# Patient Record
Sex: Male | Born: 1940 | Race: Black or African American | Hispanic: No | Marital: Single | Smoking: Former smoker
Health system: Southern US, Community
[De-identification: ages and names within clinical notes are randomized; demographics above are authoritative.]

## PROBLEM LIST (undated history)

## (undated) ENCOUNTER — Ambulatory Visit: Payer: BLUE CROSS/BLUE SHIELD

## (undated) ENCOUNTER — Encounter

## (undated) ENCOUNTER — Ambulatory Visit

## (undated) ENCOUNTER — Ambulatory Visit: Payer: MEDICARE

## (undated) ENCOUNTER — Telehealth

## (undated) ENCOUNTER — Encounter
Attending: Student in an Organized Health Care Education/Training Program | Primary: Student in an Organized Health Care Education/Training Program

## (undated) ENCOUNTER — Ambulatory Visit: Payer: Medicare (Managed Care)

## (undated) ENCOUNTER — Ambulatory Visit: Payer: 59

## (undated) ENCOUNTER — Emergency Department: Admission: EM | Payer: Medicare HMO | Source: Home / Self Care

## (undated) DIAGNOSIS — I8500 Esophageal varices without bleeding: Secondary | ICD-10-CM

## (undated) DIAGNOSIS — I10 Essential (primary) hypertension: Secondary | ICD-10-CM

## (undated) DIAGNOSIS — K219 Gastro-esophageal reflux disease without esophagitis: Secondary | ICD-10-CM

## (undated) DIAGNOSIS — K226 Gastro-esophageal laceration-hemorrhage syndrome: Secondary | ICD-10-CM

## (undated) DIAGNOSIS — D126 Benign neoplasm of colon, unspecified: Secondary | ICD-10-CM

## (undated) DIAGNOSIS — I509 Heart failure, unspecified: Secondary | ICD-10-CM

## (undated) DIAGNOSIS — I5020 Unspecified systolic (congestive) heart failure: Secondary | ICD-10-CM

## (undated) DIAGNOSIS — L309 Dermatitis, unspecified: Secondary | ICD-10-CM

## (undated) DIAGNOSIS — I428 Other cardiomyopathies: Secondary | ICD-10-CM

## (undated) DIAGNOSIS — E785 Hyperlipidemia, unspecified: Secondary | ICD-10-CM

## (undated) DIAGNOSIS — D649 Anemia, unspecified: Secondary | ICD-10-CM

## (undated) DIAGNOSIS — I2510 Atherosclerotic heart disease of native coronary artery without angina pectoris: Secondary | ICD-10-CM

## (undated) HISTORY — DX: Unspecified systolic (congestive) heart failure: I50.20

## (undated) HISTORY — DX: Other cardiomyopathies: I42.8

## (undated) HISTORY — PX: HERNIA REPAIR: SHX51

## (undated) HISTORY — PX: CARDIAC CATHETERIZATION: SHX172

## (undated) HISTORY — PX: REPLACEMENT TOTAL KNEE BILATERAL: SUR1225

## (undated) HISTORY — DX: Essential (primary) hypertension: I10

## (undated) HISTORY — PX: PROSTATE SURGERY: SHX751

## (undated) HISTORY — PX: JOINT REPLACEMENT: SHX530

## (undated) HISTORY — PX: ESOPHAGOGASTRODUODENOSCOPY: SHX1529

## (undated) HISTORY — PX: COLONOSCOPY: SHX174

## (undated) HISTORY — DX: Atherosclerotic heart disease of native coronary artery without angina pectoris: I25.10

---
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
## 2008-05-09 ENCOUNTER — Ambulatory Visit: Payer: Self-pay | Admitting: General Surgery

## 2009-08-25 ENCOUNTER — Ambulatory Visit: Payer: Self-pay | Admitting: Gastroenterology

## 2010-03-19 ENCOUNTER — Ambulatory Visit: Payer: Self-pay | Admitting: General Practice

## 2010-03-26 ENCOUNTER — Ambulatory Visit: Payer: Self-pay | Admitting: General Practice

## 2010-04-02 ENCOUNTER — Inpatient Hospital Stay: Payer: Self-pay | Admitting: General Practice

## 2010-04-04 ENCOUNTER — Encounter: Payer: Self-pay | Admitting: Internal Medicine

## 2010-09-13 ENCOUNTER — Ambulatory Visit: Payer: Self-pay | Admitting: General Practice

## 2010-09-26 ENCOUNTER — Inpatient Hospital Stay: Payer: Self-pay | Admitting: General Practice

## 2010-09-30 ENCOUNTER — Encounter: Payer: Self-pay | Admitting: Internal Medicine

## 2014-01-23 ENCOUNTER — Inpatient Hospital Stay: Payer: Self-pay | Admitting: Internal Medicine

## 2014-01-23 LAB — COMPREHENSIVE METABOLIC PANEL
Albumin: 3.5 g/dL (ref 3.4–5.0)
Alkaline Phosphatase: 79 U/L
Anion Gap: 11 (ref 7–16)
BUN: 25 mg/dL — ABNORMAL HIGH (ref 7–18)
Bilirubin,Total: 0.8 mg/dL (ref 0.2–1.0)
Calcium, Total: 8.8 mg/dL (ref 8.5–10.1)
Chloride: 104 mmol/L (ref 98–107)
Co2: 25 mmol/L (ref 21–32)
Creatinine: 1.24 mg/dL (ref 0.60–1.30)
EGFR (African American): >60
EGFR (Non-African Amer.): 59 — ABNORMAL LOW
Glucose: 146 mg/dL — ABNORMAL HIGH (ref 65–99)
Osmolality: 297 (ref 275–301)
Potassium: 3.4 mmol/L — ABNORMAL LOW (ref 3.5–5.1)
SGOT(AST): 28 U/L (ref 15–37)
SGPT (ALT): 35 U/L (ref 12–78)
Sodium: 140 mmol/L (ref 136–145)
Total Protein: 7.1 g/dL (ref 6.4–8.2)

## 2014-01-23 LAB — CBC
HCT: 42.7 % (ref 40.0–52.0)
HGB: 14.4 g/dL (ref 13.0–18.0)
MCH: 31 pg (ref 26.0–34.0)
MCHC: 33.8 g/dL (ref 32.0–36.0)
MCV: 92 fL (ref 80–100)
Platelet: 235 10*3/uL (ref 150–440)
RBC: 4.66 10*6/uL (ref 4.40–5.90)
RDW: 13.3 % (ref 11.5–14.5)
WBC: 13.6 10*3/uL — ABNORMAL HIGH (ref 3.8–10.6)

## 2014-01-23 LAB — PROTIME-INR
INR: 1
Prothrombin Time: 13.2 s

## 2014-01-23 LAB — ETHANOL
Ethanol %: <0.003 %
Ethanol: <3 mg/dL

## 2014-01-23 LAB — LIPASE, BLOOD: Lipase: 105 U/L

## 2014-01-23 LAB — HEMATOCRIT
HCT: 38.5 % — ABNORMAL LOW (ref 40.0–52.0)
HCT: 38.1 % — ABNORMAL LOW (ref 40.0–52.0)
HCT: 36.9 % — ABNORMAL LOW (ref 40.0–52.0)

## 2014-01-23 LAB — CK TOTAL AND CKMB (NOT AT ARMC)
CK, Total: 232 U/L (ref 35–232)
CK-MB: 2.6 ng/mL (ref 0.5–3.6)

## 2014-01-23 LAB — HEMOGLOBIN
HGB: 13.2 g/dL
HGB: 13.1 g/dL
HGB: 12.5 g/dL — ABNORMAL LOW

## 2014-01-23 LAB — APTT: Activated PTT: 33.7 secs (ref 23.6–35.9)

## 2014-01-23 LAB — TROPONIN I: Troponin-I: <0.02 ng/mL

## 2014-01-24 LAB — COMPREHENSIVE METABOLIC PANEL
Albumin: 2.8 g/dL — ABNORMAL LOW (ref 3.4–5.0)
Alkaline Phosphatase: 60 U/L
Anion Gap: 1 — ABNORMAL LOW (ref 7–16)
BUN: 21 mg/dL — ABNORMAL HIGH (ref 7–18)
Bilirubin,Total: 0.6 mg/dL (ref 0.2–1.0)
Calcium, Total: 8.2 mg/dL — ABNORMAL LOW (ref 8.5–10.1)
Chloride: 106 mmol/L (ref 98–107)
Co2: 30 mmol/L (ref 21–32)
Creatinine: 1.13 mg/dL (ref 0.60–1.30)
EGFR (African American): >60
EGFR (Non-African Amer.): >60
Glucose: 115 mg/dL — ABNORMAL HIGH (ref 65–99)
Osmolality: 278 (ref 275–301)
Potassium: 3.8 mmol/L (ref 3.5–5.1)
SGOT(AST): 28 U/L (ref 15–37)
SGPT (ALT): 23 U/L (ref 12–78)
Sodium: 137 mmol/L (ref 136–145)
Total Protein: 5.7 g/dL — ABNORMAL LOW (ref 6.4–8.2)

## 2014-01-24 LAB — CBC WITH DIFFERENTIAL/PLATELET
Basophil #: 0 10*3/uL (ref 0.0–0.1)
Basophil %: 0.7 %
Eosinophil #: 0.1 10*3/uL (ref 0.0–0.7)
Eosinophil %: 1.6 %
HCT: 32.4 % — ABNORMAL LOW (ref 40.0–52.0)
HGB: 11.2 g/dL — ABNORMAL LOW (ref 13.0–18.0)
Lymphocyte #: 1.2 10*3/uL (ref 1.0–3.6)
Lymphocyte %: 19.1 %
MCH: 31.8 pg (ref 26.0–34.0)
MCHC: 34.5 g/dL (ref 32.0–36.0)
MCV: 92 fL (ref 80–100)
Monocyte #: 0.6 x10 3/mm (ref 0.2–1.0)
Monocyte %: 9.6 %
Neutrophil #: 4.2 10*3/uL (ref 1.4–6.5)
Neutrophil %: 69 %
Platelet: 147 10*3/uL — ABNORMAL LOW (ref 150–440)
RBC: 3.51 10*6/uL — ABNORMAL LOW (ref 4.40–5.90)
RDW: 13.1 % (ref 11.5–14.5)
WBC: 6.1 10*3/uL (ref 3.8–10.6)

## 2014-01-24 LAB — PROTIME-INR
INR: 1.1
Prothrombin Time: 13.6 secs (ref 11.5–14.7)

## 2014-01-25 LAB — CBC WITH DIFFERENTIAL/PLATELET
Basophil #: 0 10*3/uL (ref 0.0–0.1)
Basophil %: 0.4 %
Eosinophil #: 0.1 10*3/uL (ref 0.0–0.7)
Eosinophil %: 1.6 %
HCT: 31.4 % — ABNORMAL LOW (ref 40.0–52.0)
HGB: 10.7 g/dL — ABNORMAL LOW (ref 13.0–18.0)
Lymphocyte #: 1.1 10*3/uL (ref 1.0–3.6)
Lymphocyte %: 18 %
MCH: 31.3 pg (ref 26.0–34.0)
MCHC: 34.2 g/dL (ref 32.0–36.0)
MCV: 92 fL (ref 80–100)
Monocyte #: 0.6 x10 3/mm (ref 0.2–1.0)
Monocyte %: 10.3 %
Neutrophil #: 4.2 10*3/uL (ref 1.4–6.5)
Neutrophil %: 69.7 %
Platelet: 145 10*3/uL — ABNORMAL LOW (ref 150–440)
RBC: 3.43 10*6/uL — ABNORMAL LOW (ref 4.40–5.90)
RDW: 13 % (ref 11.5–14.5)
WBC: 6.1 10*3/uL (ref 3.8–10.6)

## 2014-09-19 ENCOUNTER — Ambulatory Visit: Payer: Self-pay | Admitting: Gastroenterology

## 2015-04-15 NOTE — Consult Note (Signed)
Chief Complaint:  Subjective/Chief Complaint Pt denies any melena or emesis.  Denies nausea or abdominal pain.   VITAL SIGNS/ANCILLARY NOTES: **Vital Signs.:   02-Feb-15 08:00  Vital Signs Type Routine  Temperature Temperature (F) 97.7  Celsius 36.5  Temperature Source oral  Pulse Pulse 68  Respirations Respirations 18  Pulse Ox % Pulse Ox % 99  Pulse Ox Activity Level  At rest  Oxygen Delivery Room Air/ 21 %   Brief Assessment:  GEN well developed, well nourished, no acute distress, A/Ox3.   Cardiac Regular   Respiratory normal resp effort   Gastrointestinal details normal Soft  Nontender  Nondistended  Bowel sounds normal  No rebound tenderness  No gaurding   EXTR negative cyanosis/clubbing, negative edema   Additional Physical Exam Skin: warm, dry, no rash or jaundice   Lab Results:  Hepatic:  02-Feb-15 03:47   Bilirubin, Total 0.6  Alkaline Phosphatase 60 (45-117 NOTE: New Reference Range 11/12/13)  SGPT (ALT) 23  SGOT (AST) 28  Total Protein, Serum  5.7  Albumin, Serum  2.8  Routine Chem:  02-Feb-15 03:47   Glucose, Serum  115  BUN  21  Creatinine (comp) 1.13  Sodium, Serum 137  Potassium, Serum 3.8  Chloride, Serum 106  CO2, Serum 30  Calcium (Total), Serum  8.2  Osmolality (calc) 278  eGFR (African American) >60  eGFR (Non-African American) >60 (eGFR values <59m/min/1.73 m2 may be an indication of chronic kidney disease (CKD). Calculated eGFR is useful in patients with stable renal function. The eGFR calculation will not be reliable in acutely ill patients when serum creatinine is changing rapidly. It is not useful in  patients on dialysis. The eGFR calculation may not be applicable to patients at the low and high extremes of body sizes, pregnant women, and vegetarians.)  Anion Gap  1  Routine Coag:  02-Feb-15 03:47   Prothrombin 13.6  INR 1.1 (INR reference interval applies to patients on anticoagulant therapy. A single INR therapeutic range  for coumarins is not optimal for all indications; however, the suggested range for most indications is 2.0 - 3.0. Exceptions to the INR Reference Range may include: Prosthetic heart valves, acute myocardial infarction, prevention of myocardial infarction, and combinations of aspirin and anticoagulant. The need for a higher or lower target INR must be assessed individually. Reference: The Pharmacology and Management of the Vitamin K  antagonists: the seventh ACCP Conference on Antithrombotic and Thrombolytic Therapy. CFTDDU.2025Sept:126 (3suppl): [id number] A HCT value >55% may artifactually increase the PT.  In one study,  the increase was an average of 25%. Reference:  "Effect on Routine and Special Coagulation Testing Values of Citrate Anticoagulant Adjustment in Patients with High HCT Values." American Journal of Clinical Pathology 2006;126:400-405.)  Routine Hem:  02-Feb-15 03:47   WBC (CBC) 6.1  RBC (CBC)  3.51  Hemoglobin (CBC)  11.2  Hematocrit (CBC)  32.4  Platelet Count (CBC)  147  MCV 92  MCH 31.8  MCHC 34.5  RDW 13.1  Neutrophil % 69.0  Lymphocyte % 19.1  Monocyte % 9.6  Eosinophil % 1.6  Basophil % 0.7  Neutrophil # 4.2  Lymphocyte # 1.2  Monocyte # 0.6  Eosinophil # 0.1  Basophil # 0.0 (Result(s) reported on 24 Jan 2014 at 05:04AM.)   Assessment/Plan:  Assessment/Plan:  Assessment Mallory Weiss tear:  Hematemesis resolved Erosive esophagitis: On PPI Anemia: Hgb dropped 3g secondary to #1.  No futher bleeding, so should equilibrate   Plan 1) Protonix 48mBID  2) Advance diet as tolerated 3) Wean off octreotide, decreased to 12.76mg then DC Please call if you have any questions or concerns   Electronic Signatures: JAndria Meuse(NP)  (Signed 02-Feb-15 10:10)  Authored: Chief Complaint, VITAL SIGNS/ANCILLARY NOTES, Brief Assessment, Lab Results, Assessment/Plan   Last Updated: 02-Feb-15 10:10 by JAndria Meuse(NP)

## 2015-04-15 NOTE — Discharge Summary (Signed)
PATIENT NAME:  Earl Hays, Earl Hays MR#:  [id number] DATE OF BIRTH:  June 16, 1941  DATE OF ADMISSION:  01/23/2014 DATE OF DISCHARGE:  01/25/2014   DISCHARGE DIAGNOSES:  1. Mallory-Weiss tear and active esophagitis with upper gastrointestinal bleed.  2. Hypertension.  3. Hyperlipidemia.  4. Psoriasis.   DISCHARGE MEDICATIONS:  1. Protonix 40 mg b.i.d.  2. Carafate 1 gram q.a.c., t.i.d.   REASON FOR ADMISSION: A 74 year old male presents with upper GI bleed. Please see H and P for HPI, past medical history and physical exam.   HOSPITAL COURSE: The patient was admitted. Hemoglobin ultimately dropped down to 10.7. Upper endoscopy showed a Mallory-Weiss tear with active esophagitis. He was put on a Protonix IV drip with octreotide. His hemoglobin drifted down slowly over time. Overall stable. No evidence for recurrent bleeding future. In the future, he will use Tussionex p.r.n. coughing as he had had a respiratory illness with severe coughing prior to the incidence of upper GI bleed. He is stable for home. Follow up with Dr. Sabra Heck in 1 week. Overall prognosis is good.   ____________________________ Rusty Aus, MD mfm:lb D: 01/25/2014 07:26:42 ET T: 01/25/2014 09:38:11 ET JOB#: [id number]  cc: Rusty Aus, MD, <Dictator> Rusty Aus MD ELECTRONICALLY SIGNED 01/25/2014 11:02

## 2015-04-15 NOTE — Consult Note (Signed)
PATIENT NAME:  Earl Hays, LABRADOR MR#:  [id number] DATE OF BIRTH:  01-09-1941  DATE OF CONSULTATION:  01/23/2014  CONSULTING PHYSICIAN:  Lucilla Lame, MD CONSULTING SERVICE: Gastroenterology.   REASON FOR CONSULTATION: Hematemesis.   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 The patient reports that he had some upset stomach and took some Pepto-Bismol and started to vomit. He saw some pink material and thought it was the Pepto-Bismol and thought nothing of until later in the day when he had abdominal pain while he was at a baseball game and decided to go the bathroom where he vomited a large amount of blood. The patient was then brought to the hospital and had the ED report that he had a total of 800 mL of hematemesis. The patient's blood count on admission was 14.4, with this morning being 13.1. The patient has had no further vomiting or nausea since being admitted. There was a report from the Emergency Department that the patient may have had an extensive alcohol history. The patient reports that he drinks 2 to 3 beers on the weekend, but was a heavy drinker approximately 15 years ago. He has never had any signs or symptoms of upper gastrointestinal bleeding in the past. He also states that he has not been on any anti-inflammatory medication.   PAST MEDICAL HISTORY: Hypertension, psoriasis, hyperlipidemia, colon polyps.   HOME MEDICATIONS: Aspirin and Protonix.   ALLERGIES: TERBINAFINE.  SOCIAL HISTORY: Single, occasional drinking. He does report drinking beer extensively for approximately 10 years, but quit 15 years ago.   FAMILY HISTORY: Noncontributory.   REVIEW OF SYSTEMS: A 12-point review of systems negative except what was stated above.   PHYSICAL EXAMINATION: GENERAL: The patient is lying in bed in no apparent distress. Denies any further nausea. Has been feeling well  since admission, answering questions appropriately, in full sentences.   VITAL SIGNS: Temperature 98.4, pulse 96, respirations 13, blood pressure 134/92, pulse oximetry 96% on 2 liters.  HEENT: Normocephalic, atraumatic. Extraocular motor intact. Pupils equally round and reactive to light and accommodation without JVD, without lymphadenopathy.  LUNGS: Clear to auscultation bilaterally.  HEART: Regular rate and rhythm without murmurs, rubs or gallops.  ABDOMEN: Soft, nontender, nondistended, without hepatosplenomegaly.  EXTREMITIES: Without cyanosis, clubbing or edema.   SKIN: Without any rashes or lesions.  PSYCHIATRIC: Alert and orientated.  NEUROLOGICAL: Cranial nerves II through XII intact.   ANCILLARY SERVICES: As stated above.   IMPRESSION: This patient came in with hematemesis and what appears to be an upper gastrointestinal bleed. The patient has been set up for an upper endoscopy which showed a Mallory-Weiss tear without any signs of varices or portal hypertension on the upper endoscopy. The patient should have his octreotide weaned off. He should also try to avoid any further bleeding. The patient will be followed during his hospital stay here.   Thank you very much for involving me in the care of this patient. If you have any questions, please do not hesitate to call.   ____________________________ Lucilla Lame, MD dw:sg D: 01/23/2014 11:59:03 ET T: 01/23/2014 14:28:00 ET JOB#: [id number]  cc: Lucilla Lame, MD, <Dictator> Lucilla Lame MD ELECTRONICALLY SIGNED 01/27/2014 6:21

## 2015-04-15 NOTE — H&P (Signed)
PATIENT NAME:  Earl Hays, Earl Hays MR#:  [id number] DATE OF BIRTH:  [birth year]  DATE OF ADMISSION:  01/23/2014  PRIMARY CARE PHYSICIAN:  Dr. Emily Filbert.   REQUESTING PHYSICIAN:  Dr. Beather Arbour.   CHIEF COMPLAINT:  Gastrointestinal bleed/active hematemesis.   HISTORY OF PRESENT ILLNESS:  The patient is a 74 year old male with a known history of hypertension, is being admitted for active hematemesis.  The patient started having vomiting of bright red blood yesterday evening around 6:00 p.m.  He was having some funny feeling in the stomach, took Pepto-Bismol, within 15 to 20 minutes he started having bright red blood/pinkish vomiting.  He ignored it and went to his basketball game, but started feeling dizzy and had some hot flashes, was also feeling nauseous, so cut short and came back home, went straight to his bathroom as he was feeling like he was going to throw up, which he did, had two vomiting with blood and he decided to come to the Emergency Department.  While in the ED, he had another episode of dark red blood as per nursing about a total of 800 mL of blood was noted and he is being admitted for further evaluation and management.   PAST MEDICAL HISTORY: 1.  Hypertension.  2.  Psoriasis. 3.  Hyperlipidemia.  4.  Colon adenomas.   PAST SURGICAL HISTORY:  1.  Right inguinal hernia.  2.  Left inguinal hernia.  3.  Right and left total knee replacement.   MEDICATIONS AT HOME: 1.  Aspirin 81 mg by mouth daily.  2.  Protonix 40 mg by mouth daily.   ALLERGIES:  Terbinafine HCL.   SOCIAL HISTORY:  He is single, divorced x 2.  He used to work at [employer], now is retired.  He also used to play baseball.  He does not drink much now, only occasional drink, but he used to drink a 6 pack of beer every night for the last 30 to 40 years which he has been cutting off for the last few years.  No smoking.  No IV drugs of abuse.  FAMILY HISTORY:  Mother died of brain tumor.   REVIEW OF SYSTEMS:   CONSTITUTIONAL:  No fever.  Positive fatigue and weakness.  EYES:  No blurred or double vision.  EARS, NOSE, THROAT:  No tinnitus or ear pain.  RESPIRATORY:  No cough or hemoptysis.  CARDIOVASCULAR:  No chest pain, orthopnea, edema.  GASTROINTESTINAL:  Positive for nausea and vomiting bright red blood/hematemesis.  No abdominal pain.  No diarrhea.   GENITOURINARY:  No dysuria or hematuria.  ENDOCRINE:  No polyuria, nocturia.  HEMATOLOGY:  No anemia or easy bruising.  SKIN:  No rash or lesion.  MUSCULOSKELETAL:  No arthritis or muscle cramp.  NEUROLOGIC:  No tingling or numbness.  Positive for dizziness.  PSYCHIATRIC:  No history of anxiety or depression.   PHYSICAL EXAMINATION: VITAL SIGNS:  Temperature 98.1, heart rate 115 per minute, respirations 20 per minute, blood pressure 186/108 mmHg.  He was saturating 100% on room air.  GENERAL:  The patient is a 74 year old young male lying in the bed comfortably without any acute distress.  EYES:  Pupils equal, round, reactive to light and accommodation.  NO scleral icterus.  Extraocular muscles intact.  HENT:  Head atraumatic, normocephalic.  Oropharynx and nasopharynx clear.  NECK:  Supple.  No jugular venous distention.  No thyroid enlargement or tenderness.  LUNGS:  Clear to auscultation bilaterally.  No wheezing, rales, rhonchi or crepitation.  CARDIOVASCULAR:  S1, S2 normal, tachycardic.  No murmur, rubs or gallop.  ABDOMEN:  Soft, nontender, nondistended.  Bowel sounds present.  No organomegaly or mass.  EXTREMITIES:  No pedal edema, cyanosis or clubbing.  NEUROLOGIC:  Nonfocal examination.  Cranial nerves II through XII intact.  Muscle strength 5 out of 5 in all extremities.  Sensation intact.  PSYCHIATRIC:  The patient is alert and oriented x 3.  SKIN:  No obvious rash, lesion, ulcer.   LABORATORY DATA:  Normal BMP.  Normal liver function tests.  Normal first set of cardiac enzymes.  Normal CBC except white count of 13.6.  Normal  coagulation panel.   Chest x-ray in the ED showed no acute cardiopulmonary disease.   IMPRESSION AND PLAN: 1.  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  We will consult gastroenterology as an urgent consult, discussed with Dr. Lucilla Lame.  We will keep him nothing by mouth.  Start him on IV Protonix drip and octreotide drip.  Hold his aspirin.  Monitor his hemoglobin and hematocrit every six hours.  He will be monitored in the Critical Care Unit stepdown unit.  2.  Hypertension.  We will monitor his blood pressure closely and start him on some medication as needed.  For now we will start him on metoprolol as he is tachycardic and also hypertensive.  3.  CODE STATUS:  FULL CODE.   Total time taking care of this patient (critical care) 55 minutes.   He remains at very high risk for rebleeding and hemodynamic decompensation.    ____________________________ Lucina Mellow. Manuella Ghazi, MD vss:ea D: 01/23/2014 02:19:37 ET T: 01/23/2014 04:05:04 ET JOB#: [id number]  cc: Trenita Hulme S. Manuella Ghazi, MD, <Dictator> Rusty Aus, MD Lucina Mellow [facility] MD ELECTRONICALLY SIGNED 01/26/2014 16:39

## 2015-04-15 NOTE — Consult Note (Signed)
Brief Consult Note: Diagnosis: This patient came in with vomiting blood since yesterday.The patient has a hsitory of etoh abuse about 15 years ago but now only has about 3 beers a weekend.   Patient was seen by consultant.   Consult note dictated.   Comments: Upper GI bleed. The patient will be set up for a EGD today. He is already on a PPI and somatostatin.  Electronic Signatures: Lucilla Lame (MD)  (Signed 01-Feb-15 07:41)  Authored: Brief Consult Note   Last Updated: 01-Feb-15 07:41 by Lucilla Lame (MD)

## 2016-06-10 DIAGNOSIS — E782 Mixed hyperlipidemia: Secondary | ICD-10-CM | POA: Diagnosis not present

## 2016-06-10 DIAGNOSIS — D510 Vitamin B12 deficiency anemia due to intrinsic factor deficiency: Secondary | ICD-10-CM | POA: Diagnosis not present

## 2016-06-10 DIAGNOSIS — Z125 Encounter for screening for malignant neoplasm of prostate: Secondary | ICD-10-CM | POA: Diagnosis not present

## 2016-06-10 DIAGNOSIS — Z0000 Encounter for general adult medical examination without abnormal findings: Secondary | ICD-10-CM | POA: Diagnosis not present

## 2016-06-17 DIAGNOSIS — Z0000 Encounter for general adult medical examination without abnormal findings: Secondary | ICD-10-CM | POA: Diagnosis not present

## 2016-06-17 DIAGNOSIS — D510 Vitamin B12 deficiency anemia due to intrinsic factor deficiency: Secondary | ICD-10-CM | POA: Diagnosis not present

## 2017-01-15 ENCOUNTER — Encounter: Payer: Self-pay | Admitting: *Deleted

## 2017-01-21 ENCOUNTER — Ambulatory Visit
Admission: RE | Admit: 2017-01-21 | Discharge: 2017-01-21 | Disposition: A | Discharge status: Home / Self Care | Payer: Medicare PPO | Source: Ambulatory Visit | Attending: Ophthalmology | Admitting: Ophthalmology

## 2017-01-21 ENCOUNTER — Ambulatory Visit: Payer: Medicare PPO | Admitting: Anesthesiology

## 2017-01-21 ENCOUNTER — Encounter: Payer: Self-pay | Admitting: *Deleted

## 2017-01-21 ENCOUNTER — Encounter
Admission: RE | Disposition: A | Discharge status: Home / Self Care | Payer: Self-pay | Source: Ambulatory Visit | Attending: Ophthalmology

## 2017-01-21 DIAGNOSIS — Z87891 Personal history of nicotine dependence: Secondary | ICD-10-CM | POA: Diagnosis not present

## 2017-01-21 DIAGNOSIS — K219 Gastro-esophageal reflux disease without esophagitis: Secondary | ICD-10-CM | POA: Diagnosis not present

## 2017-01-21 DIAGNOSIS — I8500 Esophageal varices without bleeding: Secondary | ICD-10-CM | POA: Insufficient documentation

## 2017-01-21 DIAGNOSIS — H2512 Age-related nuclear cataract, left eye: Secondary | ICD-10-CM | POA: Diagnosis not present

## 2017-01-21 HISTORY — DX: Gastro-esophageal reflux disease without esophagitis: K21.9

## 2017-01-21 HISTORY — PX: CATARACT EXTRACTION W/PHACO: SHX586

## 2017-01-21 HISTORY — DX: Esophageal varices without bleeding: I85.00

## 2017-01-21 SURGERY — PHACOEMULSIFICATION, CATARACT, WITH IOL INSERTION
Anesthesia: Monitor Anesthesia Care | Site: Eye | Laterality: Left | Wound class: Clean

## 2017-01-21 MED ORDER — FENTANYL CITRATE (PF) 100 MCG/2ML IJ SOLN
INTRAMUSCULAR | Status: AC
  Filled 2017-01-21: qty 2

## 2017-01-21 MED ORDER — ARMC OPHTHALMIC DILATING DROPS
1.0000 "application " | OPHTHALMIC | Status: AC
  Administered 2017-01-21 (×2): 1 via OPHTHALMIC

## 2017-01-21 MED ORDER — LIDOCAINE HCL (PF) 4 % IJ SOLN
INTRAOCULAR | Status: DC | PRN
  Administered 2017-01-21: 2.25 mL via OPHTHALMIC

## 2017-01-21 MED ORDER — NA CHONDROIT SULF-NA HYALURON 40-17 MG/ML IO SOLN
INTRAOCULAR | Status: DC | PRN
  Administered 2017-01-21: 1 mL via INTRAOCULAR

## 2017-01-21 MED ORDER — MOXIFLOXACIN HCL 0.5 % OP SOLN: 1.0000 [drp] | OPHTHALMIC | Status: DC | PRN

## 2017-01-21 MED ORDER — MIDAZOLAM HCL 2 MG/2ML IJ SOLN
INTRAMUSCULAR | Status: AC
  Filled 2017-01-21: qty 2

## 2017-01-21 MED ORDER — CARBACHOL 0.01 % IO SOLN
INTRAOCULAR | Status: DC | PRN
  Administered 2017-01-21: .5 mL via INTRAOCULAR

## 2017-01-21 MED ORDER — FENTANYL CITRATE (PF) 100 MCG/2ML IJ SOLN
INTRAMUSCULAR | Status: DC | PRN
  Administered 2017-01-21 (×2): 25 ug via INTRAVENOUS

## 2017-01-21 MED ORDER — SODIUM CHLORIDE 0.9 % IV SOLN
INTRAVENOUS | Status: DC
  Administered 2017-01-21: 10:00:00 via INTRAVENOUS

## 2017-01-21 MED ORDER — EPINEPHRINE PF 1 MG/ML IJ SOLN
INTRAOCULAR | Status: DC | PRN
  Administered 2017-01-21: 1 mL via OPHTHALMIC

## 2017-01-21 MED ORDER — EPINEPHRINE PF 1 MG/ML IJ SOLN
INTRAMUSCULAR | Status: AC
  Filled 2017-01-21: qty 1

## 2017-01-21 MED ORDER — LIDOCAINE HCL (PF) 4 % IJ SOLN
INTRAMUSCULAR | Status: AC
  Filled 2017-01-21: qty 5

## 2017-01-21 MED ORDER — POVIDONE-IODINE 5 % OP SOLN
OPHTHALMIC | Status: AC
  Filled 2017-01-21: qty 30

## 2017-01-21 MED ORDER — MOXIFLOXACIN HCL 0.5 % OP SOLN
OPHTHALMIC | Status: AC
  Filled 2017-01-21: qty 3

## 2017-01-21 MED ORDER — MOXIFLOXACIN HCL 0.5 % OP SOLN
OPHTHALMIC | Status: DC | PRN
  Administered 2017-01-21: .2 mL via OPHTHALMIC

## 2017-01-21 MED ORDER — NA CHONDROIT SULF-NA HYALURON 40-17 MG/ML IO SOLN
INTRAOCULAR | Status: AC
Start: 2017-01-21 — End: 2017-01-21
  Filled 2017-01-21: qty 1

## 2017-01-21 MED ORDER — MIDAZOLAM HCL 2 MG/2ML IJ SOLN
INTRAMUSCULAR | Status: DC | PRN
  Administered 2017-01-21: 1 mg via INTRAVENOUS

## 2017-01-21 MED ORDER — ARMC OPHTHALMIC DILATING DROPS
OPHTHALMIC | Status: AC
  Administered 2017-01-21: 1
  Filled 2017-01-21: qty 0.4

## 2017-01-21 SURGICAL SUPPLY — 21 items
CANNULA ANT/CHMB 27GA (MISCELLANEOUS) ×3 IMPLANT
CUP MEDICINE 2OZ PLAST GRAD ST (MISCELLANEOUS) ×3 IMPLANT
GLOVE BIO SURGEON STRL SZ8 (GLOVE) ×3 IMPLANT
GLOVE BIOGEL M 6.5 STRL (GLOVE) ×3 IMPLANT
GLOVE SURG LX 8.0 MICRO (GLOVE) ×2
GLOVE SURG LX STRL 8.0 MICRO (GLOVE) ×1 IMPLANT
GOWN STRL REUS W/ TWL LRG LVL3 (GOWN DISPOSABLE) ×2 IMPLANT
GOWN STRL REUS W/TWL LRG LVL3 (GOWN DISPOSABLE) ×6
LENS IOL TECNIS ITEC 20.0 (Intraocular Lens) ×3 IMPLANT
PACK CATARACT (MISCELLANEOUS) ×3 IMPLANT
PACK CATARACT BRASINGTON LX (MISCELLANEOUS) ×3 IMPLANT
PACK EYE AFTER SURG (MISCELLANEOUS) ×3 IMPLANT
SOL BSS BAG (MISCELLANEOUS) ×3
SOL PREP PVP 2OZ (MISCELLANEOUS) ×3
SOLUTION BSS BAG (MISCELLANEOUS) ×1 IMPLANT
SOLUTION PREP PVP 2OZ (MISCELLANEOUS) ×1 IMPLANT
SYR 3ML LL SCALE MARK (SYRINGE) ×3 IMPLANT
SYR 5ML LL (SYRINGE) ×3 IMPLANT
SYR TB 1ML 27GX1/2 LL (SYRINGE) ×3 IMPLANT
WATER STERILE IRR 250ML POUR (IV SOLUTION) ×3 IMPLANT
WIPE NON LINTING 3.25X3.25 (MISCELLANEOUS) ×3 IMPLANT

## 2017-01-21 NOTE — H&P (Signed)
All labs reviewed. Abnormal studies sent to patients PCP when indicated.  Previous H&P reviewed, patient examined, there are NO CHANGES.  Kahner Yanik LOUIS1/30/201810:29 AM

## 2017-01-21 NOTE — Op Note (Signed)
PREOPERATIVE DIAGNOSIS:  Nuclear sclerotic cataract of the left eye.   POSTOPERATIVE DIAGNOSIS:  Nuclear sclerotic cataract of the left eye.   OPERATIVE PROCEDURE: Procedure(s): CATARACT EXTRACTION PHACO AND INTRAOCULAR LENS PLACEMENT (IOC)   SURGEON:  Birder Robson, MD.   ANESTHESIA:  Anesthesiologist: Andria Frames, MD CRNA: Hedda Slade, CRNA  1.      Managed anesthesia care. 2.     0.93ml of Shugarcaine was instilled following the paracentesis   COMPLICATIONS:  None.   TECHNIQUE:   Stop and chop   DESCRIPTION OF PROCEDURE:  The patient was examined and consented in the preoperative holding area where the aforementioned topical anesthesia was applied to the left eye and then brought back to the Operating Room where the left eye was prepped and draped in the usual sterile ophthalmic fashion and a lid speculum was placed. A paracentesis was created with the side port blade and the anterior chamber was filled with viscoelastic. A near clear corneal incision was performed with the steel keratome. A continuous curvilinear capsulorrhexis was performed with a cystotome followed by the capsulorrhexis forceps. Hydrodissection and hydrodelineation were carried out with BSS on a blunt cannula. The lens was removed in a stop and chop  technique and the remaining cortical material was removed with the irrigation-aspiration handpiece. The capsular bag was inflated with viscoelastic and the Technis [id number] lens was placed in the capsular bag without complication. The remaining viscoelastic was removed from the eye with the irrigation-aspiration handpiece. The wounds were hydrated. The anterior chamber was flushed with Miostat and the eye was inflated to physiologic pressure. 0.75ml Vigamox was placed in the anterior chamber. The wounds were found to be water tight. The eye was dressed with Vigamox. The patient was given protective glasses to wear throughout the day and a shield with which to sleep  tonight. The patient was also given drops with which to begin a drop regimen today and will follow-up with me in one day.  Implant Name Type Inv. Item Serial No. Manufacturer Lot No. LRB No. Used  LENS IOL DIOP 20.0 - [id number] Intraocular Lens LENS IOL DIOP 20.0 [id number]   Left 1    Procedure(s) with comments: CATARACT EXTRACTION PHACO AND INTRAOCULAR LENS PLACEMENT (IOC) (Left) - [place] 00:48 AP% 21.6 CDE 10.39 Fluid pack lot # [id number]  Electronically signed: Bell 01/21/2017 11:02 AM

## 2017-01-21 NOTE — Anesthesia Post-op Follow-up Note (Cosign Needed)
Anesthesia QCDR form completed.        

## 2017-01-21 NOTE — Transfer of Care (Signed)
Immediate Anesthesia Transfer of Care Note  Patient: Earl Hays  Procedure(s) Performed: Procedure(s) with comments: CATARACT EXTRACTION PHACO AND INTRAOCULAR LENS PLACEMENT (IOC) (Left) - [place] 00:48 AP% 21.6 CDE 10.39 Fluid pack lot # [id number]  Patient Location: PACU  Anesthesia Type:MAC  Level of Consciousness: awake, alert  and oriented  Airway & Oxygen Therapy: Patient Spontanous Breathing  Post-op Assessment: Report given to RN and Post -op Vital signs reviewed and stable  Post vital signs: Reviewed and stable  Last Vitals:  Vitals:   01/21/17 0927 01/21/17 1103  BP: (!) 186/85 (!) 165/96  Pulse: 71 81  Resp: 18   Temp: 36.6 C 36.3 C    Last Pain:  Vitals:   01/21/17 1103  TempSrc: Temporal         Complications: No apparent anesthesia complications

## 2017-01-21 NOTE — Anesthesia Procedure Notes (Signed)
Procedure Name: MAC Date/Time: 01/21/2017 10:37 AM Performed by: Hedda Slade Pre-anesthesia Checklist: Patient identified, Emergency Drugs available, Suction available and Patient being monitored Oxygen Delivery Method: Nasal cannula

## 2017-01-21 NOTE — Anesthesia Preprocedure Evaluation (Signed)
Anesthesia Evaluation  Patient identified by MRN, date of birth, ID band Patient awake    Reviewed: Allergy & Precautions, H&P , NPO status , Patient's Chart, lab work & pertinent test results  Airway Mallampati: III  TM Distance: >3 FB Neck ROM: limited    Dental  (+) Poor Dentition, Missing, Upper Dentures, Lower Dentures   Pulmonary neg shortness of breath, former smoker,    Pulmonary exam normal breath sounds clear to auscultation       Cardiovascular Exercise Tolerance: Good (-) angina(-) Past MI and (-) DOE negative cardio ROS Normal cardiovascular exam Rhythm:regular Rate:Normal     Neuro/Psych negative neurological ROS  negative psych ROS   GI/Hepatic Neg liver ROS, GERD  Controlled,  Endo/Other  negative endocrine ROS  Renal/GU      Musculoskeletal   Abdominal   Peds  Hematology negative hematology ROS (+)   Anesthesia Other Findings Signs and symptoms suggestive of sleep apnea   Past Medical History: No date: GERD (gastroesophageal reflux disease) No date: Varices, esophageal (HCC)  Past Surgical History: No date: COLONOSCOPY No date: JOINT REPLACEMENT     Comment: TKR No date: REPLACEMENT TOTAL KNEE BILATERAL Bilateral  BMI    Body Mass Index:  25.54 kg/m      Reproductive/Obstetrics negative OB ROS                             Anesthesia Physical Anesthesia Plan  ASA: III  Anesthesia Plan: MAC   Post-op Pain Management:    Induction:   Airway Management Planned:   Additional Equipment:   Intra-op Plan:   Post-operative Plan:   Informed Consent: I have reviewed the patients History and Physical, chart, labs and discussed the procedure including the risks, benefits and alternatives for the proposed anesthesia with the patient or authorized representative who has indicated his/her understanding and acceptance.     Plan Discussed with: Anesthesiologist,  CRNA and Surgeon  Anesthesia Plan Comments:         Anesthesia Quick Evaluation

## 2017-01-21 NOTE — Discharge Instructions (Signed)
Eye Surgery Discharge Instructions  Expect mild scratchy sensation or mild soreness. DO NOT RUB YOUR EYE!  The day of surgery:  Minimal physical activity, but bed rest is not required  No reading, computer work, or close hand work  No bending, lifting, or straining.  May watch TV  For 24 hours:  No driving, legal decisions, or alcoholic beverages  Safety precautions  Eat anything you prefer: It is better to start with liquids, then soup then solid foods.  _____ Eye patch should be worn until postoperative exam tomorrow.  ____ Solar shield eyeglasses should be worn for comfort in the sunlight/patch while sleeping  Resume all regular medications including aspirin or Coumadin if these were discontinued prior to surgery. You may shower, bathe, shave, or wash your hair. Tylenol may be taken for mild discomfort.  Call your doctor if you experience significant pain, nausea, or vomiting, fever > 101 or other signs of infection. [phone] or [phone] Specific instructions:  Follow-up Information    PORFILIO,WILLIAM LOUIS, MD Follow up.   Specialty:  Ophthalmology Why:  01-22-17 at 9:10 Contact information: [address] [place] [address] [phone]          Eye Surgery Discharge Instructions  Expect mild scratchy sensation or mild soreness. DO NOT RUB YOUR EYE!  The day of surgery:  Minimal physical activity, but bed rest is not required  No reading, computer work, or close hand work  No bending, lifting, or straining.  May watch TV  For 24 hours:  No driving, legal decisions, or alcoholic beverages  Safety precautions  Eat anything you prefer: It is better to start with liquids, then soup then solid foods.  _____ Eye patch should be worn until postoperative exam tomorrow.  ____ Solar shield eyeglasses should be worn for comfort in the sunlight/patch while sleeping  Resume all regular medications including aspirin or Coumadin if these  were discontinued prior to surgery. You may shower, bathe, shave, or wash your hair. Tylenol may be taken for mild discomfort.  Call your doctor if you experience significant pain, nausea, or vomiting, fever > 101 or other signs of infection. [phone] or [phone] Specific instructions:  Follow-up Information    PORFILIO,WILLIAM LOUIS, MD Follow up.   Specialty:  Ophthalmology Why:  01-22-17 at 9:10 Contact information: [address] [place] [address] [phone]

## 2017-01-21 NOTE — Anesthesia Postprocedure Evaluation (Signed)
Anesthesia Post Note  Patient: Earl Hays  Procedure(s) Performed: Procedure(s) (LRB): CATARACT EXTRACTION PHACO AND INTRAOCULAR LENS PLACEMENT (IOC) (Left)  Patient location during evaluation: PACU Anesthesia Type: MAC Level of consciousness: awake, awake and alert and oriented Pain management: pain level controlled Vital Signs Assessment: post-procedure vital signs reviewed and stable Respiratory status: spontaneous breathing Cardiovascular status: blood pressure returned to baseline Postop Assessment: no signs of nausea or vomiting Anesthetic complications: no     Last Vitals:  Vitals:   01/21/17 0927 01/21/17 1103  BP: (!) 186/85 (!) 165/96  Pulse: 71 81  Resp: 18   Temp: 36.6 C 36.3 C    Last Pain:  Vitals:   01/21/17 1103  TempSrc: Temporal                 Maeven Mcdougall Lorenza Chick

## 2017-02-03 ENCOUNTER — Encounter: Payer: Self-pay | Admitting: *Deleted

## 2017-02-11 ENCOUNTER — Ambulatory Visit
Admission: RE | Admit: 2017-02-11 | Discharge: 2017-02-11 | Disposition: A | Discharge status: Home / Self Care | Payer: Medicare PPO | Source: Ambulatory Visit | Attending: Ophthalmology | Admitting: Ophthalmology

## 2017-02-11 ENCOUNTER — Ambulatory Visit: Payer: Medicare PPO | Admitting: Anesthesiology

## 2017-02-11 ENCOUNTER — Encounter
Admission: RE | Disposition: A | Discharge status: Home / Self Care | Payer: Self-pay | Source: Ambulatory Visit | Attending: Ophthalmology

## 2017-02-11 ENCOUNTER — Encounter: Payer: Self-pay | Admitting: *Deleted

## 2017-02-11 DIAGNOSIS — I8500 Esophageal varices without bleeding: Secondary | ICD-10-CM | POA: Diagnosis not present

## 2017-02-11 DIAGNOSIS — Z87891 Personal history of nicotine dependence: Secondary | ICD-10-CM | POA: Insufficient documentation

## 2017-02-11 DIAGNOSIS — H2511 Age-related nuclear cataract, right eye: Secondary | ICD-10-CM | POA: Insufficient documentation

## 2017-02-11 DIAGNOSIS — K219 Gastro-esophageal reflux disease without esophagitis: Secondary | ICD-10-CM | POA: Insufficient documentation

## 2017-02-11 HISTORY — PX: CATARACT EXTRACTION W/PHACO: SHX586

## 2017-02-11 SURGERY — PHACOEMULSIFICATION, CATARACT, WITH IOL INSERTION
Anesthesia: Monitor Anesthesia Care | Site: Eye | Laterality: Right | Wound class: Clean

## 2017-02-11 MED ORDER — NA CHONDROIT SULF-NA HYALURON 40-17 MG/ML IO SOLN
INTRAOCULAR | Status: DC | PRN
  Administered 2017-02-11: 1 mL via INTRAOCULAR

## 2017-02-11 MED ORDER — POVIDONE-IODINE 5 % OP SOLN
OPHTHALMIC | Status: AC
  Filled 2017-02-11: qty 30

## 2017-02-11 MED ORDER — SODIUM CHLORIDE 0.9 % IV SOLN
INTRAVENOUS | Status: DC
  Administered 2017-02-11: 09:00:00 via INTRAVENOUS

## 2017-02-11 MED ORDER — MIDAZOLAM HCL 2 MG/2ML IJ SOLN
INTRAMUSCULAR | Status: DC | PRN
  Administered 2017-02-11: 1 mg via INTRAVENOUS

## 2017-02-11 MED ORDER — NA CHONDROIT SULF-NA HYALURON 40-17 MG/ML IO SOLN
INTRAOCULAR | Status: AC
  Filled 2017-02-11: qty 1

## 2017-02-11 MED ORDER — MOXIFLOXACIN HCL 0.5 % OP SOLN
OPHTHALMIC | Status: AC
  Filled 2017-02-11: qty 3

## 2017-02-11 MED ORDER — ARMC OPHTHALMIC DILATING DROPS
OPHTHALMIC | Status: AC
  Filled 2017-02-11: qty 0.4

## 2017-02-11 MED ORDER — EPINEPHRINE PF 1 MG/ML IJ SOLN
INTRAOCULAR | Status: DC | PRN
  Administered 2017-02-11: 1 mL via OPHTHALMIC

## 2017-02-11 MED ORDER — CARBACHOL 0.01 % IO SOLN
INTRAOCULAR | Status: DC | PRN
  Administered 2017-02-11: .5 mL via INTRAOCULAR

## 2017-02-11 MED ORDER — MIDAZOLAM HCL 2 MG/2ML IJ SOLN
INTRAMUSCULAR | Status: AC
  Filled 2017-02-11: qty 2

## 2017-02-11 MED ORDER — EPINEPHRINE PF 1 MG/ML IJ SOLN
INTRAMUSCULAR | Status: AC
  Filled 2017-02-11: qty 2

## 2017-02-11 MED ORDER — MOXIFLOXACIN HCL 0.5 % OP SOLN: 1.0000 [drp] | OPHTHALMIC | Status: DC | PRN

## 2017-02-11 MED ORDER — FENTANYL CITRATE (PF) 100 MCG/2ML IJ SOLN
INTRAMUSCULAR | Status: DC | PRN
  Administered 2017-02-11 (×2): 25 ug via INTRAVENOUS

## 2017-02-11 MED ORDER — FENTANYL CITRATE (PF) 100 MCG/2ML IJ SOLN
INTRAMUSCULAR | Status: AC
  Filled 2017-02-11: qty 2

## 2017-02-11 MED ORDER — LIDOCAINE HCL (PF) 4 % IJ SOLN
INTRAOCULAR | Status: DC | PRN
  Administered 2017-02-11: 2.25 mL via OPHTHALMIC

## 2017-02-11 MED ORDER — MOXIFLOXACIN HCL 0.5 % OP SOLN
OPHTHALMIC | Status: DC | PRN
  Administered 2017-02-11: .2 mL via OPHTHALMIC

## 2017-02-11 MED ORDER — ARMC OPHTHALMIC DILATING DROPS
1.0000 "application " | OPHTHALMIC | Status: AC
  Administered 2017-02-11 (×3): 1 via OPHTHALMIC

## 2017-02-11 SURGICAL SUPPLY — 21 items
CANNULA ANT/CHMB 27GA (MISCELLANEOUS) ×3 IMPLANT
CUP MEDICINE 2OZ PLAST GRAD ST (MISCELLANEOUS) ×3 IMPLANT
GLOVE BIO SURGEON STRL SZ8 (GLOVE) ×3 IMPLANT
GLOVE BIOGEL M 6.5 STRL (GLOVE) ×3 IMPLANT
GLOVE SURG LX 8.0 MICRO (GLOVE) ×2
GLOVE SURG LX STRL 8.0 MICRO (GLOVE) ×1 IMPLANT
GOWN STRL REUS W/ TWL LRG LVL3 (GOWN DISPOSABLE) ×2 IMPLANT
GOWN STRL REUS W/TWL LRG LVL3 (GOWN DISPOSABLE) ×6
LENS IOL TECNIS ITEC 20.0 (Intraocular Lens) ×3 IMPLANT
PACK CATARACT (MISCELLANEOUS) ×3 IMPLANT
PACK CATARACT BRASINGTON LX (MISCELLANEOUS) ×3 IMPLANT
PACK EYE AFTER SURG (MISCELLANEOUS) ×3 IMPLANT
SOL BSS BAG (MISCELLANEOUS) ×3
SOL PREP PVP 2OZ (MISCELLANEOUS) ×3
SOLUTION BSS BAG (MISCELLANEOUS) ×1 IMPLANT
SOLUTION PREP PVP 2OZ (MISCELLANEOUS) ×1 IMPLANT
SYR 3ML LL SCALE MARK (SYRINGE) ×3 IMPLANT
SYR 5ML LL (SYRINGE) ×3 IMPLANT
SYR TB 1ML 27GX1/2 LL (SYRINGE) ×3 IMPLANT
WATER STERILE IRR 250ML POUR (IV SOLUTION) ×3 IMPLANT
WIPE NON LINTING 3.25X3.25 (MISCELLANEOUS) ×3 IMPLANT

## 2017-02-11 NOTE — Anesthesia Preprocedure Evaluation (Signed)
Anesthesia Evaluation  Patient identified by MRN, date of birth, ID band Patient awake    Reviewed: Allergy & Precautions, H&P , NPO status , Patient's Chart, lab work & pertinent test results  Airway Mallampati: III  TM Distance: >3 FB Neck ROM: limited    Dental  (+) Poor Dentition, Missing, Upper Dentures, Lower Dentures   Pulmonary neg shortness of breath, former smoker,    Pulmonary exam normal breath sounds clear to auscultation       Cardiovascular Exercise Tolerance: Good (-) angina(-) Past MI and (-) DOE negative cardio ROS Normal cardiovascular exam Rhythm:regular Rate:Normal     Neuro/Psych negative neurological ROS  negative psych ROS   GI/Hepatic Neg liver ROS, GERD  Controlled,  Endo/Other  negative endocrine ROS  Renal/GU      Musculoskeletal   Abdominal   Peds  Hematology negative hematology ROS (+)   Anesthesia Other Findings Signs and symptoms suggestive of sleep apnea   Past Medical History: No date: GERD (gastroesophageal reflux disease) No date: Varices, esophageal (HCC)  Past Surgical History: No date: COLONOSCOPY No date: JOINT REPLACEMENT     Comment: TKR No date: REPLACEMENT TOTAL KNEE BILATERAL Bilateral  BMI    Body Mass Index:  25.54 kg/m      Reproductive/Obstetrics negative OB ROS                             Anesthesia Physical Anesthesia Plan  ASA: III  Anesthesia Plan: MAC   Post-op Pain Management:    Induction:   Airway Management Planned:   Additional Equipment:   Intra-op Plan:   Post-operative Plan:   Informed Consent: I have reviewed the patients History and Physical, chart, labs and discussed the procedure including the risks, benefits and alternatives for the proposed anesthesia with the patient or authorized representative who has indicated his/her understanding and acceptance.     Plan Discussed with: Anesthesiologist,  CRNA and Surgeon  Anesthesia Plan Comments:         Anesthesia Quick Evaluation  

## 2017-02-11 NOTE — Op Note (Signed)
PREOPERATIVE DIAGNOSIS:  Nuclear sclerotic cataract of the right eye.   POSTOPERATIVE DIAGNOSIS:  nuclear sclerotic cataract right eye   OPERATIVE PROCEDURE: Procedure(s): CATARACT EXTRACTION PHACO AND INTRAOCULAR LENS PLACEMENT (IOC)   SURGEON:  Birder Robson, MD.   ANESTHESIA:  Anesthesiologist: Andria Frames, MD CRNA: Darlyne Russian, CRNA  1.      Managed anesthesia care. 2.      0.60ml of Shugarcaine was instilled in the eye following the paracentesis.   COMPLICATIONS:  None.   TECHNIQUE:   Stop and chop   DESCRIPTION OF PROCEDURE:  The patient was examined and consented in the preoperative holding area where the aforementioned topical anesthesia was applied to the right eye and then brought back to the Operating Room where the right eye was prepped and draped in the usual sterile ophthalmic fashion and a lid speculum was placed. A paracentesis was created with the side port blade and the anterior chamber was filled with viscoelastic. A near clear corneal incision was performed with the steel keratome. A continuous curvilinear capsulorrhexis was performed with a cystotome followed by the capsulorrhexis forceps. Hydrodissection and hydrodelineation were carried out with BSS on a blunt cannula. The lens was removed in a stop and chop  technique and the remaining cortical material was removed with the irrigation-aspiration handpiece. The capsular bag was inflated with viscoelastic and the Technis [id number]  lens was placed in the capsular bag without complication. The remaining viscoelastic was removed from the eye with the irrigation-aspiration handpiece. The wounds were hydrated. The anterior chamber was flushed with Miostat and the eye was inflated to physiologic pressure. 0.10ml of Vigamox was placed in the anterior chamber. The wounds were found to be water tight. The eye was dressed with Vigamox. The patient was given protective glasses to wear throughout the day and a shield with which  to sleep tonight. The patient was also given drops with which to begin a drop regimen today and will follow-up with me in one day.  Implant Name Type Inv. Item Serial No. Manufacturer Lot No. LRB No. Used  LENS IOL DIOP 20.0 - [id number] Intraocular Lens LENS IOL DIOP 20.0 [id number] AMO   Right 1   Procedure(s) with comments: CATARACT EXTRACTION PHACO AND INTRAOCULAR LENS PLACEMENT (IOC) (Right) - [place] 00:42 AP% 22.4 CDE 9.47 Fluid pack lot # [id number]  Electronically signed: Carey 02/11/2017 10:08 AM

## 2017-02-11 NOTE — Anesthesia Post-op Follow-up Note (Cosign Needed)
Anesthesia QCDR form completed.        

## 2017-02-11 NOTE — Anesthesia Postprocedure Evaluation (Signed)
Anesthesia Post Note  Patient: Earl Hays  Procedure(s) Performed: Procedure(s) (LRB): CATARACT EXTRACTION PHACO AND INTRAOCULAR LENS PLACEMENT (IOC) (Right)  Patient location during evaluation: PACU Anesthesia Type: MAC Level of consciousness: awake and alert Pain management: pain level controlled Vital Signs Assessment: post-procedure vital signs reviewed and stable Respiratory status: spontaneous breathing and nonlabored ventilation Cardiovascular status: blood pressure returned to baseline and stable Postop Assessment: no headache, no backache and no signs of nausea or vomiting Anesthetic complications: no     Last Vitals:  Vitals:   02/11/17 0848 02/11/17 1012  BP: (!) 171/93 (!) 160/98  Pulse: (!) 119 76  Resp: 18 16  Temp: 36.6 C     Last Pain:  Vitals:   02/11/17 1012  TempSrc: Oral                 Darlyne Russian

## 2017-02-11 NOTE — H&P (Signed)
All labs reviewed. Abnormal studies sent to patients PCP when indicated.  Previous H&P reviewed, patient examined, there are NO CHANGES.  Davari Lopes LOUIS2/20/20189:43 AM

## 2017-02-11 NOTE — Discharge Instructions (Signed)
FOLLOW DR. PORFILIO'S POSTOP DISCHARGE INSTRUCTION SHEET AS REVIEWED.  Eye Surgery Discharge Instructions  Expect mild scratchy sensation or mild soreness. DO NOT RUB YOUR EYE!  The day of surgery:  Minimal physical activity, but bed rest is not required  No reading, computer work, or close hand work  No bending, lifting, or straining.  May watch TV  For 24 hours:  No driving, legal decisions, or alcoholic beverages  Safety precautions  Eat anything you prefer: It is better to start with liquids, then soup then solid foods.  _____ Eye patch should be worn until postoperative exam tomorrow.  ____ Solar shield eyeglasses should be worn for comfort in the sunlight/patch while sleeping  Resume all regular medications including aspirin or Coumadin if these were discontinued prior to surgery. You may shower, bathe, shave, or wash your hair. Tylenol may be taken for mild discomfort.  Call your doctor if you experience significant pain, nausea, or vomiting, fever > 101 or other signs of infection. [phone] or [phone] Specific instructions:  Follow-up Information    PORFILIO,WILLIAM LOUIS, MD Follow up.   Specialty:  Ophthalmology Why:  wed 02/12/17 @ 10:40 am Contact information: [address] [place] [address] [phone]

## 2017-02-11 NOTE — Transfer of Care (Signed)
Immediate Anesthesia Transfer of Care Note  Patient: Earl Hays  Procedure(s) Performed: Procedure(s) with comments: CATARACT EXTRACTION PHACO AND INTRAOCULAR LENS PLACEMENT (IOC) (Right) - [place] 00:42 AP% 22.4 CDE 9.47 Fluid pack lot # [id number]  Patient Location: PACU  Anesthesia Type:MAC  Level of Consciousness: awake, alert  and oriented  Airway & Oxygen Therapy: Patient Spontanous Breathing  Post-op Assessment: Report given to RN and Post -op Vital signs reviewed and stable  Post vital signs: Reviewed and stable  Last Vitals:  Vitals:   02/11/17 0848 02/11/17 1012  BP: (!) 171/93 (!) 160/98  Pulse: (!) 119 76  Resp: 18 16  Temp: 36.6 C     Last Pain:  Vitals:   02/11/17 1012  TempSrc: Oral         Complications: No apparent anesthesia complications

## 2017-09-23 ENCOUNTER — Ambulatory Visit: Admission: RE | Admit: 2017-09-23 | Discharge: 2017-09-23 | Payer: MEDICARE

## 2017-09-23 DIAGNOSIS — R21 Rash and other nonspecific skin eruption: Principal | ICD-10-CM

## 2017-09-23 DIAGNOSIS — L309 Dermatitis, unspecified: Secondary | ICD-10-CM

## 2017-09-23 MED ORDER — BETAMETHASONE DIPROPIONATE 0.05 % TOPICAL OINTMENT
6 refills | 0 days | Status: CP
Start: 2017-09-23 — End: 2018-04-15

## 2017-09-23 MED ORDER — PREDNISONE 10 MG TABLET
ORAL_TABLET | 0 refills | 0 days | Status: CP
Start: 2017-09-23 — End: 2017-12-10

## 2017-09-24 MED ORDER — TRIAMCINOLONE ACETONIDE 0.1 % TOPICAL OINTMENT
6 refills | 0 days | Status: CP
Start: 2017-09-24 — End: 2017-10-29

## 2017-09-29 ENCOUNTER — Ambulatory Visit
Admission: RE | Admit: 2017-09-29 | Discharge: 2017-09-29 | Payer: MEDICARE | Attending: Dermatology | Admitting: Dermatology

## 2017-10-01 ENCOUNTER — Ambulatory Visit: Admission: RE | Admit: 2017-10-01 | Discharge: 2017-10-01 | Payer: MEDICARE

## 2017-10-01 DIAGNOSIS — L309 Dermatitis, unspecified: Principal | ICD-10-CM

## 2017-10-06 ENCOUNTER — Ambulatory Visit: Admission: RE | Admit: 2017-10-06 | Discharge: 2017-10-06 | Payer: MEDICARE | Attending: Dermatology

## 2017-10-06 DIAGNOSIS — L309 Dermatitis, unspecified: Principal | ICD-10-CM

## 2017-10-08 ENCOUNTER — Ambulatory Visit: Admission: RE | Admit: 2017-10-08 | Discharge: 2017-10-08 | Payer: MEDICARE

## 2017-10-08 DIAGNOSIS — L309 Dermatitis, unspecified: Principal | ICD-10-CM

## 2017-10-13 ENCOUNTER — Ambulatory Visit
Admission: RE | Admit: 2017-10-13 | Discharge: 2017-10-13 | Payer: MEDICARE | Attending: Dermatology | Admitting: Dermatology

## 2017-10-13 DIAGNOSIS — L309 Dermatitis, unspecified: Principal | ICD-10-CM

## 2017-10-15 ENCOUNTER — Ambulatory Visit: Admission: RE | Admit: 2017-10-15 | Discharge: 2017-10-15 | Payer: MEDICARE

## 2017-10-15 DIAGNOSIS — L309 Dermatitis, unspecified: Principal | ICD-10-CM

## 2017-10-20 ENCOUNTER — Ambulatory Visit
Admission: RE | Admit: 2017-10-20 | Discharge: 2017-10-20 | Payer: MEDICARE | Attending: Dermatology | Admitting: Dermatology

## 2017-10-20 DIAGNOSIS — L309 Dermatitis, unspecified: Principal | ICD-10-CM

## 2017-10-22 ENCOUNTER — Ambulatory Visit: Admission: RE | Admit: 2017-10-22 | Discharge: 2017-10-22 | Payer: MEDICARE

## 2017-10-22 DIAGNOSIS — L309 Dermatitis, unspecified: Principal | ICD-10-CM

## 2017-10-27 ENCOUNTER — Ambulatory Visit: Admission: RE | Admit: 2017-10-27 | Discharge: 2017-10-27 | Payer: MEDICARE | Attending: Dermatology

## 2017-10-27 DIAGNOSIS — L309 Dermatitis, unspecified: Principal | ICD-10-CM

## 2017-10-29 ENCOUNTER — Ambulatory Visit: Admission: RE | Admit: 2017-10-29 | Discharge: 2017-10-29 | Payer: MEDICARE

## 2017-10-29 DIAGNOSIS — L2089 Other atopic dermatitis: Secondary | ICD-10-CM

## 2017-10-29 DIAGNOSIS — L309 Dermatitis, unspecified: Principal | ICD-10-CM

## 2017-10-29 DIAGNOSIS — Z79899 Other long term (current) drug therapy: Secondary | ICD-10-CM

## 2017-10-29 MED ORDER — FOLIC ACID 1 MG TABLET
ORAL_TABLET | 3 refills | 0 days | Status: CP
Start: 2017-10-29 — End: 2017-12-10

## 2017-10-29 MED ORDER — TRIAMCINOLONE ACETONIDE 0.1 % TOPICAL OINTMENT
6 refills | 0 days | Status: CP
Start: 2017-10-29 — End: 2017-12-10

## 2017-10-29 MED ORDER — METHOTREXATE SODIUM 2.5 MG TABLET
ORAL_TABLET | ORAL | 0 refills | 0 days | Status: CP
Start: 2017-10-29 — End: 2017-11-28

## 2017-11-03 ENCOUNTER — Ambulatory Visit
Admission: RE | Admit: 2017-11-03 | Discharge: 2017-11-03 | Payer: MEDICARE | Attending: Dermatology | Admitting: Dermatology

## 2017-11-03 DIAGNOSIS — L309 Dermatitis, unspecified: Principal | ICD-10-CM

## 2017-11-05 ENCOUNTER — Ambulatory Visit: Admission: RE | Admit: 2017-11-05 | Discharge: 2017-11-05 | Payer: MEDICARE

## 2017-11-05 DIAGNOSIS — L309 Dermatitis, unspecified: Principal | ICD-10-CM

## 2017-11-10 ENCOUNTER — Ambulatory Visit
Admission: RE | Admit: 2017-11-10 | Discharge: 2017-11-10 | Payer: MEDICARE | Attending: Dermatology | Admitting: Dermatology

## 2017-11-10 DIAGNOSIS — L309 Dermatitis, unspecified: Principal | ICD-10-CM

## 2017-11-12 ENCOUNTER — Ambulatory Visit: Admission: RE | Admit: 2017-11-12 | Discharge: 2017-11-12 | Payer: MEDICARE

## 2017-11-12 DIAGNOSIS — L309 Dermatitis, unspecified: Principal | ICD-10-CM

## 2017-11-17 ENCOUNTER — Ambulatory Visit
Admission: RE | Admit: 2017-11-17 | Discharge: 2017-11-17 | Payer: MEDICARE | Attending: Dermatology | Admitting: Dermatology

## 2017-11-17 DIAGNOSIS — L309 Dermatitis, unspecified: Principal | ICD-10-CM

## 2017-11-19 ENCOUNTER — Ambulatory Visit: Admission: RE | Admit: 2017-11-19 | Discharge: 2017-11-19 | Payer: MEDICARE

## 2017-11-19 DIAGNOSIS — L309 Dermatitis, unspecified: Principal | ICD-10-CM

## 2017-11-24 ENCOUNTER — Ambulatory Visit: Admission: RE | Admit: 2017-11-24 | Discharge: 2017-11-24 | Payer: MEDICARE | Admitting: Dermatology

## 2017-11-24 DIAGNOSIS — L309 Dermatitis, unspecified: Principal | ICD-10-CM

## 2017-11-26 ENCOUNTER — Ambulatory Visit: Admission: RE | Admit: 2017-11-26 | Discharge: 2017-11-26 | Payer: MEDICARE

## 2017-11-26 DIAGNOSIS — L309 Dermatitis, unspecified: Principal | ICD-10-CM

## 2017-12-03 ENCOUNTER — Ambulatory Visit: Admission: RE | Admit: 2017-12-03 | Discharge: 2017-12-03 | Payer: MEDICARE

## 2017-12-03 DIAGNOSIS — L309 Dermatitis, unspecified: Principal | ICD-10-CM

## 2017-12-08 ENCOUNTER — Ambulatory Visit
Admission: RE | Admit: 2017-12-08 | Discharge: 2017-12-08 | Payer: MEDICARE | Attending: Dermatology | Admitting: Dermatology

## 2017-12-08 DIAGNOSIS — L309 Dermatitis, unspecified: Principal | ICD-10-CM

## 2017-12-10 ENCOUNTER — Ambulatory Visit: Admission: RE | Admit: 2017-12-10 | Discharge: 2017-12-10 | Payer: MEDICARE

## 2017-12-10 DIAGNOSIS — Z79899 Other long term (current) drug therapy: Secondary | ICD-10-CM

## 2017-12-10 DIAGNOSIS — L309 Dermatitis, unspecified: Principal | ICD-10-CM

## 2017-12-10 MED ORDER — METHOTREXATE SODIUM 2.5 MG TABLET: 15 mg | tablet | 1 refills | 0 days | Status: AC

## 2017-12-10 MED ORDER — FOLIC ACID 1 MG TABLET
ORAL_TABLET | 3 refills | 0 days | Status: CP
Start: 2017-12-10 — End: 2018-05-05

## 2017-12-10 MED ORDER — TRIAMCINOLONE ACETONIDE 0.1 % TOPICAL OINTMENT
6 refills | 0 days | Status: CP
Start: 2017-12-10 — End: 2018-02-10

## 2017-12-10 MED ORDER — METHOTREXATE SODIUM 2.5 MG TABLET
ORAL_TABLET | ORAL | 1 refills | 0.00000 days | Status: CP
Start: 2017-12-10 — End: 2018-01-24

## 2017-12-23 DIAGNOSIS — I2510 Atherosclerotic heart disease of native coronary artery without angina pectoris: Secondary | ICD-10-CM

## 2017-12-23 HISTORY — DX: Atherosclerotic heart disease of native coronary artery without angina pectoris: I25.10

## 2017-12-24 ENCOUNTER — Encounter: Admit: 2017-12-24 | Discharge: 2017-12-25 | Payer: BLUE CROSS/BLUE SHIELD

## 2017-12-24 DIAGNOSIS — L309 Dermatitis, unspecified: Principal | ICD-10-CM

## 2017-12-29 ENCOUNTER — Encounter: Admit: 2017-12-29 | Discharge: 2017-12-30 | Payer: BLUE CROSS/BLUE SHIELD

## 2017-12-29 ENCOUNTER — Observation Stay
Admission: EM | Admit: 2017-12-29 | Discharge: 2017-12-31 | Disposition: A | Discharge status: Home / Self Care | Payer: Medicare PPO | Attending: Internal Medicine | Admitting: Internal Medicine

## 2017-12-29 ENCOUNTER — Emergency Department: Payer: Medicare PPO

## 2017-12-29 ENCOUNTER — Other Ambulatory Visit: Payer: Self-pay

## 2017-12-29 DIAGNOSIS — L309 Dermatitis, unspecified: Principal | ICD-10-CM

## 2017-12-29 DIAGNOSIS — I2510 Atherosclerotic heart disease of native coronary artery without angina pectoris: Secondary | ICD-10-CM | POA: Insufficient documentation

## 2017-12-29 DIAGNOSIS — E876 Hypokalemia: Secondary | ICD-10-CM | POA: Insufficient documentation

## 2017-12-29 DIAGNOSIS — R899 Unspecified abnormal finding in specimens from other organs, systems and tissues: Secondary | ICD-10-CM | POA: Diagnosis not present

## 2017-12-29 DIAGNOSIS — R0789 Other chest pain: Principal | ICD-10-CM | POA: Insufficient documentation

## 2017-12-29 DIAGNOSIS — Z96653 Presence of artificial knee joint, bilateral: Secondary | ICD-10-CM | POA: Diagnosis not present

## 2017-12-29 DIAGNOSIS — D696 Thrombocytopenia, unspecified: Secondary | ICD-10-CM | POA: Insufficient documentation

## 2017-12-29 DIAGNOSIS — I517 Cardiomegaly: Secondary | ICD-10-CM | POA: Insufficient documentation

## 2017-12-29 DIAGNOSIS — K219 Gastro-esophageal reflux disease without esophagitis: Secondary | ICD-10-CM | POA: Diagnosis not present

## 2017-12-29 DIAGNOSIS — R05 Cough: Secondary | ICD-10-CM | POA: Diagnosis present

## 2017-12-29 DIAGNOSIS — R079 Chest pain, unspecified: Secondary | ICD-10-CM | POA: Diagnosis present

## 2017-12-29 DIAGNOSIS — R051 Acute cough: Secondary | ICD-10-CM | POA: Diagnosis present

## 2017-12-29 DIAGNOSIS — I428 Other cardiomyopathies: Secondary | ICD-10-CM | POA: Insufficient documentation

## 2017-12-29 DIAGNOSIS — I5041 Acute combined systolic (congestive) and diastolic (congestive) heart failure: Secondary | ICD-10-CM | POA: Insufficient documentation

## 2017-12-29 DIAGNOSIS — Z7982 Long term (current) use of aspirin: Secondary | ICD-10-CM | POA: Diagnosis not present

## 2017-12-29 DIAGNOSIS — D500 Iron deficiency anemia secondary to blood loss (chronic): Secondary | ICD-10-CM | POA: Diagnosis not present

## 2017-12-29 DIAGNOSIS — Z79899 Other long term (current) drug therapy: Secondary | ICD-10-CM | POA: Insufficient documentation

## 2017-12-29 DIAGNOSIS — Z888 Allergy status to other drugs, medicaments and biological substances status: Secondary | ICD-10-CM | POA: Diagnosis not present

## 2017-12-29 DIAGNOSIS — R059 Cough, unspecified: Secondary | ICD-10-CM

## 2017-12-29 DIAGNOSIS — R140 Abdominal distension (gaseous): Secondary | ICD-10-CM | POA: Diagnosis present

## 2017-12-29 DIAGNOSIS — Z87891 Personal history of nicotine dependence: Secondary | ICD-10-CM | POA: Insufficient documentation

## 2017-12-29 DIAGNOSIS — I214 Non-ST elevation (NSTEMI) myocardial infarction: Secondary | ICD-10-CM

## 2017-12-29 DIAGNOSIS — R0602 Shortness of breath: Secondary | ICD-10-CM | POA: Diagnosis present

## 2017-12-29 DIAGNOSIS — R053 Chronic cough: Secondary | ICD-10-CM | POA: Diagnosis present

## 2017-12-29 LAB — BASIC METABOLIC PANEL
Anion gap: 8 (ref 5–15)
BUN: 16 mg/dL (ref 6–20)
CO2: 25 mmol/L (ref 22–32)
Calcium: 8.7 mg/dL — ABNORMAL LOW (ref 8.9–10.3)
Chloride: 104 mmol/L (ref 101–111)
Creatinine, Ser: 1.13 mg/dL (ref 0.61–1.24)
GFR calc Af Amer: >60 mL/min (ref >60)
GFR calc non Af Amer: >60 mL/min (ref >60)
Glucose, Bld: 97 mg/dL (ref 65–99)
Potassium: 3.4 mmol/L — ABNORMAL LOW (ref 3.5–5.1)
Sodium: 137 mmol/L (ref 135–145)

## 2017-12-29 LAB — CBC
HCT: 40 % (ref 40.0–52.0)
Hemoglobin: 13.5 g/dL (ref 13.0–18.0)
MCH: 30.6 pg (ref 26.0–34.0)
MCHC: 33.8 g/dL (ref 32.0–36.0)
MCV: 90.7 fL (ref 80.0–100.0)
Platelets: 148 10*3/uL — ABNORMAL LOW (ref 150–440)
RBC: 4.4 MIL/uL (ref 4.40–5.90)
RDW: 14.2 % (ref 11.5–14.5)
WBC: 5.6 10*3/uL (ref 3.8–10.6)

## 2017-12-29 LAB — TROPONIN I: Troponin I: 0.05 ng/mL (ref <0.03)

## 2017-12-29 MED ORDER — ASPIRIN 81 MG PO CHEW
324.0000 mg | CHEWABLE_TABLET | Freq: Once | ORAL | Status: AC
  Administered 2017-12-29: 324 mg via ORAL
  Filled 2017-12-29: qty 4

## 2017-12-29 MED ORDER — BENZONATATE 100 MG PO CAPS
200.0000 mg | ORAL_CAPSULE | Freq: Once | ORAL | Status: AC
  Administered 2017-12-29: 200 mg via ORAL
  Filled 2017-12-29: qty 2

## 2017-12-29 NOTE — ED Notes (Signed)
Pt back from x-ray.

## 2017-12-29 NOTE — ED Provider Notes (Addendum)
[facility] Emergency Department Provider Note  ____________________________________________  Time seen: Approximately 9:38 PM  I have reviewed the triage vital signs and the nursing notes.   HISTORY  Chief Complaint Cough    HPI Earl Hays is a 77 y.o. male w/ a hx of GERD presenting w/ cough and shortness of breath.  Pt reports he has had 6 days of nonproductive cough without fever, chills, nausea or vomiting, congestion or rhinorrhea, sore throat or ear pain.  He has completed a Z-Pak without any improvement of his symptoms.  Tonight, the patient felt short of breath both at rest and with exertion, and had some mild left-sided chest discomfort, now resolved.  Past Medical History:  Diagnosis Date  . GERD (gastroesophageal reflux disease)   . Varices, esophageal (HCC)     There are no active problems to display for this patient.   Past Surgical History:  Procedure Laterality Date  . CATARACT EXTRACTION W/PHACO Left 01/21/2017   Procedure: CATARACT EXTRACTION PHACO AND INTRAOCULAR LENS PLACEMENT (IOC);  Surgeon: Birder Robson, MD;  Location: [facility] ORS;  Service: Ophthalmology;  Laterality: Left;  [place] 00:48 AP% 21.6 CDE 10.39 Fluid pack lot # [id number]  . CATARACT EXTRACTION W/PHACO Right 02/11/2017   Procedure: CATARACT EXTRACTION PHACO AND INTRAOCULAR LENS PLACEMENT (IOC);  Surgeon: Birder Robson, MD;  Location: [facility] ORS;  Service: Ophthalmology;  Laterality: Right;  [place] 00:42 AP% 22.4 CDE 9.47 Fluid pack lot # [id number] H  . COLONOSCOPY    . JOINT REPLACEMENT     TKR  . REPLACEMENT TOTAL KNEE BILATERAL Bilateral     Current Outpatient Rx  . Order #: [id number] Class: Historical Med  . Order #: [id number] Class: Historical Med  . Order #: [id number] Class: Historical Med    Allergies Lamisil [terbinafine]  Family History  Problem Relation Age of Onset  . Brain cancer Mother     Social History Social History   Tobacco Use  . Smoking  status: Former Research scientist (life sciences)  . Smokeless tobacco: Never Used  Substance Use Topics  . Alcohol use: No    Frequency: Never    Comment: 3-4 weekly  . Drug use: No    Review of Systems Constitutional: No fever/chills. No lightheadedness or syncope. Eyes: No visual changes.  No eye discharge. ENT: No sore throat. No congestion or rhinorrhea.  No ear pain. Cardiovascular: + chest pain. Denies palpitations. Respiratory: + shortness of breath.  + cough. Gastrointestinal: No abdominal pain.  No nausea, no vomiting.  No diarrhea.  No constipation. Genitourinary: Negative for dysuria. Musculoskeletal: Negative for back pain.  No lower extremity swelling or calf pain. Skin: Negative for rash. Neurological: Negative for headaches. No focal numbness, tingling or weakness.     ____________________________________________   PHYSICAL EXAM:  VITAL SIGNS: ED Triage Vitals  Enc Vitals Group     BP 12/29/17 2109 (!) 154/97     Pulse Rate 12/29/17 2109 93     Resp 12/29/17 2109 (!) 22     Temp 12/29/17 2109 98.5 F (36.9 C)     Temp Source 12/29/17 2109 Oral     SpO2 12/29/17 2109 95 %     Weight 12/29/17 2110 180 lb (81.6 kg)     Height 12/29/17 2110 5\' 10"  (1.778 m)     Head Circumference --      Peak Flow --      Pain Score 12/29/17 2104 5     Pain Loc --      Pain Edu? --  Excl. in [place]? --     Constitutional: Alert and oriented. Well appearing and in no acute distress. Answers questions appropriately.  In comfortably in the stretcher and joking around with the staff Eyes: Conjunctivae are normal.  EOMI. No scleral icterus.  No eye discharge. Head: Atraumatic. Nose: No congestion/rhinnorhea. Mouth/Throat: Mucous membranes are moist.  Neck: No stridor.  Supple.  No JVD.  No meningismus. Cardiovascular: Normal rate, regular rhythm. No murmurs, rubs or gallops.  Respiratory: Normal respiratory effort.  No accessory muscle use or retractions. Lungs CTAB.  No wheezes, rales or  ronchi. Gastrointestinal: Soft, nontender and nondistended.  No guarding or rebound.  No peritoneal signs. Musculoskeletal: No LE edema. No ttp in the calves or palpable cords.  Negative Homan's sign. Neurologic:  A&Ox3.  Speech is clear.  Face and smile are symmetric.  EOMI.  Moves all extremities well. Skin:  Skin is warm, dry and intact. No rash noted. Psychiatric: Mood and affect are normal. Speech and behavior are normal.  Normal judgement.  ____________________________________________   LABS (all labs ordered are listed, but only abnormal results are displayed)  Labs Reviewed  CBC - Abnormal; Notable for the following components:      Result Value   Platelets 148 (*)    All other components within normal limits  BASIC METABOLIC PANEL - Abnormal; Notable for the following components:   Potassium 3.4 (*)    Calcium 8.7 (*)    All other components within normal limits  TROPONIN I - Abnormal; Notable for the following components:   Troponin I 0.05 (*)    All other components within normal limits   ____________________________________________  EKG  ED ECG REPORT I, Eula Listen, the attending physician, personally viewed and interpreted this ECG.   Date: 12/29/2017  EKG Time: 2108  Rate: 95  Rhythm: normal sinus rhythm; atrial premature complex  Axis: normal  Intervals:none  ST&T Change: No STEMI  ____________________________________________  RADIOLOGY  Dg Chest 2 View  Result Date: 12/29/2017 CLINICAL DATA:  Cough and chest pain EXAM: CHEST  2 VIEW COMPARISON:  01/23/2014 FINDINGS: Stable cardiomegaly. No aortic aneurysm. Diffuse increase in interstitial lung markings with peribronchial thickening compatible with acute bronchitic change is identified. No pneumonic consolidations. No pneumothorax or significant pleural effusions. Degenerative change along the dorsal spine. IMPRESSION: Cardiomegaly with acute bronchitic change. Electronically Signed   By: Ashley Royalty M.D.   On: 12/29/2017 21:33    ____________________________________________   PROCEDURES  Procedure(s) performed: None  Procedures  Critical Care performed: No ____________________________________________   INITIAL IMPRESSION / ASSESSMENT AND PLAN / ED COURSE  Pertinent labs & imaging results that were available during my care of the patient were reviewed by me and considered in my medical decision making (see chart for details).  77 y.o. male with 1 week of nonproductive cough, tonight with shortness of breath and mild left chest discomfort.  Overall, the patient is mildly hypertensive but has normal oxygenation at rest on room air.  He has no abnormal findings on his cardiopulmonary examination.  His EKG does not show any evidence of arrhythmia.  We will get basic labs, including troponin, but I anticipate that the patient's symptoms are most consistent with an upper respiratory infection, likely viral as he did not improve with a Z-Pak.  He does not have a history of COPD.  ACS or MI is much less likely.  Patient's symptoms are inconsistent with influenza.  ----------------------------------------- 9:43 PM on 12/29/2017 -----------------------------------------  The patient's chest  x-ray shows an enlarged cardiac silhouette, which was seen on prior chest x-ray in 2015.  He has bronchitic changes without any evidence of pneumonia.  I have ordered Tessalon Perles for the patient's symptoms, and will get an ambulatory pulse oximetry reading to check his oxygenation while walking.  ----------------------------------------- 9:44 PM on 12/29/2017 -----------------------------------------  The patient was able to maintain oxygen saturations of greater than 92% with emulation.  ----------------------------------------- 10:42 PM on 12/29/2017 -----------------------------------------  The patient does have an elevated troponin at 0.05, which will need to be trended.  It is  possible that his chest pain was in fact due to an end STEMI, and an aspirin has been ordered.  He is not having any active chest pain at this time, so heparinization is not indicated.  I will plan to admit the patient to the hospitalist at this time.  ----------------------------------------- 10:46 PM on 12/29/2017 -----------------------------------------  I spoken to the patient about his results, and he reiterates that he is not having any pain in the chest at this time.  He does state that his abdomen has become distended since lunch today, and he has had 3 bowel movements of normal stool which is unusual for him.  An abdominal x-ray has been ordered and will be followed up by the hospitalist. ____________________________________________  FINAL CLINICAL IMPRESSION(S) / ED DIAGNOSES  Final diagnoses:  Cough  Shortness of breath  NSTEMI (non-ST elevated myocardial infarction) ([place])         NEW MEDICATIONS STARTED DURING THIS VISIT:  This SmartLink is deprecated. Use AVSMEDLIST instead to display the medication list for a patient.    Eula Listen, MD 12/29/17 [id number]    Eula Listen, MD 12/29/17 [phone]

## 2017-12-29 NOTE — ED Triage Notes (Signed)
Pt to the er for cough x 1 week. Already tx with z pack. Increased pain and cough

## 2017-12-30 ENCOUNTER — Other Ambulatory Visit: Payer: Self-pay

## 2017-12-30 ENCOUNTER — Inpatient Hospital Stay (HOSPITAL_COMMUNITY)
Admit: 2017-12-30 | Discharge: 2017-12-30 | Disposition: A | Discharge status: Home / Self Care | Payer: Medicare PPO | Attending: Physician Assistant | Admitting: Physician Assistant

## 2017-12-30 DIAGNOSIS — R051 Acute cough: Secondary | ICD-10-CM | POA: Diagnosis present

## 2017-12-30 DIAGNOSIS — R079 Chest pain, unspecified: Secondary | ICD-10-CM | POA: Diagnosis present

## 2017-12-30 DIAGNOSIS — I340 Nonrheumatic mitral (valve) insufficiency: Secondary | ICD-10-CM

## 2017-12-30 DIAGNOSIS — R05 Cough: Secondary | ICD-10-CM | POA: Diagnosis not present

## 2017-12-30 DIAGNOSIS — I361 Nonrheumatic tricuspid (valve) insufficiency: Secondary | ICD-10-CM | POA: Diagnosis not present

## 2017-12-30 DIAGNOSIS — I5041 Acute combined systolic (congestive) and diastolic (congestive) heart failure: Secondary | ICD-10-CM | POA: Diagnosis not present

## 2017-12-30 DIAGNOSIS — I429 Cardiomyopathy, unspecified: Secondary | ICD-10-CM | POA: Diagnosis not present

## 2017-12-30 DIAGNOSIS — I248 Other forms of acute ischemic heart disease: Secondary | ICD-10-CM | POA: Diagnosis not present

## 2017-12-30 DIAGNOSIS — R053 Chronic cough: Secondary | ICD-10-CM | POA: Diagnosis present

## 2017-12-30 LAB — CBC
HCT: 38.2 % — ABNORMAL LOW (ref 40.0–52.0)
Hemoglobin: 12.9 g/dL — ABNORMAL LOW (ref 13.0–18.0)
MCH: 31 pg (ref 26.0–34.0)
MCHC: 33.9 g/dL (ref 32.0–36.0)
MCV: 91.4 fL (ref 80.0–100.0)
Platelets: 129 10*3/uL — ABNORMAL LOW (ref 150–440)
RBC: 4.18 MIL/uL — ABNORMAL LOW (ref 4.40–5.90)
RDW: 13.9 % (ref 11.5–14.5)
WBC: 5.4 10*3/uL (ref 3.8–10.6)

## 2017-12-30 LAB — BASIC METABOLIC PANEL
Anion gap: 7 (ref 5–15)
BUN: 15 mg/dL (ref 6–20)
CO2: 28 mmol/L (ref 22–32)
Calcium: 8.6 mg/dL — ABNORMAL LOW (ref 8.9–10.3)
Chloride: 106 mmol/L (ref 101–111)
Creatinine, Ser: 1.21 mg/dL (ref 0.61–1.24)
GFR calc Af Amer: >60 mL/min (ref >60)
GFR calc non Af Amer: 57 mL/min — ABNORMAL LOW (ref >60)
Glucose, Bld: 99 mg/dL (ref 65–99)
Potassium: 3.3 mmol/L — ABNORMAL LOW (ref 3.5–5.1)
Sodium: 141 mmol/L (ref 135–145)

## 2017-12-30 LAB — ECHOCARDIOGRAM COMPLETE
Height: 70 in
Weight: 2947.2 oz

## 2017-12-30 LAB — AMYLASE: Amylase: 102 U/L — ABNORMAL HIGH (ref 28–100)

## 2017-12-30 LAB — LIPASE, BLOOD: Lipase: 67 U/L — ABNORMAL HIGH (ref 11–51)

## 2017-12-30 LAB — GLUCOSE, CAPILLARY: Glucose-Capillary: 84 mg/dL (ref 65–99)

## 2017-12-30 LAB — TROPONIN I: Troponin I: 0.09 ng/mL (ref <0.03)

## 2017-12-30 MED ORDER — IPRATROPIUM-ALBUTEROL 0.5-2.5 (3) MG/3ML IN SOLN
3.0000 mL | Freq: Four times a day (QID) | RESPIRATORY_TRACT | Status: DC
  Administered 2017-12-30 – 2017-12-31 (×5): 3 mL via RESPIRATORY_TRACT
  Filled 2017-12-30 (×5): qty 3

## 2017-12-30 MED ORDER — ONDANSETRON HCL 4 MG/2ML IJ SOLN: 4.0000 mg | Freq: Four times a day (QID) | INTRAMUSCULAR | Status: DC | PRN

## 2017-12-30 MED ORDER — FUROSEMIDE 10 MG/ML IJ SOLN
20.0000 mg | Freq: Every day | INTRAMUSCULAR | Status: DC
  Administered 2017-12-30: 20 mg via INTRAVENOUS
  Filled 2017-12-30: qty 2

## 2017-12-30 MED ORDER — FOLIC ACID 1 MG PO TABS
1.0000 mg | ORAL_TABLET | ORAL | Status: DC
  Administered 2017-12-30: 1 mg via ORAL
  Filled 2017-12-30: qty 1

## 2017-12-30 MED ORDER — METHOTREXATE 2.5 MG PO TABS
15.0000 mg | ORAL_TABLET | ORAL | Status: DC
  Administered 2017-12-31: 15 mg via ORAL
  Filled 2017-12-30: qty 6

## 2017-12-30 MED ORDER — ASPIRIN EC 81 MG PO TBEC
81.0000 mg | DELAYED_RELEASE_TABLET | Freq: Every day | ORAL | Status: DC
  Administered 2017-12-30: 81 mg via ORAL
  Filled 2017-12-30: qty 1

## 2017-12-30 MED ORDER — POTASSIUM CHLORIDE CRYS ER 20 MEQ PO TBCR
40.0000 meq | EXTENDED_RELEASE_TABLET | Freq: Once | ORAL | Status: AC
  Administered 2017-12-30: 40 meq via ORAL
  Filled 2017-12-30: qty 2

## 2017-12-30 MED ORDER — BISACODYL 5 MG PO TBEC: 5.0000 mg | DELAYED_RELEASE_TABLET | Freq: Every day | ORAL | Status: DC | PRN

## 2017-12-30 MED ORDER — ACETAMINOPHEN 650 MG RE SUPP: 650.0000 mg | Freq: Four times a day (QID) | RECTAL | Status: DC | PRN

## 2017-12-30 MED ORDER — CARVEDILOL 3.125 MG PO TABS
3.1250 mg | ORAL_TABLET | Freq: Two times a day (BID) | ORAL | Status: DC
  Administered 2017-12-30: 3.125 mg via ORAL
  Filled 2017-12-30: qty 1

## 2017-12-30 MED ORDER — VITAMIN B-12 1000 MCG PO TABS: 10000.0000 ug | ORAL_TABLET | ORAL | Status: DC

## 2017-12-30 MED ORDER — ONDANSETRON HCL 4 MG PO TABS: 4.0000 mg | ORAL_TABLET | Freq: Four times a day (QID) | ORAL | Status: DC | PRN

## 2017-12-30 MED ORDER — VITAMIN B-12 1000 MCG PO TABS
2500.0000 ug | ORAL_TABLET | ORAL | Status: DC
  Administered 2017-12-30: 2500 ug via ORAL
  Filled 2017-12-30: qty 3

## 2017-12-30 MED ORDER — DOCUSATE SODIUM 100 MG PO CAPS
100.0000 mg | ORAL_CAPSULE | Freq: Two times a day (BID) | ORAL | Status: DC
  Administered 2017-12-30 (×2): 100 mg via ORAL
  Filled 2017-12-30 (×2): qty 1

## 2017-12-30 MED ORDER — PANTOPRAZOLE SODIUM 40 MG PO TBEC
40.0000 mg | DELAYED_RELEASE_TABLET | Freq: Every day | ORAL | Status: DC
  Administered 2017-12-30 – 2017-12-31 (×2): 40 mg via ORAL
  Filled 2017-12-30 (×2): qty 1

## 2017-12-30 MED ORDER — BENZONATATE 100 MG PO CAPS
200.0000 mg | ORAL_CAPSULE | Freq: Three times a day (TID) | ORAL | Status: DC
  Administered 2017-12-30 – 2017-12-31 (×4): 200 mg via ORAL
  Filled 2017-12-30 (×4): qty 2

## 2017-12-30 MED ORDER — HYDROCODONE-ACETAMINOPHEN 5-325 MG PO TABS: 1.0000 | ORAL_TABLET | ORAL | Status: DC | PRN

## 2017-12-30 MED ORDER — ACETAMINOPHEN 325 MG PO TABS: 650.0000 mg | ORAL_TABLET | Freq: Four times a day (QID) | ORAL | Status: DC | PRN

## 2017-12-30 MED ORDER — SODIUM CHLORIDE 0.9 % IV SOLN
INTRAVENOUS | Status: DC
  Administered 2017-12-31 (×2): via INTRAVENOUS

## 2017-12-30 MED ORDER — ASPIRIN 81 MG PO CHEW
81.0000 mg | CHEWABLE_TABLET | ORAL | Status: AC
  Administered 2017-12-31: 81 mg via ORAL
  Filled 2017-12-30: qty 1

## 2017-12-30 MED ORDER — HEPARIN SODIUM (PORCINE) 5000 UNIT/ML IJ SOLN
5000.0000 [IU] | Freq: Three times a day (TID) | INTRAMUSCULAR | Status: DC
  Administered 2017-12-30 (×3): 5000 [IU] via SUBCUTANEOUS
  Filled 2017-12-30 (×4): qty 1

## 2017-12-30 MED ORDER — METHOTREXATE 2.5 MG PO TABS: 2.5000 mg | ORAL_TABLET | Freq: Every day | ORAL | Status: DC

## 2017-12-30 MED ORDER — TRAZODONE HCL 50 MG PO TABS
25.0000 mg | ORAL_TABLET | Freq: Every evening | ORAL | Status: DC | PRN
Start: 2017-12-30 — End: 2017-12-31

## 2017-12-30 MED ORDER — IPRATROPIUM-ALBUTEROL 0.5-2.5 (3) MG/3ML IN SOLN
RESPIRATORY_TRACT | Status: AC
  Filled 2017-12-30: qty 3

## 2017-12-30 MED ORDER — CYANOCOBALAMIN 5000 MCG/ML SL LIQD: 10000.0000 ug | SUBLINGUAL | Status: DC

## 2017-12-30 NOTE — Consult Note (Signed)
Cardiology Consultation:   Patient ID: Earl Hays; [id number]; 05-04-1941   Admit date: 12/29/2017 Date of Consult: 12/30/2017  Primary Care Provider: Rusty Aus, MD Primary Cardiologist: new to [facility] - consult by End   Patient Profile:   Earl Hays is a 77 y.o. male with a hx of prior upper GI bleeding with hematemesis with esophageal varices, reflux, and eczema who is being seen today for the evaluation of atypical chest pain and elevated troponin at the request of Dr. Duane Boston.  History of Present Illness:   Earl Hays has no previously known cardiac history. He was recently started on a Z-pack by his PCP for nonproductive cough that began on 1/2. He noted did not note any cough until after recently starting methotrexate. His cough did not improve with this Z-pack. Some associated SOB. On 1/7, he developed chest discomfort associated with coughing. Pain is sharp and only noted when coughing or taking in a deep breath. Pain is not exertional. No associated diaphoresis, nausea, vomiting, palpitations, dizziness, presyncope, or syncope. Because of his chest discomfort he presented to [facility] where he was noted to have stable vitals. EKG not acute as below. CXR with acute bronchitis. Abdominal plain film with constipation. Troponin was noted to be minimally elevated at 0.05-->0.09. He was given ASA, Tessalon, Duoneb, and Protonix. Continues to note a dry cough. Cardiology was asked to evaluate his atypical chest pain.    Past Medical History:  Diagnosis Date  . GERD (gastroesophageal reflux disease)   . Varices, esophageal ([place])     Past Surgical History:  Procedure Laterality Date  . CATARACT EXTRACTION W/PHACO Left 01/21/2017   Procedure: CATARACT EXTRACTION PHACO AND INTRAOCULAR LENS PLACEMENT (IOC);  Surgeon: Birder Robson, MD;  Location: [facility] ORS;  Service: Ophthalmology;  Laterality: Left;  [place] 00:48 AP% 21.6 CDE 10.39 Fluid pack lot # [id number]  . CATARACT EXTRACTION  W/PHACO Right 02/11/2017   Procedure: CATARACT EXTRACTION PHACO AND INTRAOCULAR LENS PLACEMENT (IOC);  Surgeon: Birder Robson, MD;  Location: [facility] ORS;  Service: Ophthalmology;  Laterality: Right;  [place] 00:42 AP% 22.4 CDE 9.47 Fluid pack lot # [id number]  . COLONOSCOPY    . JOINT REPLACEMENT     TKR  . REPLACEMENT TOTAL KNEE BILATERAL Bilateral      Home Meds: Prior to Admission medications   Medication Sig Start Date End Date Taking? Authorizing Provider  aspirin EC 81 MG tablet Take 81 mg by mouth daily.   Yes [provider]  Cyanocobalamin 5000 MCG/ML LIQD Place 10,000 mcg under the tongue once a week.    Yes [provider]  folic acid (FOLVITE) 1 MG tablet Take 1 mg by mouth daily. Pt takes 1mg  everyday except on wed (methotrexate day)   Yes [provider]  methotrexate (RHEUMATREX) 2.5 MG tablet Take 4 tablets by mouth once a week. Pt takes 4 tabs on wed. Dr increased it to 6 tabs at last vist, but pt has only been taking the 4 12/10/17  Yes [provider]  pantoprazole (PROTONIX) 40 MG tablet Take 40 mg by mouth daily.   Yes [provider]    Inpatient Medications: Scheduled Meds: . aspirin EC  81 mg Oral Daily  . benzonatate  200 mg Oral TID  . docusate sodium  100 mg Oral BID  . folic acid  1 mg Oral Once per day on Sun Mon Tue Thu Fri Sat  . heparin  5,000 Units Subcutaneous Q8H  .  ipratropium-albuterol  3 mL Nebulization Q6H  . methotrexate  10 mg Oral Weekly  . pantoprazole  40 mg Oral Daily  . vitamin B-12  2,500 mcg Oral Weekly   Continuous Infusions:  PRN Meds: acetaminophen **OR** acetaminophen, bisacodyl, HYDROcodone-acetaminophen, ondansetron **OR** ondansetron (ZOFRAN) IV, traZODone  Allergies:   Allergies  Allergen Reactions  . Lamisil [Terbinafine] Itching and Rash    Social History:   Social History   Socioeconomic History  . Marital status: Single    Spouse name: Not on file  . Number of children:  Not on file  . Years of education: Not on file  . Highest education level: Not on file  Social Needs  . Financial resource strain: Not on file  . Food insecurity - worry: Not on file  . Food insecurity - inability: Not on file  . Transportation needs - medical: Not on file  . Transportation needs - non-medical: Not on file  Occupational History  . Not on file  Tobacco Use  . Smoking status: Former Research scientist (life sciences)  . Smokeless tobacco: Never Used  Substance and Sexual Activity  . Alcohol use: No    Frequency: Never    Comment: 3-4 weekly  . Drug use: No  . Sexual activity: No  Other Topics Concern  . Not on file  Social History Narrative  . Not on file     Family History:   Family History  Problem Relation Age of Onset  . Brain cancer Mother     ROS:  Review of Systems  Constitutional: Positive for malaise/fatigue. Negative for chills, diaphoresis, fever and weight loss.  HENT: Negative for congestion.   Eyes: Negative for discharge and redness.  Respiratory: Positive for cough. Negative for hemoptysis, sputum production, shortness of breath and wheezing.   Cardiovascular: Positive for chest pain. Negative for palpitations, orthopnea, claudication, leg swelling and PND.       Chest pain associated with cough  Gastrointestinal: Positive for heartburn. Negative for abdominal pain, blood in stool, melena, nausea and vomiting.  Genitourinary: Negative for hematuria.  Musculoskeletal: Negative for falls and myalgias.  Skin: Positive for itching and rash.  Neurological: Negative for dizziness, tingling, tremors, sensory change, speech change, focal weakness, loss of consciousness and weakness.  Endo/Heme/Allergies: Does not bruise/bleed easily.  Psychiatric/Behavioral: Negative for substance abuse. The patient is not nervous/anxious.   All other systems reviewed and are negative.     Physical Exam/Data:   Vitals:   12/30/17 0230 12/30/17 0312 12/30/17 0414 12/30/17 0743  BP:  123/82 127/78 138/81   Pulse: 76 75 100   Resp: 17 18 18    Temp:   (!) 97.5 F (36.4 C)   TempSrc:   Oral   SpO2: 90% 99% 98% 98%  Weight:   184 lb 3.2 oz (83.6 kg)   Height:        Intake/Output Summary (Last 24 hours) at 12/30/2017 1004 Last data filed at 12/30/2017 0422 Gross per 24 hour  Intake -  Output 150 ml  Net -150 ml   Filed Weights   12/29/17 2110 12/30/17 0414  Weight: 180 lb (81.6 kg) 184 lb 3.2 oz (83.6 kg)   Body mass index is 26.43 kg/m.   Physical Exam: General: Well developed, well nourished, in no acute distress. Head: Normocephalic, atraumatic, sclera non-icteric, no xanthomas, nares without discharge.  Neck: Negative for carotid bruits. JVD not elevated. Lungs: Clear bilaterally to auscultation without wheezes, rales, or rhonchi. Breathing is unlabored. Frequent, dry cough noted.  Heart: RRR with S1 S2. No murmurs, rubs, or gallops appreciated. Abdomen: Soft, non-tender, non-distended with normoactive bowel sounds. No hepatomegaly. No rebound/guarding. No obvious abdominal masses. Msk:  Strength and tone appear normal for age. Extremities: No clubbing or cyanosis. No edema. Distal pedal pulses are 2+ and equal bilaterally. Hyperpigmented rash noted along bilateral LE and back.  Neuro: Alert and oriented X 3. No facial asymmetry. No focal deficit. Moves all extremities spontaneously. Psych:  Responds to questions appropriately with a normal affect.   EKG:  The EKG was personally reviewed and demonstrates: NSR, 95 bpm, rare PACs, no acute st/t changes Telemetry:  Telemetry was personally reviewed and demonstrates: NSR  Weights: [employer]   12/29/17 2110 12/30/17 0414  Weight: 180 lb (81.6 kg) 184 lb 3.2 oz (83.6 kg)    Relevant CV Studies: Echo pending  Laboratory Data:  Chemistry Recent Labs  Lab 12/29/17 2138 12/30/17 0429  NA 137 141  K 3.4* 3.3*  CL 104 106  CO2 25 28  GLUCOSE 97 99  BUN 16 15  CREATININE 1.13 1.21  CALCIUM 8.7*  8.6*  GFRNONAA >60 57*  GFRAA >60 >60  ANIONGAP 8 7    No results for input(s): PROT, ALBUMIN, AST, ALT, ALKPHOS, BILITOT in the last 168 hours. Hematology Recent Labs  Lab 12/29/17 2138 12/30/17 0429  WBC 5.6 5.4  RBC 4.40 4.18*  HGB 13.5 12.9*  HCT 40.0 38.2*  MCV 90.7 91.4  MCH 30.6 31.0  MCHC 33.8 33.9  RDW 14.2 13.9  PLT 148* 129*   Cardiac Enzymes Recent Labs  Lab 12/29/17 2138 12/30/17 0236  TROPONINI 0.05* 0.09*   No results for input(s): TROPIPOC in the last 168 hours.  BNPNo results for input(s): BNP, PROBNP in the last 168 hours.  DDimer No results for input(s): DDIMER in the last 168 hours.  Radiology/Studies:  Dg Chest 2 View  Result Date: 12/29/2017 IMPRESSION: Cardiomegaly with acute bronchitic change. Electronically Signed   By: Ashley Royalty M.D.   On: 12/29/2017 21:33   Dg Abdomen 1 View  Result Date: 12/29/2017 IMPRESSION: Increased colonic stool burden.  No bowel obstruction.  No free air. Electronically Signed   By: Ashley Royalty M.D.   On: 12/29/2017 23:27    Assessment and Plan:   1. Elevated troponin/atypical chest pain: -This is not a NSTEMI -Consistent with supply demand ischemia in the setting of what appears to be an underlying pulmonary process -Continue to cycle troponin until peak -Chest pain is associated with coughing -Check echo, if this demonstrates preserved EF and normal WM, would not pursue further inpatient cardiac evaluation and would recommend outpatient follow up -ASA  2. Atypical chest pain/cough: -Noted after starting methotrexate  -Recommend IM further evaluate for underlying pulmonary process -Consider pulmonary evaluation, this will be deferred to IM  3. Reflux: -May be exacerbating his cough as well -Defer to IM  4. Hypokalemia: -Replete to goal > 4.0  5. Factious anemia/thrombocytopenia: -Likely dilutional  -Per IM   For questions or updates, please contact [facility] Please consult [email or web address] for  contact info under Cardiology/STEMI.   Signed, Christell Faith, PA-C [place] Pager: [phone] 12/30/2017, 10:04 AM

## 2017-12-30 NOTE — Progress Notes (Signed)
*  PRELIMINARY RESULTS* Echocardiogram 2D Echocardiogram has been performed.  Sherrie Sport 12/30/2017, 11:50 AM

## 2017-12-30 NOTE — H&P (Signed)
[facility] [place] NAME: Earl Hays    MR#:  [id number]  DATE OF BIRTH:  1941/06/09  DATE OF ADMISSION:  12/29/2017  PRIMARY CARE PHYSICIAN: Rusty Aus, MD   REQUESTING/REFERRING PHYSICIAN:   CHIEF COMPLAINT:   Chief Complaint  Patient presents with  . Cough    HISTORY OF PRESENT ILLNESS: Earl Hays  is a 77 y.o. male without significant past medical history. He came to emergency room for severe cough and retrosternal chest pain related to the coughing episodes.  Patient has had a dry cough for the past week, without any fever or chills no shortness of breath no nausea vomiting abdominal pain, no diarrhea or bleeding.  In the last 24 hours he also complained of retrosternal chest pain 8 out of 10, without any radiation, described as sharp, associated with the coughing spells.  Z-Pak that he took as outpatient did not help with the symptoms. While in the emergency room the cough and chest pain resolved with medications.  O2 saturation is 98% on room air.  Chest x-ray shows cardiomegaly with acute bronchitic changes. The patient feels better overall. However the troponin level came back slightly elevated at 0.05.  He was started on aspirin and admitted for cardiac monitoring.  PAST MEDICAL HISTORY:   Past Medical History:  Diagnosis Date  . GERD (gastroesophageal reflux disease)   . Varices, esophageal ([place])     PAST SURGICAL HISTORY:  Past Surgical History:  Procedure Laterality Date  . CATARACT EXTRACTION W/PHACO Left 01/21/2017   Procedure: CATARACT EXTRACTION PHACO AND INTRAOCULAR LENS PLACEMENT (IOC);  Surgeon: Birder Robson, MD;  Location: [facility] ORS;  Service: Ophthalmology;  Laterality: Left;  [place] 00:48 AP% 21.6 CDE 10.39 Fluid pack lot # [id number]  . CATARACT EXTRACTION W/PHACO Right 02/11/2017   Procedure: CATARACT EXTRACTION PHACO AND INTRAOCULAR LENS PLACEMENT (IOC);  Surgeon: Birder Robson, MD;  Location: [facility]  ORS;  Service: Ophthalmology;  Laterality: Right;  [place] 00:42 AP% 22.4 CDE 9.47 Fluid pack lot # [id number]  . COLONOSCOPY    . JOINT REPLACEMENT     TKR  . REPLACEMENT TOTAL KNEE BILATERAL Bilateral     SOCIAL HISTORY:  Social History   Tobacco Use  . Smoking status: Former Research scientist (life sciences)  . Smokeless tobacco: Never Used  Substance Use Topics  . Alcohol use: No    Frequency: Never    Comment: 3-4 weekly    FAMILY HISTORY:  Family History  Problem Relation Age of Onset  . Brain cancer Mother     DRUG ALLERGIES:  Allergies  Allergen Reactions  . Lamisil [Terbinafine] Itching and Rash    REVIEW OF SYSTEMS:   CONSTITUTIONAL: No fever, fatigue or weakness.  EYES: No blurred or double vision.  EARS, NOSE, AND THROAT: No tinnitus or ear pain.  RESPIRATORY: She complains of dry cough.  No shortness of breath, wheezing or hemoptysis.  CARDIOVASCULAR: Patient complains of chest pain, associated with coughing spells.  No orthopnea, edema.  GASTROINTESTINAL: No nausea, vomiting, diarrhea or abdominal pain.  GENITOURINARY: No dysuria, hematuria.  ENDOCRINE: No polyuria, nocturia,  HEMATOLOGY: No anemia, easy bruising or bleeding SKIN: No rash or lesion. MUSCULOSKELETAL: No joint pain or arthritis.   NEUROLOGIC: No tingling, numbness, no focal weakness.  PSYCHIATRY: No anxiety or depression.   MEDICATIONS AT HOME:  Prior to Admission medications   Medication Sig Start Date End Date Taking? Authorizing Provider  aspirin EC 81 MG tablet Take 81 mg  by mouth daily.   Yes [provider]  Cyanocobalamin 5000 MCG/ML LIQD Place 10,000 mcg under the tongue once a week.    Yes [provider]  methotrexate (RHEUMATREX) 2.5 MG tablet Take 4 tablets by mouth once a week. 12/10/17  Yes [provider]  methotrexate 2.5 MG tablet Take 2.5 mg by mouth daily.   Yes [provider]  pantoprazole (PROTONIX) 40 MG tablet Take 40 mg by mouth daily.   Yes [provider]      PHYSICAL EXAMINATION:   VITAL SIGNS: Blood pressure 127/78, pulse 75, temperature 98.5 F (36.9 C), temperature source Oral, resp. rate 18, height 5\' 10"  (1.778 m), weight 81.6 kg (180 lb), SpO2 99 %.  GENERAL:  77 y.o.-year-old patient lying in the bed with no acute distress.  EYES: Pupils equal, round, reactive to light and accommodation. No scleral icterus. Extraocular muscles intact.  HEENT: Head atraumatic, normocephalic. Oropharynx and nasopharynx clear.  NECK:  Supple, no jugular venous distention. No thyroid enlargement, no tenderness.  LUNGS: Normal breath sounds bilaterally, no wheezing, rales,rhonchi or crepitation. No use of accessory muscles of respiration.  CARDIOVASCULAR: S1, S2 normal. No murmurs, rubs, or gallops.  ABDOMEN: Soft, nontender, nondistended. Bowel sounds present. No organomegaly or mass.  EXTREMITIES: No pedal edema, cyanosis, or clubbing.  NEUROLOGIC: Cranial nerves II through XII are intact. Muscle strength 5/5 in all extremities. Sensation intact. Gait not checked.  PSYCHIATRIC: The patient is alert and oriented x 3.  SKIN: No obvious rash, lesion, or ulcer.   LABORATORY PANEL:   CBC Recent Labs  Lab 12/29/17 2138  WBC 5.6  HGB 13.5  HCT 40.0  PLT 148*  MCV 90.7  MCH 30.6  MCHC 33.8  RDW 14.2   ------------------------------------------------------------------------------------------------------------------  Chemistries  Recent Labs  Lab 12/29/17 2138  NA 137  K 3.4*  CL 104  CO2 25  GLUCOSE 97  BUN 16  CREATININE 1.13  CALCIUM 8.7*   ------------------------------------------------------------------------------------------------------------------ estimated creatinine clearance is 59.2 mL/min (by C-G formula based on SCr of 1.13 mg/dL). ------------------------------------------------------------------------------------------------------------------ No results for input(s): TSH, T4TOTAL, T3FREE, THYROIDAB in  the last 72 hours.  Invalid input(s): FREET3   Coagulation profile No results for input(s): INR, PROTIME in the last 168 hours. ------------------------------------------------------------------------------------------------------------------- No results for input(s): DDIMER in the last 72 hours. -------------------------------------------------------------------------------------------------------------------  Cardiac Enzymes Recent Labs  Lab 12/29/17 2138 12/30/17 0236  TROPONINI 0.05* 0.09*   ------------------------------------------------------------------------------------------------------------------ Invalid input(s): POCBNP  ---------------------------------------------------------------------------------------------------------------  Urinalysis No results found for: COLORURINE, APPEARANCEUR, LABSPEC, PHURINE, GLUCOSEU, HGBUR, BILIRUBINUR, KETONESUR, PROTEINUR, UROBILINOGEN, NITRITE, LEUKOCYTESUR   RADIOLOGY: Dg Chest 2 View  Result Date: 12/29/2017 CLINICAL DATA:  Cough and chest pain EXAM: CHEST  2 VIEW COMPARISON:  01/23/2014 FINDINGS: Stable cardiomegaly. No aortic aneurysm. Diffuse increase in interstitial lung markings with peribronchial thickening compatible with acute bronchitic change is identified. No pneumonic consolidations. No pneumothorax or significant pleural effusions. Degenerative change along the dorsal spine. IMPRESSION: Cardiomegaly with acute bronchitic change. Electronically Signed   By: Ashley Royalty M.D.   On: 12/29/2017 21:33   Dg Abdomen 1 View  Result Date: 12/29/2017 CLINICAL DATA:  Abdominal distention today. EXAM: ABDOMEN - 1 VIEW COMPARISON:  None. FINDINGS: Moderate fecal residue within the ascending and transverse colon. No bowel obstruction or free air. No organomegaly. Surgical tacks project over the mid pelvis. No radio-opaque calculi. Thoracolumbar spondylosis with lower lumbar facet arthropathy. IMPRESSION: Increased colonic stool burden.   No bowel obstruction.  No free air. Electronically Signed  By: Ashley Royalty M.D.   On: 12/29/2017 23:27    EKG: Orders placed or performed during the hospital encounter of 12/29/17  . ED EKG  . ED EKG    IMPRESSION AND PLAN:  1.  Acute respiratory distress secondary to acute bronchitis.  No improvement status post Z-Pak.  His bronchitis could be viral in nature we will continue to monitor clinically closely.  Will treat with duo nebs and cough medications; oxygen therapy as needed. 2.  Acute bronchitis, management as above. 3.  Non-ST elevation MI.  First troponin level was elevated at 0.05.  We will continue to monitor the troponin level.  Patient is currently free of chest pain.  Will have cardiology evaluate the patient as well.  All the records are reviewed and case discussed with ED provider. Management plans discussed with the patient, family and they are in agreement.  CODE STATUS: Code Status History    This patient does not have a recorded code status. Please follow your organizational policy for patients in this situation.    Advance Directive Documentation     Most Recent Value  Type of Advance Directive  Living will  Pre-existing out of facility DNR order (yellow form or pink MOST form)  No data  "MOST" Form in Place?  No data       TOTAL TIME TAKING CARE OF THIS PATIENT: 30 minutes.    Amelia Jo M.D on 12/30/2017 at 3:22 AM  Between 7am to 6pm - Pager - [phone]  After 6pm go to [email or web address] - password [email or web address] [facility]  Office  [phone]  CC: Primary care physician; Rusty Aus, MD

## 2017-12-30 NOTE — H&P (View-Only) (Signed)
Cardiology Consultation:   Patient ID: Earl Hays; [id number]; Feb 28, 1941   Admit date: 12/29/2017 Date of Consult: 12/30/2017  Primary Care Provider: Rusty Aus, MD Primary Cardiologist: Earl Hays - consult by End   Patient Profile:   Earl Hays is a 77 y.o. male with a hx of prior upper GI bleeding with hematemesis with esophageal varices, reflux, and eczema who is being seen today for the evaluation of atypical chest pain and elevated troponin at the request of Dr. Duane Boston.  History of Present Illness:   Earl Hays has no previously known cardiac history. He was recently started on a Z-pack by his PCP for nonproductive cough that began on 1/2. He noted did not note any cough until after recently starting methotrexate. His cough did not improve with this Z-pack. Some associated SOB. On 1/7, he developed chest discomfort associated with coughing. Pain is sharp and only noted when coughing or taking in a deep breath. Pain is not exertional. No associated diaphoresis, nausea, vomiting, palpitations, dizziness, presyncope, or syncope. Because of his chest discomfort he presented to [facility] where he was noted to have stable vitals. EKG not acute as below. CXR with acute bronchitis. Abdominal plain film with constipation. Troponin was noted to be minimally elevated at 0.05-->0.09. He was given ASA, Tessalon, Duoneb, and Protonix. Continues to note a dry cough. Cardiology was asked to evaluate his atypical chest pain.    Past Medical History:  Diagnosis Date  . GERD (gastroesophageal reflux disease)   . Varices, esophageal ([place])     Past Surgical History:  Procedure Laterality Date  . CATARACT EXTRACTION W/PHACO Left 01/21/2017   Procedure: CATARACT EXTRACTION PHACO AND INTRAOCULAR LENS PLACEMENT (IOC);  Surgeon: Hays Robson, MD;  Location: [facility] ORS;  Service: Ophthalmology;  Laterality: Left;  [place] 00:48 AP% 21.6 CDE 10.39 Fluid pack lot # [id number]  . CATARACT EXTRACTION  W/PHACO Right 02/11/2017   Procedure: CATARACT EXTRACTION PHACO AND INTRAOCULAR LENS PLACEMENT (IOC);  Surgeon: Hays Robson, MD;  Location: [facility] ORS;  Service: Ophthalmology;  Laterality: Right;  [place] 00:42 AP% 22.4 CDE 9.47 Fluid pack lot # [id number]  . COLONOSCOPY    . JOINT REPLACEMENT     TKR  . REPLACEMENT TOTAL KNEE BILATERAL Bilateral      Home Meds: Prior to Admission medications   Medication Sig Start Date End Date Taking? Authorizing Provider  aspirin EC 81 MG tablet Take 81 mg by mouth daily.   Yes [provider]  Cyanocobalamin 5000 MCG/ML LIQD Place 10,000 mcg under the tongue once a week.    Yes [provider]  folic acid (FOLVITE) 1 MG tablet Take 1 mg by mouth daily. Pt takes 1mg  everyday except on wed (methotrexate day)   Yes [provider]  methotrexate (RHEUMATREX) 2.5 MG tablet Take 4 tablets by mouth once a week. Pt takes 4 tabs on wed. Dr increased it to 6 tabs at last vist, but pt has only been taking the 4 12/10/17  Yes [provider]  pantoprazole (PROTONIX) 40 MG tablet Take 40 mg by mouth daily.   Yes [provider]    Inpatient Medications: Scheduled Meds: . aspirin EC  81 mg Oral Daily  . benzonatate  200 mg Oral TID  . docusate sodium  100 mg Oral BID  . folic acid  1 mg Oral Once per day on Sun Mon Tue Thu Fri Sat  . heparin  5,000 Units Subcutaneous Q8H  .  ipratropium-albuterol  3 mL Nebulization Q6H  . methotrexate  10 mg Oral Weekly  . pantoprazole  40 mg Oral Daily  . vitamin B-12  2,500 mcg Oral Weekly   Continuous Infusions:  PRN Meds: acetaminophen **OR** acetaminophen, bisacodyl, HYDROcodone-acetaminophen, ondansetron **OR** ondansetron (ZOFRAN) IV, traZODone  Allergies:   Allergies  Allergen Reactions  . Lamisil [Terbinafine] Itching and Rash    Social History:   Social History   Socioeconomic History  . Marital status: Single    Spouse name: Not on file  . Number of children:  Not on file  . Years of education: Not on file  . Highest education level: Not on file  Social Needs  . Financial resource strain: Not on file  . Food insecurity - worry: Not on file  . Food insecurity - inability: Not on file  . Transportation needs - medical: Not on file  . Transportation needs - non-medical: Not on file  Occupational History  . Not on file  Tobacco Use  . Smoking status: Former Research scientist (life sciences)  . Smokeless tobacco: Never Used  Substance and Sexual Activity  . Alcohol use: No    Frequency: Never    Comment: 3-4 weekly  . Drug use: No  . Sexual activity: No  Other Topics Concern  . Not on file  Social History Narrative  . Not on file     Family History:   Family History  Problem Relation Age of Onset  . Brain cancer Mother     ROS:  Review of Systems  Constitutional: Positive for malaise/fatigue. Negative for chills, diaphoresis, fever and weight loss.  HENT: Negative for congestion.   Eyes: Negative for discharge and redness.  Respiratory: Positive for cough. Negative for hemoptysis, sputum production, shortness of breath and wheezing.   Cardiovascular: Positive for chest pain. Negative for palpitations, orthopnea, claudication, leg swelling and PND.       Chest pain associated with cough  Gastrointestinal: Positive for heartburn. Negative for abdominal pain, blood in stool, melena, nausea and vomiting.  Genitourinary: Negative for hematuria.  Musculoskeletal: Negative for falls and myalgias.  Skin: Positive for itching and rash.  Neurological: Negative for dizziness, tingling, tremors, sensory change, speech change, focal weakness, loss of consciousness and weakness.  Endo/Heme/Allergies: Does not bruise/bleed easily.  Psychiatric/Behavioral: Negative for substance abuse. The patient is not nervous/anxious.   All other systems reviewed and are negative.     Physical Exam/Data:   Vitals:   12/30/17 0230 12/30/17 0312 12/30/17 0414 12/30/17 0743  BP:  123/82 127/78 138/81   Pulse: 76 75 100   Resp: 17 18 18    Temp:   (!) 97.5 F (36.4 C)   TempSrc:   Oral   SpO2: 90% 99% 98% 98%  Weight:   184 lb 3.2 oz (83.6 kg)   Height:        Intake/Output Summary (Last 24 hours) at 12/30/2017 1004 Last data filed at 12/30/2017 0422 Gross per 24 hour  Intake -  Output 150 ml  Net -150 ml   Filed Weights   12/29/17 2110 12/30/17 0414  Weight: 180 lb (81.6 kg) 184 lb 3.2 oz (83.6 kg)   Body mass index is 26.43 kg/m.   Physical Exam: General: Well developed, well nourished, in no acute distress. Head: Normocephalic, atraumatic, sclera non-icteric, no xanthomas, nares without discharge.  Neck: Negative for carotid bruits. JVD not elevated. Lungs: Clear bilaterally to auscultation without wheezes, rales, or rhonchi. Breathing is unlabored. Frequent, dry cough noted.  Heart: RRR with S1 S2. No murmurs, rubs, or gallops appreciated. Abdomen: Soft, non-tender, non-distended with normoactive bowel sounds. No hepatomegaly. No rebound/guarding. No obvious abdominal masses. Msk:  Strength and tone appear normal for age. Extremities: No clubbing or cyanosis. No edema. Distal pedal pulses are 2+ and equal bilaterally. Hyperpigmented rash noted along bilateral LE and back.  Neuro: Alert and oriented X 3. No facial asymmetry. No focal deficit. Moves all extremities spontaneously. Psych:  Responds to questions appropriately with a normal affect.   EKG:  The EKG was personally reviewed and demonstrates: NSR, 95 bpm, rare PACs, no acute st/t changes Telemetry:  Telemetry was personally reviewed and demonstrates: NSR  Weights: [employer]   12/29/17 2110 12/30/17 0414  Weight: 180 lb (81.6 kg) 184 lb 3.2 oz (83.6 kg)    Relevant CV Studies: Echo pending  Laboratory Data:  Chemistry Recent Labs  Lab 12/29/17 2138 12/30/17 0429  NA 137 141  K 3.4* 3.3*  CL 104 106  CO2 25 28  GLUCOSE 97 99  BUN 16 15  CREATININE 1.13 1.21  CALCIUM 8.7*  8.6*  GFRNONAA >60 57*  GFRAA >60 >60  ANIONGAP 8 7    No results for input(s): PROT, ALBUMIN, AST, ALT, ALKPHOS, BILITOT in the last 168 hours. Hematology Recent Labs  Lab 12/29/17 2138 12/30/17 0429  WBC 5.6 5.4  RBC 4.40 4.18*  HGB 13.5 12.9*  HCT 40.0 38.2*  MCV 90.7 91.4  MCH 30.6 31.0  MCHC 33.8 33.9  RDW 14.2 13.9  PLT 148* 129*   Cardiac Enzymes Recent Labs  Lab 12/29/17 2138 12/30/17 0236  TROPONINI 0.05* 0.09*   No results for input(s): TROPIPOC in the last 168 hours.  BNPNo results for input(s): BNP, PROBNP in the last 168 hours.  DDimer No results for input(s): DDIMER in the last 168 hours.  Radiology/Studies:  Dg Chest 2 View  Result Date: 12/29/2017 IMPRESSION: Cardiomegaly with acute bronchitic change. Electronically Signed   By: Ashley Royalty M.D.   On: 12/29/2017 21:33   Dg Abdomen 1 View  Result Date: 12/29/2017 IMPRESSION: Increased colonic stool burden.  No bowel obstruction.  No free air. Electronically Signed   By: Ashley Royalty M.D.   On: 12/29/2017 23:27    Assessment and Plan:   1. Elevated troponin/atypical chest pain: -This is not a NSTEMI -Consistent with supply demand ischemia in the setting of what appears to be an underlying pulmonary process -Continue to cycle troponin until peak -Chest pain is associated with coughing -Check echo, if this demonstrates preserved EF and normal WM, would not pursue further inpatient cardiac evaluation and would recommend outpatient follow up -ASA  2. Atypical chest pain/cough: -Noted after starting methotrexate  -Recommend IM further evaluate for underlying pulmonary process -Consider pulmonary evaluation, this will be deferred to IM  3. Reflux: -May be exacerbating his cough as well -Defer to IM  4. Hypokalemia: -Replete to goal > 4.0  5. Factious anemia/thrombocytopenia: -Likely dilutional  -Per IM   For questions or updates, please contact [facility] Please consult [email or web address] for  contact info under Cardiology/STEMI.   Signed, Christell Faith, PA-C [place] Pager: [phone] 12/30/2017, 10:04 AM

## 2017-12-30 NOTE — Progress Notes (Signed)
Dr. Manuella Ghazi paged to cancel discharge.  Patient planning to possible have cardiac catherization in the AM.  Dr. Saunders Revel to see patient.

## 2017-12-30 NOTE — Progress Notes (Signed)
Same day rounding progress note  1. Elevated troponin/atypical chest pain: -thought to be due to supply demand ischemia - abnormal echo - cardio planning cath in am -continue ASA  2. Atypical chest pain/cough: -likely viral bronchitis  3. Reflux: - continue protonix  4. Hypokalemia: -Replete and recheck  5. Factious anemia/thrombocytopenia: -Likely dilutional  -monitor  6 Elevated amylase/lipase - unsure clinical significance - Pancreatitis? He is tolerating diet ok  Time spent: 15 mins

## 2017-12-30 NOTE — Care Management Obs Status (Signed)
[facility] NOTIFICATION   Patient Details  Name: Earl Hays MRN: [id number] Date of Birth: December 24, 1940   Medicare Observation Status Notification Given:  Yes  Notice signed, one given to patient and the other to HIM for scanning   Katrina Stack, RN 12/30/2017, 3:56 PM

## 2017-12-30 NOTE — Care Management CC44 (Signed)
Condition Code 44 Documentation Completed  Patient Details  Name: Earl Hays MRN: [id number] Date of Birth: [birth year]   Condition Code 44 given:  Yes Patient signature on Condition Code 44 notice:  Yes Documentation of 2 MD's agreement:  Yes Code 44 added to claim:  Yes    Katrina Stack, RN 12/30/2017, 3:56 PM

## 2017-12-31 ENCOUNTER — Encounter
Admission: EM | Disposition: A | Discharge status: Home / Self Care | Payer: Self-pay | Source: Home / Self Care | Attending: Emergency Medicine

## 2017-12-31 ENCOUNTER — Telehealth: Payer: Self-pay | Admitting: Internal Medicine

## 2017-12-31 DIAGNOSIS — R05 Cough: Secondary | ICD-10-CM | POA: Diagnosis not present

## 2017-12-31 DIAGNOSIS — I248 Other forms of acute ischemic heart disease: Secondary | ICD-10-CM

## 2017-12-31 DIAGNOSIS — I5041 Acute combined systolic (congestive) and diastolic (congestive) heart failure: Secondary | ICD-10-CM | POA: Diagnosis not present

## 2017-12-31 HISTORY — PX: RIGHT/LEFT HEART CATH AND CORONARY ANGIOGRAPHY: CATH118266

## 2017-12-31 LAB — BASIC METABOLIC PANEL
Anion gap: 10 (ref 5–15)
BUN: 17 mg/dL (ref 6–20)
CO2: 24 mmol/L (ref 22–32)
Calcium: 9.2 mg/dL (ref 8.9–10.3)
Chloride: 103 mmol/L (ref 101–111)
Creatinine, Ser: 1.12 mg/dL (ref 0.61–1.24)
GFR calc Af Amer: >60 mL/min (ref >60)
GFR calc non Af Amer: >60 mL/min (ref >60)
Glucose, Bld: 69 mg/dL (ref 65–99)
Potassium: 3.6 mmol/L (ref 3.5–5.1)
Sodium: 137 mmol/L (ref 135–145)

## 2017-12-31 LAB — CBC
HCT: 41.3 % (ref 40.0–52.0)
Hemoglobin: 13.9 g/dL (ref 13.0–18.0)
MCH: 31 pg (ref 26.0–34.0)
MCHC: 33.7 g/dL (ref 32.0–36.0)
MCV: 91.8 fL (ref 80.0–100.0)
Platelets: 136 10*3/uL — ABNORMAL LOW (ref 150–440)
RBC: 4.49 MIL/uL (ref 4.40–5.90)
RDW: 14.6 % — ABNORMAL HIGH (ref 11.5–14.5)
WBC: 7.4 10*3/uL (ref 3.8–10.6)

## 2017-12-31 LAB — PROTIME-INR
INR: 0.93
Prothrombin Time: 12.4 seconds (ref 11.4–15.2)

## 2017-12-31 LAB — GLUCOSE, CAPILLARY: Glucose-Capillary: 70 mg/dL (ref 65–99)

## 2017-12-31 SURGERY — RIGHT/LEFT HEART CATH AND CORONARY ANGIOGRAPHY
Anesthesia: Moderate Sedation

## 2017-12-31 MED ORDER — GUAIFENESIN-DM 100-10 MG/5ML PO SYRP
5.0000 mL | ORAL_SOLUTION | ORAL | Status: DC | PRN
  Administered 2017-12-31: 5 mL via ORAL
  Filled 2017-12-31: qty 5

## 2017-12-31 MED ORDER — SODIUM CHLORIDE 0.9 % IV SOLN: 250.0000 mL | INTRAVENOUS | Status: DC | PRN

## 2017-12-31 MED ORDER — SODIUM CHLORIDE 0.9% FLUSH: 3.0000 mL | Freq: Two times a day (BID) | INTRAVENOUS | Status: DC

## 2017-12-31 MED ORDER — FENTANYL CITRATE (PF) 100 MCG/2ML IJ SOLN
INTRAMUSCULAR | Status: AC
Start: 2017-12-31 — End: 2017-12-31
  Filled 2017-12-31: qty 2

## 2017-12-31 MED ORDER — IOPAMIDOL (ISOVUE-300) INJECTION 61%
INTRAVENOUS | Status: DC | PRN
  Administered 2017-12-31: 40 mL via INTRA_ARTERIAL

## 2017-12-31 MED ORDER — VERAPAMIL HCL 2.5 MG/ML IV SOLN
INTRAVENOUS | Status: AC
  Filled 2017-12-31: qty 2

## 2017-12-31 MED ORDER — LOSARTAN POTASSIUM 25 MG PO TABS
12.5000 mg | ORAL_TABLET | Freq: Every day | ORAL | Status: DC
  Administered 2017-12-31: 12.5 mg via ORAL
  Filled 2017-12-31: qty 1

## 2017-12-31 MED ORDER — CARVEDILOL 3.125 MG PO TABS: 3.1250 mg | ORAL_TABLET | Freq: Two times a day (BID) | ORAL | 0 refills | Status: DC

## 2017-12-31 MED ORDER — HEPARIN (PORCINE) IN NACL 2-0.9 UNIT/ML-% IJ SOLN
INTRAMUSCULAR | Status: AC
  Filled 2017-12-31: qty 500

## 2017-12-31 MED ORDER — GUAIFENESIN-DM 100-10 MG/5ML PO SYRP: 5.0000 mL | ORAL_SOLUTION | ORAL | 0 refills | Status: DC | PRN

## 2017-12-31 MED ORDER — HEPARIN SODIUM (PORCINE) 1000 UNIT/ML IJ SOLN
INTRAMUSCULAR | Status: AC
  Filled 2017-12-31: qty 1

## 2017-12-31 MED ORDER — HEPARIN SODIUM (PORCINE) 1000 UNIT/ML IJ SOLN
INTRAMUSCULAR | Status: DC | PRN
  Administered 2017-12-31: 4000 [IU] via INTRAVENOUS

## 2017-12-31 MED ORDER — SODIUM CHLORIDE 0.9% FLUSH: 3.0000 mL | INTRAVENOUS | Status: DC | PRN

## 2017-12-31 MED ORDER — LOSARTAN POTASSIUM 25 MG PO TABS: 12.5000 mg | ORAL_TABLET | Freq: Every day | ORAL | 0 refills | Status: DC

## 2017-12-31 MED ORDER — VERAPAMIL HCL 2.5 MG/ML IV SOLN
INTRAVENOUS | Status: DC | PRN
  Administered 2017-12-31: 2.5 mg via INTRA_ARTERIAL

## 2017-12-31 MED ORDER — FUROSEMIDE 40 MG PO TABS
40.0000 mg | ORAL_TABLET | Freq: Every day | ORAL | Status: DC
  Administered 2017-12-31: 40 mg via ORAL
  Filled 2017-12-31: qty 1

## 2017-12-31 MED ORDER — MIDAZOLAM HCL 2 MG/2ML IJ SOLN
INTRAMUSCULAR | Status: DC | PRN
  Administered 2017-12-31: 1 mg via INTRAVENOUS

## 2017-12-31 MED ORDER — FUROSEMIDE 40 MG PO TABS: 40.0000 mg | ORAL_TABLET | Freq: Every day | ORAL | 0 refills | Status: DC

## 2017-12-31 MED ORDER — SODIUM CHLORIDE 0.9% FLUSH
3.0000 mL | Freq: Two times a day (BID) | INTRAVENOUS | Status: DC
  Administered 2017-12-31: 3 mL via INTRAVENOUS

## 2017-12-31 MED ORDER — POTASSIUM CHLORIDE CRYS ER 20 MEQ PO TBCR
40.0000 meq | EXTENDED_RELEASE_TABLET | Freq: Once | ORAL | Status: AC
  Administered 2017-12-31: 40 meq via ORAL
  Filled 2017-12-31: qty 2

## 2017-12-31 MED ORDER — FENTANYL CITRATE (PF) 100 MCG/2ML IJ SOLN
INTRAMUSCULAR | Status: DC | PRN
  Administered 2017-12-31: 50 ug via INTRAVENOUS

## 2017-12-31 MED ORDER — MIDAZOLAM HCL 2 MG/2ML IJ SOLN
INTRAMUSCULAR | Status: AC
  Filled 2017-12-31: qty 2

## 2017-12-31 SURGICAL SUPPLY — 10 items
CATH 5F 110X4 TIG (CATHETERS) ×3 IMPLANT
CATH BALLN WEDGE 5F 110CM (CATHETERS) ×3 IMPLANT
DEVICE RAD TR BAND REGULAR (VASCULAR PRODUCTS) ×3 IMPLANT
GLIDESHEATH SLEND SS 6F .021 (SHEATH) ×3 IMPLANT
KIT MANI 3VAL PERCEP (MISCELLANEOUS) ×3 IMPLANT
KIT RIGHT HEART (MISCELLANEOUS) ×3 IMPLANT
PACK CARDIAC CATH (CUSTOM PROCEDURE TRAY) ×3 IMPLANT
SHEATH GLIDE SLENDER 4/5FR (SHEATH) ×3 IMPLANT
WIRE HITORQ VERSACORE ST 145CM (WIRE) ×3 IMPLANT
WIRE ROSEN-J .035X260CM (WIRE) ×3 IMPLANT

## 2017-12-31 NOTE — Interval H&P Note (Signed)
History and Physical Interval Note:  12/31/2017 9:36 AM  [name]  has presented today for cardiac catheterization, with the diagnosis of acute systolic and diastolic heart failure. The various methods of treatment have been discussed with the patient and family. After consideration of risks, benefits and other options for treatment, the patient has consented to  Procedure(s): RIGHT/LEFT HEART CATH AND CORONARY ANGIOGRAPHY (N/A) as a surgical intervention .  The patient's history has been reviewed, patient examined, no change in status, stable for surgery.  I have reviewed the patient's chart and labs.  Questions were answered to the patient's satisfaction.    Cath Lab Visit (complete for each Cath Lab visit)  Clinical Evaluation Leading to the Procedure:   ACS: No.  Non-ACS:    Anginal Classification: No Symptoms (NYHA class III heart failure symptoms)  Anti-ischemic medical therapy: Minimal Therapy (1 class of medications)  Non-Invasive Test Results: No non-invasive testing performed  (LVEF 30-35% by echo).  Prior CABG: No previous CABG  Seretha Estabrooks

## 2017-12-31 NOTE — Discharge Instructions (Signed)
Acute Coronary Syndrome Acute coronary syndrome (ACS) is a serious problem in which there is suddenly not enough blood and oxygen reaching the heart. ACS can result in chest pain or a heart attack. What are the causes? This condition may be caused by:  A buildup of fat and cholesterol inside of the arteries (atherosclerosis). This is the most common cause. The buildup (plaque) can cause the blood vessels in your heart (coronary arteries) to become narrow or blocked. Plaque can also break off to form a clot.  A coronary spasm.  A tearing of the coronary artery (spontaneous coronary artery dissection).  Low blood pressure (hypotension).  An abnormal heart beat (arrhythmia).  Using cocaine or methamphetamine.  What increases the risk? The following factors may make you more likely to develop this condition:  Age.  History of chest pain, heart attack, or stroke.  Being male.  Family history of chest pain, heart disease, or stroke.  Smoking.  Inactivity.  Being overweight.  High cholesterol.  High blood pressure (hypertension).  Diabetes.  Excessive alcohol use.  What are the signs or symptoms? Common symptoms of this condition include:  Chest pain. The pain may last long, or may stop and come back (recur). It may feel like: ? Crushing or squeezing. ? Tightness, pressure, fullness, or heaviness.  Arm, neck, jaw, or back pain.  Heartburn or indigestion.  Shortness of breath.  Nausea.  Sudden cold sweats.  Lightheadedness.  Dizziness.  Tiredness (fatigue).  Sometimes there are no symptoms. How is this diagnosed? This condition may be diagnosed through:  An electrocardiogram (ECG). This test records the impulses of the heart.  Blood tests.  A CT scan of the chest.  A coronary angiogram. This procedure checks for a blockage in the coronary arteries.  How is this treated? Treatment for this condition may include:  Oxygen.  Medicines, such  as: ? Antiplatelet medicines and blood-thinning medicines, such as aspirin. These help prevent blood clots. ? Fibrinolytic therapy. This breaks apart a blood clot. ? Blood pressure medicines. ? Nitroglycerin. ? Pain medicine. ? Cholesterol medicine.  A procedure called coronary angioplasty and stenting. This is done to widen a narrowed artery and keep it open.  Coronary artery bypass surgery. This allows blood to pass the blockage to reach your heart.  Cardiac rehabilitation. This is a program that helps improve your health and well-being. It includes exercise training, education, and counseling to help you recover.  Follow these instructions at home: Eating and drinking  Follow a heart-healthy, low-salt (sodium) diet.  Use healthy cooking methods such as roasting, grilling, broiling, baking, poaching, steaming, or stir-frying.  Talk to a dietitian to learn about healthy cooking methods and how to eat less sodium. Medicines  Take over-the-counter and prescription medicines only as told by your health care provider.  Do not take these medicines unless your health care provider approves: ? Nonsteroidal anti-inflammatory drugs (NSAIDs), such as ibuprofen, naproxen, or celecoxib. ? Vitamin supplements that contain vitamin A or vitamin E. ? Hormone replacement therapy that contains estrogen. Activity  Join a cardiac rehabilitation program.  Ask your health care provider: ? What activities and exercises are safe for you. ? If you should follow specific instructions about lifting, driving, or climbing stairs.  If you are taking aspirin and another blood thinning medicine, avoid activities that are likely to result in an injury. The medicines can increase your risk of bleeding. Lifestyle  Do not use any products that contain nicotine or tobacco, such as  cigarettes and e-cigarettes. If you need help quitting, ask your health care provider.  If you drink alcohol and your health care  provider says it is okay to drink, limit your alcohol intake to no more than 1 drink per day. One drink equals 12 oz of beer, 5 oz of wine, or 1 oz of hard liquor.  Maintain a healthy weight. If you need to lose weight, do it in a way that has been approved by your health care provider. General instructions  Tell all your health care providers about your heart condition, including your dentist. Some medicines can increase your risk of arrhythmia.  Manage other health conditions, such as hypertension and diabetes. These conditions affect your heart.  Learn ways to manage stress.  Get screened for depression, and seek treatment if needed.  Monitor your blood pressure if told by your health care provider.  Keep your vaccinations up to date. Get the annual influenza vaccine.  Keep all follow-up visits as told by your health care provider. This is important. Contact a health care provider if:  You feel overwhelmed or sad.  You have trouble with your daily activities. Get help right away if:  You have pain in your chest, neck, arm, jaw, stomach, or back that recurs, and: ? Lasts more than a few minutes. ? Is not relieved by taking the [place] health care provider prescribed.  You have unexplained: ? Heavy sweating. ? Heartburn or indigestion. ? Shortness of breath. ? Difficulty breathing. ? Nausea or vomiting. ? Fatigue. ? Nervousness or anxiety. ? Weakness. ? Diarrhea. ? Dark stools or blood in the stool.  You have sudden lightheadedness or dizziness.  Your blood pressure is higher than 180/120  You faint.  You feel like hurting yourself or think about taking your own life. These symptoms may represent a serious problem that is an emergency. Do not wait to see if the symptoms will go away. Get medical help right away. Call your local emergency services (911 in the [place] Do not drive yourself to the clinic or hospital. Summary  Acute coronary syndrome (ACS) is a  when there is not enough blood and oxygen being supplied to the heart. ACS can result in chest pain or a heart attack.  Acute coronary syndrome is a medical emergency. If you have any symptoms of this condition, get help right away.  Treatment includes oxygen, medicines, and procedures to open the blocked arteries and restore blood flow. This information is not intended to replace advice given to you by your health care provider. Make sure you discuss any questions you have with your health care provider. Document Released: 12/09/2005 Document Revised: 01/10/2017 Document Reviewed: 01/10/2017 Elsevier Interactive Patient Education  2018 [employer].   Viral Respiratory Infection A viral respiratory infection is an illness that affects parts of the body used for breathing, like the lungs, nose, and throat. It is caused by a germ called a virus. Some examples of this kind of infection are:  A cold.  The flu (influenza).  A respiratory syncytial virus (RSV) infection.  How do I know if I have this infection? Most of the time this infection causes:  A stuffy or runny nose.  Yellow or green fluid in the nose.  A cough.  Sneezing.  Tiredness (fatigue).  Achy muscles.  A sore throat.  Sweating or chills.  A fever.  A headache.  How is this infection treated? If the flu is diagnosed early, it may be treated with an  antiviral medicine. This medicine shortens the length of time a person has symptoms. Symptoms may be treated with over-the-counter and prescription medicines, such as:  Expectorants. These make it easier to cough up mucus.  Decongestant nasal sprays.  Doctors do not prescribe antibiotic medicines for viral infections. They do not work with this kind of infection. How do I know if I should stay home? To keep others from getting sick, stay home if you have:  A fever.  A lasting cough.  A sore throat.  A runny nose.  Sneezing.  Muscles  aches.  Headaches.  Tiredness.  Weakness.  Chills.  Sweating.  An upset stomach (nausea).  Follow these instructions at home:  Rest as much as possible.  Take over-the-counter and prescription medicines only as told by your doctor.  Drink enough fluid to keep your pee (urine) clear or pale yellow.  Gargle with salt water. Do this 3-4 times per day or as needed. To make a salt-water mixture, dissolve -1 tsp of salt in 1 cup of warm water. Make sure the salt dissolves all the way.  Use nose drops made from salt water. This helps with stuffiness (congestion). It also helps soften the skin around your nose.  Do not drink alcohol.  Do not use tobacco products, including cigarettes, chewing tobacco, and e-cigarettes. If you need help quitting, ask your doctor. Get help if:  Your symptoms last for 10 days or longer.  Your symptoms get worse over time.  You have a fever.  You have very bad pain in your face or forehead.  Parts of your jaw or neck become very swollen. Get help right away if:  You feel pain or pressure in your chest.  You have shortness of breath.  You faint or feel like you will faint.  You keep throwing up (vomiting).  You feel confused. This information is not intended to replace advice given to you by your health care provider. Make sure you discuss any questions you have with your health care provider. Document Released: 11/21/2008 Document Revised: 05/16/2016 Document Reviewed: 05/17/2015 Elsevier Interactive Patient Education  2018 [employer].

## 2017-12-31 NOTE — Progress Notes (Addendum)
Patient admitted for elevated troponin/atypical chest pain, which was not a NSTEMI, but supply demand ischemia.   PMH GERD, Esophageal Varices, Former Smoker.    ECHO performed 12/30/2017 with the following findings:   ------------------------------------------------------------------- LV EF: 30% -   35%  ------------------------------------------------------------------- Indications:      Dyspnea 786.09.  ------------------------------------------------------------------- History:   PMH:  Esophageal varices, GERD.  ------------------------------------------------------------------- Study Conclusions  - Left ventricle: The cavity size was at the upper limits of   normal. Wall thickness was increased in a pattern of moderate   LVH. Systolic function was moderately to severely reduced. The   estimated ejection fraction was in the range of 30% to 35%.   Diffuse hypokinesis. Features are consistent with a pseudonormal   left ventricular filling pattern, with concomitant abnormal   relaxation and increased filling pressure (grade 2 diastolic   dysfunction). Doppler parameters are consistent with high   ventricular filling pressure. - Aortic valve: Trileaflet; mildly thickened leaflets. - Mitral valve: Mildly thickened leaflets . There was mild to   moderate regurgitation. - Left atrium: The atrium was mildly to moderately dilated. - Right ventricle: The cavity size was normal. Wall thickness was   normal. Systolic function was normal. - Right atrium: The atrium was mildly dilated. - Pulmonary arteries: Systolic pressure was moderately increased,   estimated to be 45 mm Hg. - Pericardium, extracardiac: A small pericardial effusion was   identified.  Procedures   RIGHT/LEFT HEART CATH AND CORONARY ANGIOGRAPHY  Conclusion   Conclusions: 1. Mild to moderate, non-obstructive coronary artery disease. 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 2. Mildly to moderately  elevated left and right heart pressures. 3. Normal Fick cardiac output.  Recommendations: 1. Continue diuresis; will switch furosemide to 40 mg PO daily. 2. Continue current dose of carvedilol; will add losartan 12.5 mg daily. 3. Medical therapy and risk factor modification to prevent progression of coronary artery disease. 4. If patient is hemodynamically stable and able to ambulate without difficulty, discharge this afternoon could be considered.  Nelva Bush, MD [facility] Pager: [phone]   Indications   Acute combined systolic and diastolic heart failure ([facility]) [I50.41 (ICD-10-CM)]   CHF Education:?? Educational session with patient completed.  ? ? Provided patient with "Living Better with Heart Failure" packet. Briefly reviewed definition of heart failure and signs and symptoms of an exacerbation.?Discussed the meaning of EF and his preliminary value as compared to normal value.??Explained to patient that HF is a chronic illness which requires self-assessment / self-management along with help from the cardiologist/PCP/HF Clinic.?? ? *Reviewed importance of and reason behind checking weight daily in the AM, after using the bathroom, but before getting dressed.?Patient has scales.  ? ? Reviewed the following information with patient:  *Discussed when to call the Dr= weight gain of >2-3lb overnight of 5lb in a week,  *Discussed yellow zone= call MD: weight gain of >2-3lb overnight of 5lb in a week, increased swelling, increased SOB when lying down, chest discomfort, dizziness, increased fatigue *Red Zone= call 911: struggle to breath, fainting or near fainting, significant chest pain  ? *Reviewed low sodium diet-provided handout of recommended and not recommended foods. ?Reviewed reading labels with patient. Discussed fluid intake with patient as well. Patient not currently on a fluid restriction, but advised no more than 8-8 ounces glass of fluids per  day.  *Instructed patient to take medications as prescribed for heart failure. Explained briefly why pt is on the medications (either make you feel better, live longer  or keep you out of the hospital) and discussed monitoring and side effects.   *Smoking Cessation?- Patient is a former smoker.  ? ? *Discussed?the benefits of exercise.  Patient encouraged to remain as active as possible.  Explained to patient that based on his EF per Medicare guidelines he is a candidate for Cardiac Rehab. Patient informed this RN that he is very [place] and plays golf.  Patient also stated he used to be in a [employer] where he would exercise up to three times per week.  Informed patient that per Medicare guidelines he must be out of the hospital and his HF well controlled for 6 weeks before starting Cardiac Rehab.    *[facility]- Explained the role of the [facility]. Explained to patient that the HF Clinic does not replace his PCP/cardiologist, but is an additional resource to help him manage his HF and to keep him out of the hospital.?Appointment in the HF Clinic is scheduled for 01/09/2018 at 10:00 a.m.  Patient encouraged to keep this appointment. Brochure and directions provided.    Once again reviewed the 5 Steps to Living Better with Heart Failure.  Patient thanked me for providing the above information. ?  Roanna Epley, RN, BSN, [facility] Cardiovascular and Pulmonary Nurse Navigator

## 2017-12-31 NOTE — Progress Notes (Signed)
Progress Note  Patient Name: Earl Hays Date of Encounter: 12/31/2017  Primary Cardiologist: New to [facility] - consult by End  Subjective   For [facility] this morning. Echo showed EF 30-35% with diffuse HK, Gr2DD, mild to moderate mitral regurgitation, moderately dilated left atrium, normal RV systolic function, PASP 45 mmHg, small pericardial effusion. Labs are pending this morning.   No chest pain. Cough unchanged. Documented UOP of 1.4 L for the admission, weight down 6 pounds for the past 24 hours.   Inpatient Medications    Scheduled Meds: . aspirin EC  81 mg Oral Daily  . benzonatate  200 mg Oral TID  . carvedilol  3.125 mg Oral BID WC  . docusate sodium  100 mg Oral BID  . folic acid  1 mg Oral Once per day on Sun Mon Tue Thu Fri Sat  . furosemide  20 mg Intravenous Daily  . heparin  5,000 Units Subcutaneous Q8H  . ipratropium-albuterol  3 mL Nebulization Q6H  . methotrexate  15 mg Oral Weekly  . pantoprazole  40 mg Oral Daily  . vitamin B-12  2,500 mcg Oral Weekly   Continuous Infusions: . sodium chloride 10 mL/hr at 12/31/17 0618   PRN Meds: acetaminophen **OR** acetaminophen, bisacodyl, guaiFENesin-dextromethorphan, HYDROcodone-acetaminophen, ondansetron **OR** ondansetron (ZOFRAN) IV, traZODone   Vital Signs    Vitals:   12/30/17 1619 12/30/17 1911 12/30/17 1933 12/31/17 0526  BP: (!) 155/89 133/87  (!) 93/56  Pulse: (!) 101 92  86  Resp: 16 19  18   Temp: 97.8 F (36.6 C) (!) 97.5 F (36.4 C)  98 F (36.7 C)  TempSrc:  Oral    SpO2: 98% 98% 99% 97%  Weight:    178 lb 14.4 oz (81.1 kg)  Height:        Intake/Output Summary (Last 24 hours) at 12/31/2017 0720 Last data filed at 12/31/2017 0636 Gross per 24 hour  Intake 723 ml  Output 2030 ml  Net -1307 ml   Filed Weights   12/29/17 2110 12/30/17 0414 12/31/17 0526  Weight: 180 lb (81.6 kg) 184 lb 3.2 oz (83.6 kg) 178 lb 14.4 oz (81.1 kg)    Telemetry    NSR with frequent PVCs, occasionally in a  pattern of ventricular bigeminy - Personally Reviewed  ECG    n/a - Personally Reviewed  Physical Exam   GEN: No acute distress.   Neck: No JVD. Cardiac: RRR, no murmurs, rubs, or gallops.  Respiratory: Clear to auscultation bilaterally.  GI: Soft, nontender, non-distended.   MS: No edema; No deformity. Chronic hyperpigmentation of the upper and lower extremities.  Neuro:  Alert and oriented x 3; Nonfocal.  Psych: Normal affect.  Labs    Chemistry Recent Labs  Lab 12/29/17 2138 12/30/17 0429  NA 137 141  K 3.4* 3.3*  CL 104 106  CO2 25 28  GLUCOSE 97 99  BUN 16 15  CREATININE 1.13 1.21  CALCIUM 8.7* 8.6*  GFRNONAA >60 57*  GFRAA >60 >60  ANIONGAP 8 7     Hematology Recent Labs  Lab 12/29/17 2138 12/30/17 0429  WBC 5.6 5.4  RBC 4.40 4.18*  HGB 13.5 12.9*  HCT 40.0 38.2*  MCV 90.7 91.4  MCH 30.6 31.0  MCHC 33.8 33.9  RDW 14.2 13.9  PLT 148* 129*    Cardiac Enzymes Recent Labs  Lab 12/29/17 2138 12/30/17 0236  TROPONINI 0.05* 0.09*   No results for input(s): TROPIPOC in the last 168 hours.  BNPNo results for input(s): BNP, PROBNP in the last 168 hours.   DDimer No results for input(s): DDIMER in the last 168 hours.   Radiology    Dg Chest 2 View  Result Date: 12/29/2017 IMPRESSION: Cardiomegaly with acute bronchitic change. Electronically Signed   By: Ashley Royalty M.D.   On: 12/29/2017 21:33   Dg Abdomen 1 View  Result Date: 12/29/2017 IMPRESSION: Increased colonic stool burden.  No bowel obstruction.  No free air. Electronically Signed   By: Ashley Royalty M.D.   On: 12/29/2017 23:27    Cardiac Studies   TTE 12/30/2017: Study Conclusions  - Left ventricle: The cavity size was at the upper limits of   normal. Wall thickness was increased in a pattern of moderate   LVH. Systolic function was moderately to severely reduced. The   estimated ejection fraction was in the range of 30% to 35%.   Diffuse hypokinesis. Features are consistent with a  pseudonormal   left ventricular filling pattern, with concomitant abnormal   relaxation and increased filling pressure (grade 2 diastolic   dysfunction). Doppler parameters are consistent with high   ventricular filling pressure. - Aortic valve: Trileaflet; mildly thickened leaflets. - Mitral valve: Mildly thickened leaflets . There was mild to   moderate regurgitation. - Left atrium: The atrium was mildly to moderately dilated. - Right ventricle: The cavity size was normal. Wall thickness was   normal. Systolic function was normal. - Right atrium: The atrium was mildly dilated. - Pulmonary arteries: Systolic pressure was moderately increased,   estimated to be 45 mm Hg. - Pericardium, extracardiac: A small pericardial effusion was   identified.  Patient Profile     77 y.o. male with history of prior upper GI bleeding with hematemesis with esophageal varices, reflux, and eczema who was admitted 1/8 with atypical chest pain and noted to have a mildly elevated troponin with echo demonstrating acute systolic and diastolic CHF.  Assessment & Plan    1. Elevated troponin: -Minimally elevated with a peak of 0.09, continue to cycle until peak -For [facility] this morning as below -ASA  2. Acute systolic and diastolic CHF: -He does not appear grossly volume overloaded  -Coreg 3.125 mg bid as BP tolerates, due to soft BP unable to titrate at this time -IV Lasix with KCl repletion -He is for [facility] this morning -Risks and benefits of cardiac catheterization have been discussed with the patient including risks of bleeding, bruising, infection, kidney damage, stroke, heart attack, and death. The patient understands these risks and is willing to proceed with the procedure. All questions have been answered and concerns listened to  3. Atypical chest pain/cough: -Recently diagnosed with viral bronchitis -Cannot rule out underlying pulmonary process given he is on methotrexate -R/LHC as above -Defer  to IM  4. Reflux: -Per IM  5. Elevated amylase/lipase: -Per IM  6. Hypokalemia: -Pending this morning  7. Anemia/thrombocytopenia: -Labs pending this morning  For questions or updates, please contact Lena Please consult [email or web address] for contact info under Cardiology/STEMI.    Signed, Christell Faith, PA-C [place] Pager: [phone] 12/31/2017, 7:20 AM

## 2017-12-31 NOTE — Telephone Encounter (Signed)
Patient currently admitted at this time. 

## 2017-12-31 NOTE — Care Management (Signed)
No discharge needs identified by members of the care team.  Patient denies issues getting to MD appointments. Verbally confirms he has medication coverage with his Medicare [employer] policy.  Current with his pcp mark Sabra Heck

## 2017-12-31 NOTE — Telephone Encounter (Signed)
TCM....  Patient is being discharged   They saw Dr End in hospital  They are scheduled to see Dr End  on  01/14/18  They were seen for N stemi  They need to be seen within 2 weeks    Please call

## 2018-01-01 NOTE — Telephone Encounter (Signed)
Patient contacted regarding discharge from [facility] on 12/31/17.   Patient understands to follow up with provider ? On 01/14/18 at 3:20 pm at [facility] with Dr End.  Patient understands discharge instructions? Yes   Patient understands medications and regiment? Yes   Patient understands to bring all medications to this visit? Yes

## 2018-01-04 NOTE — Discharge Summary (Signed)
[place] [facility] [place] NAME: Earl Hays    MR#:  [id number]  DATE OF BIRTH:  03/12/1941  DATE OF ADMISSION:  12/29/2017   ADMITTING PHYSICIAN: Amelia Jo, MD  DATE OF DISCHARGE: 12/31/2017  6:08 PM  PRIMARY CARE PHYSICIAN: Earl Aus, MD   ADMISSION DIAGNOSIS:  Shortness of breath [R06.02] Cough [R05] Abdominal distention [R14.0] NSTEMI (non-ST elevated myocardial infarction) ([place]) [I21.4] DISCHARGE DIAGNOSIS:  Active Problems:   Cough   Chest pain   Acute combined systolic and diastolic heart failure ([place])  SECONDARY DIAGNOSIS:   Past Medical History:  Diagnosis Date  . GERD (gastroesophageal reflux disease)   . Varices, esophageal [facility])    HOSPITAL COURSE:  77 y.o. male with history of prior upper GI bleeding with hematemesis with esophageal varices, reflux, and eczema admitted on 1/8 with atypical chest pain and noted to have a mildly elevated troponin with echo demonstrating acute systolic and diastolic CHF.  1. Elevated troponin: -Minimally elevated with a peak of 0.09, due to demand ischemia in the setting of nonischemic cardiomyopathy  2. Acute systolic and diastolic CHF: -He does not appear grossly volume overloaded  -Coreg 3.125 mg bid as BP tolerates, due to soft BP unable to titrate at this time - Nonischemic cardiomyopathy based on the results of his catheterization.   - transition to oral furosemide 40 mg daily at D/C, as he is minimally symptomatic.   - losartan 12.5 mg daily at D/C.    3. Atypical chest pain/cough: -Recently diagnosed with viral bronchitis -symptomatic mgmt -   Hopefully, Earl Hays's cough will improve with diuresis.  However, if it persists, pulmonary evaluation may be necessary.   4. Reflux: -on protonix  5. Elevated amylase/lipase: -lab elevation without any symptoms  6. Hypokalemia: -Repleted  7. Anemia/thrombocytopenia: -stable DISCHARGE CONDITIONS:   stable CONSULTS OBTAINED:  Treatment Team:  Nelva Bush, MD DRUG ALLERGIES:   Allergies  Allergen Reactions  . Lamisil [Terbinafine] Itching and Rash   DISCHARGE MEDICATIONS:   Allergies as of 12/31/2017      Reactions   Lamisil [terbinafine] Itching, Rash      Medication List    TAKE these medications   aspirin EC 81 MG tablet Take 81 mg by mouth daily.   carvedilol 3.125 MG tablet Commonly known as:  COREG Take 1 tablet (3.125 mg total) by mouth 2 (two) times daily with a meal.   Cyanocobalamin 5000 MCG/ML Liqd Place 10,000 mcg under the tongue once a week.   folic acid 1 MG tablet Commonly known as:  FOLVITE Take 1 mg by mouth daily. Pt takes 1mg  everyday except on wed (methotrexate day)   furosemide 40 MG tablet Commonly known as:  LASIX Take 1 tablet (40 mg total) by mouth daily.   guaiFENesin-dextromethorphan 100-10 MG/5ML syrup Commonly known as:  ROBITUSSIN DM Take 5 mLs by mouth every 4 (four) hours as needed for cough.   losartan 25 MG tablet Commonly known as:  COZAAR Take 0.5 tablets (12.5 mg total) by mouth daily.   methotrexate 2.5 MG tablet Commonly known as:  RHEUMATREX Take 4 tablets by mouth once a week. Pt takes 4 tabs on wed. Dr increased it to 6 tabs at last vist, but pt has only been taking the 4   pantoprazole 40 MG tablet Commonly known as:  PROTONIX Take 40 mg by mouth daily.        DISCHARGE INSTRUCTIONS:  Earl Hays will need a  repeat basic metabolic panel in about 1 week.  DIET:  Cardiac diet DISCHARGE CONDITION:  Stable ACTIVITY:  Activity as tolerated OXYGEN:  Home Oxygen: No.  Oxygen Delivery: room air DISCHARGE LOCATION:  home   If you experience worsening of your admission symptoms, develop shortness of breath, life threatening emergency, suicidal or homicidal thoughts you must seek medical attention immediately by calling 911 or calling your MD immediately  if symptoms less severe.  You Must read complete  instructions/literature along with all the possible adverse reactions/side effects for all the Medicines you take and that have been prescribed to you. Take any new Medicines after you have completely understood and accpet all the possible adverse reactions/side effects.   Please note  You were cared for by a hospitalist during your hospital stay. If you have any questions about your discharge medications or the care you received while you were in the hospital after you are discharged, you can call the unit and asked to speak with the hospitalist on call if the hospitalist that took care of you is not available. Once you are discharged, your primary care physician will handle any further medical issues. Please note that NO REFILLS for any discharge medications will be authorized once you are discharged, as it is imperative that you return to your primary care physician (or establish a relationship with a primary care physician if you do not have one) for your aftercare needs so that they can reassess your need for medications and monitor your lab values.    On the day of Discharge:  VITAL SIGNS:  Blood pressure 118/71, pulse 85, temperature 97.9 F (36.6 C), temperature source Oral, resp. rate 20, height 5\' 10"  (1.778 m), weight 81.1 kg (178 lb 14.4 oz), SpO2 99 %. PHYSICAL EXAMINATION:  GENERAL:  77 y.o.-year-old patient lying in the bed with no acute distress.  EYES: Pupils equal, round, reactive to light and accommodation. No scleral icterus. Extraocular muscles intact.  HEENT: Head atraumatic, normocephalic. Oropharynx and nasopharynx clear.  NECK:  Supple, no jugular venous distention. No thyroid enlargement, no tenderness.  LUNGS: Normal breath sounds bilaterally, no wheezing, rales,rhonchi or crepitation. No use of accessory muscles of respiration.  CARDIOVASCULAR: S1, S2 normal. No murmurs, rubs, or gallops.  ABDOMEN: Soft, non-tender, non-distended. Bowel sounds present. No organomegaly  or mass.  EXTREMITIES: No pedal edema, cyanosis, or clubbing.  NEUROLOGIC: Cranial nerves II through XII are intact. Muscle strength 5/5 in all extremities. Sensation intact. Gait not checked.  PSYCHIATRIC: The patient is alert and oriented x 3.  SKIN: No obvious rash, lesion, or ulcer.  DATA REVIEW:   CBC Recent Labs  Lab 12/31/17 0824  WBC 7.4  HGB 13.9  HCT 41.3  PLT 136*    Chemistries  Recent Labs  Lab 12/31/17 0824  NA 137  K 3.6  CL 103  CO2 24  GLUCOSE 69  BUN 17  CREATININE 1.12  CALCIUM 9.2     Follow-up Information    Earl Aus, MD. Go on 01/07/2018.   Specialty:  Internal Medicine Why:  Appointment Time: 8:30am Contact information: [address] [place] [address] [phone]        Nelva Bush, MD. Go on 01/14/2018.   Specialty:  Cardiology Why:  Appointment Time: 3:20pm Contact information: [place] [address] [phone]           Management plans discussed with the patient, family and they are  in agreement.  CODE STATUS: Prior   TOTAL TIME TAKING CARE OF THIS PATIENT: 45 minutes.    Max Sane M.D on 01/04/2018 at 8:26 PM  Between 7am to 6pm - Pager - [phone]  After 6pm go to [email or web address] - Proofreader  [facility]  Office  [phone]  CC: Primary care physician; Earl Aus, MD   Note: This dictation was prepared with Dragon dictation along with smaller phrase technology. Any transcriptional errors that result from this process are unintentional.

## 2018-01-05 ENCOUNTER — Encounter: Admit: 2018-01-05 | Discharge: 2018-01-06 | Payer: BLUE CROSS/BLUE SHIELD

## 2018-01-05 DIAGNOSIS — L309 Dermatitis, unspecified: Principal | ICD-10-CM

## 2018-01-07 ENCOUNTER — Encounter: Admit: 2018-01-07 | Discharge: 2018-01-08 | Payer: BLUE CROSS/BLUE SHIELD

## 2018-01-07 ENCOUNTER — Ambulatory Visit
Admission: RE | Admit: 2018-01-07 | Discharge: 2018-01-07 | Disposition: A | Discharge status: Home / Self Care | Payer: Medicare PPO | Source: Ambulatory Visit | Attending: Internal Medicine | Admitting: Internal Medicine

## 2018-01-07 ENCOUNTER — Other Ambulatory Visit: Payer: Self-pay | Admitting: Internal Medicine

## 2018-01-07 DIAGNOSIS — L309 Dermatitis, unspecified: Principal | ICD-10-CM

## 2018-01-07 DIAGNOSIS — R0600 Dyspnea, unspecified: Secondary | ICD-10-CM

## 2018-01-07 DIAGNOSIS — I2510 Atherosclerotic heart disease of native coronary artery without angina pectoris: Secondary | ICD-10-CM | POA: Diagnosis not present

## 2018-01-07 DIAGNOSIS — I313 Pericardial effusion (noninflammatory): Secondary | ICD-10-CM | POA: Diagnosis not present

## 2018-01-07 HISTORY — DX: Heart failure, unspecified: I50.9

## 2018-01-07 MED ORDER — IOPAMIDOL (ISOVUE-370) INJECTION 76%
75.0000 mL | Freq: Once | INTRAVENOUS | Status: AC | PRN
  Administered 2018-01-07: 75 mL via INTRAVENOUS

## 2018-01-07 NOTE — Progress Notes (Signed)
Patient ID: Earl Hays, male    DOB: 1940-12-24, 77 y.o.   MRN: [id number]  HPI  Earl Hays is a 77 y/o male with a history of GERD, HTN, eczema, previous tobacco use and chronic heart failure.   Echo report done on 12/30/17 reviewed and showed an EF of 30-35% along with mild/mod Earl and moderately increased PA pressure of 45 mm Hg. Cardiac catheterization done 12/31/17 showed mild-moderate non-obstructive CAD along with elevated left and right pressures.  Admitted 12/29/17 due to acute heart failure. Initially needed IV diuretics and then transitioned to oral diuretics. Cardiology consult done. Elevated troponin thought to be due to demand ischemia. Discharged after 2 days.  He presents today for his initial visit with a chief complaint of a dry, hacking cough. He says that this has been present for about 5 weeks now and doesn't seem to be any better. Currently taking prednisone and has also taken antibiotics for this. He has associated abdominal pain (from coughing) and difficulty sleeping from coughing so hard. He denies any fatigue, chest pain, shortness of breath, wheezing, edema, palpitations, dizziness or weight gain.   Past Medical History:  Diagnosis Date  . CHF (congestive heart failure) ([place])   . GERD (gastroesophageal reflux disease)   . Hypertension   . Varices, esophageal ([place])    Past Surgical History:  Procedure Laterality Date  . CATARACT EXTRACTION W/PHACO Left 01/21/2017   Procedure: CATARACT EXTRACTION PHACO AND INTRAOCULAR LENS PLACEMENT (IOC);  Surgeon: Birder Robson, MD;  Location: [facility] ORS;  Service: Ophthalmology;  Laterality: Left;  [place] 00:48 AP% 21.6 CDE 10.39 Fluid pack lot # [id number]  . CATARACT EXTRACTION W/PHACO Right 02/11/2017   Procedure: CATARACT EXTRACTION PHACO AND INTRAOCULAR LENS PLACEMENT (IOC);  Surgeon: Birder Robson, MD;  Location: [facility] ORS;  Service: Ophthalmology;  Laterality: Right;  [place] 00:42 AP% 22.4 CDE 9.47 Fluid pack lot # [id number] H  .  COLONOSCOPY    . JOINT REPLACEMENT     TKR  . REPLACEMENT TOTAL KNEE BILATERAL Bilateral   . RIGHT/LEFT HEART CATH AND CORONARY ANGIOGRAPHY N/A 12/31/2017   Procedure: RIGHT/LEFT HEART CATH AND CORONARY ANGIOGRAPHY;  Surgeon: Nelva Bush, MD;  Location: [place] [facility];  Service: Cardiovascular;  Laterality: N/A;   Family History  Problem Relation Age of Onset  . Brain cancer Mother    Social History   Tobacco Use  . Smoking status: Former Smoker    Types: Cigarettes    Last attempt to quit: 2004    Years since quitting: 15.0  . Smokeless tobacco: Never Used  Substance Use Topics  . Alcohol use: Yes    Alcohol/week: 0.6 oz    Types: 1 Cans of beer per week    Frequency: Never    Comment: 3-4 weekly   Allergies  Allergen Reactions  . Lamisil [Terbinafine] Itching and Rash   Prior to Admission medications   Medication Sig Start Date End Date Taking? Authorizing Provider  aspirin EC 81 MG tablet Take 81 mg by mouth daily.   Yes [provider]  carvedilol (COREG) 3.125 MG tablet Take 1 tablet (3.125 mg total) by mouth 2 (two) times daily with a meal. 12/31/17  Yes Max Sane, MD  folic acid (FOLVITE) 1 MG tablet Take 1 mg by mouth daily. Pt takes 1mg  everyday except on wed (methotrexate day)   Yes [provider]  furosemide (LASIX) 40 MG tablet Take 1 tablet (40 mg total) by mouth daily. 01/01/18  Yes Manuella Ghazi, Vipul,  MD  losartan (COZAAR) 25 MG tablet Take 0.5 tablets (12.5 mg total) by mouth daily. 01/01/18  Yes Max Sane, MD  methotrexate (RHEUMATREX) 2.5 MG tablet Take 6 tablets by mouth once a week.  12/10/17  Yes [provider]  pantoprazole (PROTONIX) 40 MG tablet Take 40 mg by mouth daily as needed.    Yes [provider]    Review of Systems  Constitutional: Negative for appetite change and fatigue.  HENT: Negative for congestion, postnasal drip and sore throat.   Eyes: Negative.   Respiratory: Positive for cough (dry).  Negative for chest tightness and shortness of breath.   Cardiovascular: Negative for chest pain, palpitations and leg swelling.  Gastrointestinal: Positive for abdominal pain (when coughing). Negative for abdominal distention.  Endocrine: Negative.   Genitourinary: Negative.   Musculoskeletal: Negative for back pain and neck pain.  Skin: Negative.   Allergic/Immunologic: Negative.   Neurological: Negative for dizziness and light-headedness.  Hematological: Negative for adenopathy. Does not bruise/bleed easily.  Psychiatric/Behavioral: Positive for sleep disturbance (due to coughing). Negative for dysphoric mood. The patient is not nervous/anxious.     Vitals:   01/09/18 1023  BP: 113/63  Pulse: 80  Resp: (!) 22  Temp: 97.9 F (36.6 C)  TempSrc: Oral  SpO2: 100%  Weight: 180 lb (81.6 kg)  Height: 5\' 10"  (1.778 m)   Wt Readings from Last 3 Encounters:  01/09/18 180 lb (81.6 kg)  12/31/17 178 lb 14.4 oz (81.1 kg)  02/11/17 178 lb (80.7 kg)   Lab Results  Component Value Date   CREATININE 1.12 12/31/2017   CREATININE 1.21 12/30/2017   CREATININE 1.13 12/29/2017    Physical Exam  Constitutional: He is oriented to person, place, and time. He appears well-developed and well-nourished.  HENT:  Head: Normocephalic and atraumatic.  Neck: Normal range of motion. Neck supple. No JVD present.  Cardiovascular: Normal rate and regular rhythm.  Pulmonary/Chest: Effort normal. He has no wheezes. He has no rales.  Abdominal: He exhibits no distension. There is no tenderness.  Musculoskeletal: He exhibits no edema or tenderness.  Neurological: He is alert and oriented to person, place, and time.  Skin: Skin is warm and dry.  Psychiatric: He has a normal mood and affect. His behavior is normal. Thought content normal.  Nursing note and vitals reviewed.   Assessment & Plan:  1: Chronic heart failure with reduced ejection fraction- - NYHA class I - euvolemic today - weighing daily  and says that his weight has been stable. Instructed to call for an overnight weight gain of >2 pounds or a weekly weight gain of >5 pounds - not adding salt to his food. Discussed the importance of reading food labels so that he can follow a 2000mg  sodium diet. Written dietary information was given to him about this - doubt that his BP could tolerate entresto at this time - could consider titrating up his carvedilol in the future - sees cardiology (End) 01/14/18 - Pharm D reconciled medications with the patient  2: HTN- - BP on the low side but patient without dizziness - BMP from 12/31/17 reviewed and showed sodium 137, potassium 3.6 and GFR >60 - saw PCP Sabra Heck) 01/07/17  3: GERD- - patient says that he used to take protonix daily but hasn't been taking it lately - chronic cough for the last 5 weeks - advised patient to resume taking his protonix daily for the next week and see if his cough improves  Patient did not  bring his medications nor a list. Each medication was verbally reviewed with the patient and he was encouraged to bring the bottles to every visit to confirm accuracy of list.  Return in 1 month or sooner for any questions/problems before then.

## 2018-01-09 ENCOUNTER — Encounter: Payer: Self-pay | Admitting: Family

## 2018-01-09 ENCOUNTER — Other Ambulatory Visit: Payer: Self-pay

## 2018-01-09 ENCOUNTER — Ambulatory Visit: Payer: Medicare PPO | Attending: Family | Admitting: Family

## 2018-01-09 DIAGNOSIS — Z87891 Personal history of nicotine dependence: Secondary | ICD-10-CM | POA: Insufficient documentation

## 2018-01-09 DIAGNOSIS — I110 Hypertensive heart disease with heart failure: Secondary | ICD-10-CM | POA: Insufficient documentation

## 2018-01-09 DIAGNOSIS — K219 Gastro-esophageal reflux disease without esophagitis: Secondary | ICD-10-CM | POA: Diagnosis not present

## 2018-01-09 DIAGNOSIS — I5022 Chronic systolic (congestive) heart failure: Secondary | ICD-10-CM | POA: Insufficient documentation

## 2018-01-09 DIAGNOSIS — Z7982 Long term (current) use of aspirin: Secondary | ICD-10-CM | POA: Insufficient documentation

## 2018-01-09 DIAGNOSIS — I10 Essential (primary) hypertension: Secondary | ICD-10-CM | POA: Insufficient documentation

## 2018-01-09 DIAGNOSIS — Z79899 Other long term (current) drug therapy: Secondary | ICD-10-CM | POA: Diagnosis not present

## 2018-01-09 NOTE — Patient Instructions (Addendum)
Continue weighing daily and call for an overnight weight gain of > 2 pounds or a weekly weight gain of >5 pounds.  Take your protonix daily for the next week to see if your cough improves.

## 2018-01-13 NOTE — Progress Notes (Signed)
Follow-up Outpatient Visit Date: 01/14/2018  Primary Care Provider: Rusty Aus, MD [place] [id number]  Chief Complaint: Cough  HPI:  Earl Hays is a 77 y.o. year-old male with history of recently diagnosed systolic and diastolic heart failure (LVEF 30-35%), esophageal varices with upper GI bleed, and eczema on methotrexate, who presents for follow-up of heart failure. He presented to [facility] ED for evaluation of a cough for ~1 week, and associated chest pain. Work-up was notable for mildly elevated troponin and moderate to severe LV dysfunction..Cardiac catheterization revealed non-obstructive coronary artery disease, consistent with non-ischemic cardiomyopathy. Left and right heart filling pressures were mildly to moderately elevated. He was discharged on furosemide, carvedilol, and losartan. He was seen in the heart failure clinic by [facility] last week, at which time he reported continued cough. He was advised to restart pantoprazole in case GERD was contributing to his cough. No other medication changes were made.  Today, Earl Hays reports feeling well with the exception of continued cough.  It has improved somewhat, however.  He was recently evaluated by his PCP as well as by Dr. Raul Del of pulmonology.  He was started on Flonase, prednisone, and as needed albuterol.  He denies chest pain, palpitations, orthopnea, and edema.  He has noted some mild lightheadedness from time to time, which is not positional.  He has lost quite a bit of weight over the last 2 weeks, which he attributes to diuresis.  He is weighing himself every morning.  He is also avoiding salt.  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 LAD with 40% proximal and 20% mid disease. Small RI with 60% proximal stenosis. LCS with 20% mid/distal stenosis. Normal RCA. LVEDP 25 mmHg. RA 10, RV 40/14, PA 40/21 (27), PCWP 22. Ao sat 94%, PA sat 74%, Fick CO/CI 6.5/3.7.  CV Surgery:  None  EP Procedures and Devices:  None  Non-Invasive Evaluation(s):  TTE (12/30/17): Upper normal LV size with moderate LVH. LVEF 30-35% with diffuse hypokinesis. Grade 2 diastolic dysfunction with elevated filling pressure. Aortic sclerosis and degenerative mitral valve disease. Mild to moderate MR. Mild to moderate LAE. Normal RV size and function. Moderate PH. Small pericardial effusion.  Recent CV Pertinent Labs: Lab Results  Component Value Date   INR 0.93 12/31/2017   INR 1.1 01/24/2014   K 3.6 12/31/2017   K 3.8 01/24/2014   BUN 17 12/31/2017   BUN 21 (H) 01/24/2014   CREATININE 1.12 12/31/2017   CREATININE 1.13 01/24/2014    Past medical and surgical history were reviewed and updated in EPIC.  Current Meds  Medication Sig  . aspirin EC 81 MG tablet Take 81 mg by mouth daily.  . carvedilol (COREG) 3.125 MG tablet Take 1 tablet (3.125 mg total) by mouth 2 (two) times daily with a meal.  . folic acid (FOLVITE) 1 MG tablet Take 1 mg by mouth daily. Pt takes 1mg  everyday except on wed (methotrexate day)  . furosemide (LASIX) 40 MG tablet Take 1 tablet (40 mg total) by mouth daily.  Marland Kitchen losartan (COZAAR) 25 MG tablet Take 0.5 tablets (12.5 mg total) by mouth daily.  . methotrexate (RHEUMATREX) 2.5 MG tablet Take 6 tablets by mouth once a week.   Marland Kitchen  pantoprazole (PROTONIX) 40 MG tablet Take 40 mg by mouth daily as needed.     Allergies: Lamisil [terbinafine]  Social History   Socioeconomic History  . Marital status: Single    Spouse name: Not on file  . Number of children: Not on file  . Years of  education: Not on file  . Highest education level: Not on file  Social Needs  . Financial resource strain: Not hard at all  . Food insecurity - worry: Never true  . Food insecurity - inability: Never true  . Transportation needs - medical: No  . Transportation needs - non-medical: No  Occupational History  . Occupation: retired  Tobacco Use  . Smoking status: Former Smoker    Types: Cigarettes    Last attempt to quit: 2004    Years since quitting: 15.0  . Smokeless tobacco: Never Used  . Tobacco comment: Quit in 2004  Substance and Sexual Activity  . Alcohol use: Yes    Alcohol/week: 0.6 oz    Types: 1 Cans of beer per week    Frequency: Never    Comment: Every now and then  . Drug use: No  . Sexual activity: No  Other Topics Concern  . Not on file  Social History Narrative  . Not on file    Family History  Problem Relation Age of Onset  . Brain cancer Mother     Review of Systems: A 12-system review of systems was performed and was negative except as noted in the HPI.  --------------------------------------------------------------------------------------------------  Physical Exam: BP 124/60 (BP Location: Left Arm, Patient Position: Sitting, Cuff Size: Normal)   Pulse 75   Ht 5\' 10"  (1.778 m)   Wt 175 lb 4 oz (79.5 kg)   BMI 25.15 kg/m   General: Well-developed, well-nourished man, seated comfortably in the exam room. HEENT: No conjunctival pallor or scleral icterus. Moist mucous membranes.  OP clear. Neck: Supple without lymphadenopathy, thyromegaly, JVD, or HJR. Lungs: Normal work of breathing.  Coarse breath sounds bilaterally without wheezes or crackles. Heart: Regular rate and rhythm without murmurs, rubs, or gallops. Non-displaced PMI. Abd: Bowel sounds present. Soft, NT/ND without hepatosplenomegaly Ext: No lower extremity edema. Radial, PT, and DP pulses are 2+ bilaterally.  Right radial arteriotomy site is well-healed. Skin: Warm and dry without  rash.  EKG: Normal sinus rhythm with nonspecific T wave changes.  Lab Results  Component Value Date   WBC 7.4 12/31/2017   HGB 13.9 12/31/2017   HCT 41.3 12/31/2017   MCV 91.8 12/31/2017   PLT 136 (L) 12/31/2017    Lab Results  Component Value Date   NA 137 12/31/2017   K 3.6 12/31/2017   CL 103 12/31/2017   CO2 24 12/31/2017   BUN 17 12/31/2017   CREATININE 1.12 12/31/2017   GLUCOSE 69 12/31/2017   ALT 23 01/24/2014    No results found for: CHOL, HDL, LDLCALC, LDLDIRECT, TRIG, CHOLHDL  --------------------------------------------------------------------------------------------------  ASSESSMENT AND PLAN: Chronic systolic heart failure Earl Hays appears euvolemic and well compensated with NYHA class II symptoms  In the setting of recently diagnosed nonischemic cardiomyopathy with LVEF around 30-35%.  He is tolerating low-dose carvedilol and losartan well.  I will check a basic metabolic panel today to ensure stable renal function and potassium.  Assuming these are normal, will increase losartan to 25 mg daily.  We may need to back off on his lisinopril, as Earl Hays appears euvolemic today.  However, we will defer making this change until  after reviewing his basic metabolic panel.  Earl Hays should follow-up as scheduled in the heart failure clinic on 02/03/18.  Nonobstructive coronary artery disease Continue risk factor modification.  I will plan to check a lipid panel when he returns in 2 weeks.  Chronic cough Somewhat improved since leaving the hospital.  Some of this may be due to improvement in fluid retention, though I suspect primary pulmonary pathology was the likely cause.  I will defer ongoing management to Drs. Sabra Heck and [employer].  Follow-up: Return to clinic in 3 months.  Nelva Bush, MD 01/14/2018 3:31 PM

## 2018-01-14 ENCOUNTER — Encounter: Admit: 2018-01-14 | Discharge: 2018-01-15 | Payer: BLUE CROSS/BLUE SHIELD

## 2018-01-14 ENCOUNTER — Other Ambulatory Visit
Admission: RE | Admit: 2018-01-14 | Discharge: 2018-01-14 | Disposition: A | Discharge status: Home / Self Care | Payer: Medicare PPO | Source: Ambulatory Visit | Attending: Internal Medicine | Admitting: Internal Medicine

## 2018-01-14 ENCOUNTER — Ambulatory Visit (INDEPENDENT_AMBULATORY_CARE_PROVIDER_SITE_OTHER): Payer: Medicare PPO | Admitting: Internal Medicine

## 2018-01-14 ENCOUNTER — Encounter: Payer: Self-pay | Admitting: Internal Medicine

## 2018-01-14 VITALS — BP 124/60 | HR 75 | Ht 70.0 in | Wt 175.2 lb

## 2018-01-14 DIAGNOSIS — L309 Dermatitis, unspecified: Principal | ICD-10-CM

## 2018-01-14 DIAGNOSIS — Z79899 Other long term (current) drug therapy: Secondary | ICD-10-CM | POA: Diagnosis not present

## 2018-01-14 DIAGNOSIS — R05 Cough: Secondary | ICD-10-CM | POA: Diagnosis not present

## 2018-01-14 DIAGNOSIS — I5022 Chronic systolic (congestive) heart failure: Secondary | ICD-10-CM

## 2018-01-14 DIAGNOSIS — I2510 Atherosclerotic heart disease of native coronary artery without angina pectoris: Secondary | ICD-10-CM | POA: Diagnosis not present

## 2018-01-14 DIAGNOSIS — R053 Chronic cough: Secondary | ICD-10-CM

## 2018-01-14 LAB — BASIC METABOLIC PANEL
Anion gap: 11 (ref 5–15)
BUN: 23 mg/dL — ABNORMAL HIGH (ref 6–20)
CO2: 28 mmol/L (ref 22–32)
Calcium: 9 mg/dL (ref 8.9–10.3)
Chloride: 98 mmol/L — ABNORMAL LOW (ref 101–111)
Creatinine, Ser: 1.29 mg/dL — ABNORMAL HIGH (ref 0.61–1.24)
GFR calc Af Amer: >60 mL/min (ref >60)
GFR calc non Af Amer: 53 mL/min — ABNORMAL LOW (ref >60)
Glucose, Bld: 123 mg/dL — ABNORMAL HIGH (ref 65–99)
Potassium: 4.8 mmol/L (ref 3.5–5.1)
Sodium: 137 mmol/L (ref 135–145)

## 2018-01-14 NOTE — Addendum Note (Signed)
Addended by: Janan Ridge on: 01/14/2018 03:49 PM   Modules accepted: Orders

## 2018-01-14 NOTE — Patient Instructions (Signed)
Medication Instructions:  Your physician has recommended you make the following change in your medication:  1- INCREASE Losartan to 25 mg by mouth once a day if your lab work comes back ok. We will call you tomorrow to let you know once we have the results.   Labwork: 1- Your physician recommends that you return for lab work in: [place] [facility]).   2- Your physician recommends that you return for lab work in: 2 weeks on February 6th at the [facility].  - Please go to the [facility]. You will check in at the front desk to the right as you walk into the atrium. Valet Parking is offered if needed.    Testing/Procedures: none  Follow-Up: Your physician recommends that you schedule a follow-up appointment in: 3 MONTHS WITH DR END.    If you need a refill on your cardiac medications before your next appointment, please call your pharmacy.

## 2018-01-15 ENCOUNTER — Other Ambulatory Visit: Payer: Self-pay | Admitting: *Deleted

## 2018-01-15 ENCOUNTER — Telehealth: Payer: Self-pay

## 2018-01-15 DIAGNOSIS — Z13220 Encounter for screening for lipoid disorders: Secondary | ICD-10-CM

## 2018-01-15 DIAGNOSIS — Z79899 Other long term (current) drug therapy: Secondary | ICD-10-CM

## 2018-01-15 DIAGNOSIS — I5022 Chronic systolic (congestive) heart failure: Secondary | ICD-10-CM

## 2018-01-15 MED ORDER — LOSARTAN POTASSIUM 25 MG PO TABS: 25.0000 mg | ORAL_TABLET | Freq: Every day | ORAL | 3 refills | Status: DC

## 2018-01-15 NOTE — Telephone Encounter (Signed)
Can you please take a look at this. I seen you sent in a refill but patient states the pharmacy is telling him he can not get it refilled until 2/3

## 2018-01-15 NOTE — Telephone Encounter (Signed)
Pt states the medication that was called in this morning, the pharmacy is telling him he cannot get this refilled until after 2/3. Please call.

## 2018-01-15 NOTE — Telephone Encounter (Signed)
S/w patient. He was under the impression he was to refill his current Rx for Losartan and there were no refills. Let patient knkow I sent a new Rx over and he should be able to get that one filled now. Patient stated he does have about 15 pills left of current Rx which he could take the whole pill to equal the correct dose. He was appreciative.

## 2018-01-16 ENCOUNTER — Ambulatory Visit: Admit: 2018-01-16 | Discharge: 2018-01-17 | Payer: BLUE CROSS/BLUE SHIELD

## 2018-01-16 DIAGNOSIS — L309 Dermatitis, unspecified: Principal | ICD-10-CM

## 2018-01-19 ENCOUNTER — Ambulatory Visit: Admit: 2018-01-19 | Discharge: 2018-01-20 | Payer: BLUE CROSS/BLUE SHIELD

## 2018-01-19 DIAGNOSIS — L309 Dermatitis, unspecified: Principal | ICD-10-CM

## 2018-01-21 ENCOUNTER — Ambulatory Visit: Admit: 2018-01-21 | Discharge: 2018-01-22 | Payer: BLUE CROSS/BLUE SHIELD

## 2018-01-21 DIAGNOSIS — L309 Dermatitis, unspecified: Principal | ICD-10-CM

## 2018-02-02 NOTE — Progress Notes (Signed)
Patient ID: Earl Hays, male    DOB: 07/10/1941, 77 y.o.   MRN: [id number]  HPI  Earl Hays is a 77 y/o male with a history of GERD, HTN, eczema, previous tobacco use and chronic heart failure.   Echo report done on 12/30/17 reviewed and showed an EF of 30-35% along with mild/mod Earl and moderately increased PA pressure of 45 mm Hg. Cardiac catheterization done 12/31/17 showed mild-moderate non-obstructive CAD along with elevated left and right pressures.  Admitted 12/29/17 due to acute heart failure. Initially needed IV diuretics and then transitioned to oral diuretics. Cardiology consult done. Elevated troponin thought to be due to demand ischemia. Discharged after 2 days.  He presents today for a follow-up visit with a chief complaint of a minimal amount of fatigue upon moderate exertion. He says that his energy level is improving. He has chronic difficulty sleeping. He denies any shortness of breath, cough, chest pain, edema, palpitations, abdominal distention, dizziness or weight gain. He has been out of losartan for a few days and hasn't taken any of his medications yet today.   Past Medical History:  Diagnosis Date  . CHF (congestive heart failure) ([place])   . GERD (gastroesophageal reflux disease)   . Hypertension   . Varices, esophageal ([place])    Past Surgical History:  Procedure Laterality Date  . CATARACT EXTRACTION W/PHACO Left 01/21/2017   Procedure: CATARACT EXTRACTION PHACO AND INTRAOCULAR LENS PLACEMENT (IOC);  Surgeon: Birder Robson, MD;  Location: [facility] ORS;  Service: Ophthalmology;  Laterality: Left;  [place] 00:48 AP% 21.6 CDE 10.39 Fluid pack lot # [id number]  . CATARACT EXTRACTION W/PHACO Right 02/11/2017   Procedure: CATARACT EXTRACTION PHACO AND INTRAOCULAR LENS PLACEMENT (IOC);  Surgeon: Birder Robson, MD;  Location: [facility] ORS;  Service: Ophthalmology;  Laterality: Right;  [place] 00:42 AP% 22.4 CDE 9.47 Fluid pack lot # [id number]  . COLONOSCOPY    . JOINT REPLACEMENT     TKR   . REPLACEMENT TOTAL KNEE BILATERAL Bilateral   . RIGHT/LEFT HEART CATH AND CORONARY ANGIOGRAPHY N/A 12/31/2017   Procedure: RIGHT/LEFT HEART CATH AND CORONARY ANGIOGRAPHY;  Surgeon: Nelva Bush, MD;  Location: [place] [facility];  Service: Cardiovascular;  Laterality: N/A;   Family History  Problem Relation Age of Onset  . Brain cancer Mother    Social History   Tobacco Use  . Smoking status: Former Smoker    Types: Cigarettes    Last attempt to quit: 2004    Years since quitting: 15.1  . Smokeless tobacco: Never Used  . Tobacco comment: Quit in 2004  Substance Use Topics  . Alcohol use: Yes    Alcohol/week: 0.6 oz    Types: 1 Cans of beer per week    Frequency: Never    Comment: Every now and then   Allergies  Allergen Reactions  . Lamisil [Terbinafine] Itching and Rash   Prior to Admission medications   Medication Sig Start Date End Date Taking? Authorizing Provider  aspirin EC 81 MG tablet Take 81 mg by mouth daily.   Yes [provider]  carvedilol (COREG) 3.125 MG tablet Take 1 tablet (3.125 mg total) by mouth 2 (two) times daily with a meal. 12/31/17  Yes Max Sane, MD  doxepin (SINEQUAN) 25 MG capsule Take 25 mg by mouth at bedtime.   Yes [provider]  folic acid (FOLVITE) 1 MG tablet Take 1 mg by mouth daily. Pt takes 1mg  everyday except on wed (methotrexate day)   Yes [provider]  furosemide (LASIX) 40 MG tablet Take 1 tablet (40 mg total) by mouth daily. 01/01/18  Yes Max Sane, MD  pantoprazole (PROTONIX) 40 MG tablet Take 40 mg by mouth daily as needed.    Yes [provider]  predniSONE (DELTASONE) 10 MG tablet Take 10 mg by mouth daily with breakfast.   Yes [provider]  traMADol (ULTRAM) 50 MG tablet Take by mouth every 6 (six) hours as needed.   Yes [provider]  losartan (COZAAR) 25 MG tablet Take 1 tablet (25 mg total) by mouth daily. Patient not taking: Reported on 02/03/2018 01/15/18  04/15/18  End, Harrell Gave, MD    Review of Systems  Constitutional: Positive for fatigue (mild). Negative for appetite change.  HENT: Negative for congestion, postnasal drip and sore throat.   Eyes: Negative.   Respiratory: Negative for cough, chest tightness and shortness of breath.   Cardiovascular: Negative for chest pain, palpitations and leg swelling.  Gastrointestinal: Negative for abdominal distention and abdominal pain.  Endocrine: Negative.   Genitourinary: Negative.   Musculoskeletal: Negative for back pain and neck pain.  Skin: Negative.   Allergic/Immunologic: Negative.   Neurological: Negative for dizziness and light-headedness.  Hematological: Negative for adenopathy. Does not bruise/bleed easily.  Psychiatric/Behavioral: Positive for sleep disturbance. Negative for dysphoric mood. The patient is not nervous/anxious.    Vitals:   02/03/18 0900  BP: (!) 154/113  Pulse: 78  Resp: 18  SpO2: 100%  Weight: 172 lb 6 oz (78.2 kg)  Height: 5\' 10"  (1.778 m)   Wt Readings from Last 3 Encounters:  02/03/18 172 lb 6 oz (78.2 kg)  01/14/18 175 lb 4 oz (79.5 kg)  01/09/18 180 lb (81.6 kg)   Lab Results  Component Value Date   CREATININE 1.29 (H) 01/14/2018   CREATININE 1.12 12/31/2017   CREATININE 1.21 12/30/2017    Physical Exam  Constitutional: He is oriented to person, place, and time. He appears well-developed and well-nourished.  HENT:  Head: Normocephalic and atraumatic.  Neck: Normal range of motion. Neck supple. No JVD present.  Cardiovascular: Normal rate and regular rhythm.  Pulmonary/Chest: Effort normal. He has no wheezes. He has no rales.  Abdominal: He exhibits no distension. There is no tenderness.  Musculoskeletal: He exhibits no edema or tenderness.  Neurological: He is alert and oriented to person, place, and time.  Skin: Skin is warm and dry.  Psychiatric: He has a normal mood and affect. His behavior is normal. Thought content normal.  Nursing  note and vitals reviewed.  Assessment & Plan:  1: Chronic heart failure with reduced ejection fraction- - NYHA class II - euvolemic today - weighing daily and says that his weight has been stable. Instructed to call for an overnight weight gain of >2 pounds or a weekly weight gain of >5 pounds - weight down 3 pounds since 01/14/18 - not adding salt to his food. Reminded to keep daily sodium intake to 2000mg  daily - could consider titrating up his carvedilol in the future - saw cardiology (End) 01/14/18 & returns April 2019 - will not resume losartan and will begin entresto 24/26mg  twice daily. 30 day voucher given to patient.  - will also decrease his furosemide to 20mg  daily as previous BP readings have been on the low side - will get a BMP at his next office visit  2: HTN- - BP elevated but he hasn't taken his medications yet today and has also been out of his losartan for  a few days - changing medications per above - BMP from 01/14/18 reviewed and showed sodium 137, potassium 4.8 and GFR >60 - saw PCP Sabra Heck) 01/13/18 & returns on 02/06/18  Reviewed medication bottles.  Return in 1 month or sooner for any questions/problems before then.

## 2018-02-03 ENCOUNTER — Ambulatory Visit: Payer: Medicare PPO | Attending: Family | Admitting: Family

## 2018-02-03 ENCOUNTER — Encounter: Payer: Self-pay | Admitting: Family

## 2018-02-03 VITALS — BP 154/113 | HR 78 | Resp 18 | Ht 70.0 in | Wt 172.4 lb

## 2018-02-03 DIAGNOSIS — Z7982 Long term (current) use of aspirin: Secondary | ICD-10-CM | POA: Insufficient documentation

## 2018-02-03 DIAGNOSIS — Z883 Allergy status to other anti-infective agents status: Secondary | ICD-10-CM | POA: Diagnosis not present

## 2018-02-03 DIAGNOSIS — I2510 Atherosclerotic heart disease of native coronary artery without angina pectoris: Secondary | ICD-10-CM | POA: Insufficient documentation

## 2018-02-03 DIAGNOSIS — K219 Gastro-esophageal reflux disease without esophagitis: Secondary | ICD-10-CM | POA: Insufficient documentation

## 2018-02-03 DIAGNOSIS — Z79899 Other long term (current) drug therapy: Secondary | ICD-10-CM | POA: Diagnosis not present

## 2018-02-03 DIAGNOSIS — I5022 Chronic systolic (congestive) heart failure: Secondary | ICD-10-CM | POA: Diagnosis not present

## 2018-02-03 DIAGNOSIS — I110 Hypertensive heart disease with heart failure: Secondary | ICD-10-CM | POA: Diagnosis not present

## 2018-02-03 DIAGNOSIS — I10 Essential (primary) hypertension: Secondary | ICD-10-CM

## 2018-02-03 DIAGNOSIS — Z87891 Personal history of nicotine dependence: Secondary | ICD-10-CM | POA: Insufficient documentation

## 2018-02-03 MED ORDER — CARVEDILOL 3.125 MG PO TABS: 3.1250 mg | ORAL_TABLET | Freq: Two times a day (BID) | ORAL | 5 refills | Status: DC

## 2018-02-03 MED ORDER — SACUBITRIL-VALSARTAN 24-26 MG PO TABS: 1.0000 | ORAL_TABLET | Freq: Two times a day (BID) | ORAL | 3 refills | Status: DC

## 2018-02-03 MED ORDER — FUROSEMIDE 20 MG PO TABS: 20.0000 mg | ORAL_TABLET | Freq: Every day | ORAL | 5 refills | Status: DC

## 2018-02-03 NOTE — Patient Instructions (Addendum)
Continue weighing daily and call for an overnight weight gain of > 2 pounds or a weekly weight gain of >5 pounds.  Begin entresto 24/26mg  twice daily in place of your losartan.  Furosemide (lasix) is decreased to 20mg  daily. Finish your current bottle by breaking tablets in half and then begin the new prescription which will be a 20mg  tablet so you will take 1 tablet daily at that time.

## 2018-02-04 ENCOUNTER — Encounter: Payer: Self-pay | Admitting: Family

## 2018-02-10 ENCOUNTER — Encounter: Admit: 2018-02-10 | Discharge: 2018-02-11 | Payer: BLUE CROSS/BLUE SHIELD

## 2018-02-10 DIAGNOSIS — L309 Dermatitis, unspecified: Principal | ICD-10-CM

## 2018-02-10 MED ORDER — TRIAMCINOLONE ACETONIDE 0.1 % TOPICAL OINTMENT
6 refills | 0 days | Status: CP
Start: 2018-02-10 — End: 2018-10-14

## 2018-03-02 NOTE — Progress Notes (Signed)
Patient ID: Earl Hays, male    DOB: 1941-10-05, 77 y.o.   MRN: [id number]  HPI  Earl Hays is a 77 y/o male with a history of GERD, HTN, eczema, previous tobacco use and chronic heart failure.   Echo report done on 12/30/17 reviewed and showed an EF of 30-35% along with mild/mod Earl and moderately increased PA pressure of 45 mm Hg. Cardiac catheterization done 12/31/17 showed mild-moderate non-obstructive CAD along with elevated left and right pressures.  Admitted 12/29/17 due to acute heart failure. Initially needed IV diuretics and then transitioned to oral diuretics. Cardiology consult done. Elevated troponin thought to be due to demand ischemia. Discharged after 2 days.  He presents today for a follow-up visit with a chief complaint of minimal fatigue upon moderate exertion. He says this has been present for months but is getting much better every day. He has associated cough with trying to clear mucous out of his throat but he says this is also getting better. He denies any difficulty sleeping, abdominal distention, palpitations, edema, chest pain, dizziness, shortness of breath or weight gain.   Past Medical History:  Diagnosis Date  . CHF (congestive heart failure) ([place])   . GERD (gastroesophageal reflux disease)   . Hypertension   . Varices, esophageal ([place])    Past Surgical History:  Procedure Laterality Date  . CATARACT EXTRACTION W/PHACO Left 01/21/2017   Procedure: CATARACT EXTRACTION PHACO AND INTRAOCULAR LENS PLACEMENT (IOC);  Surgeon: Birder Robson, MD;  Location: [facility] ORS;  Service: Ophthalmology;  Laterality: Left;  [place] 00:48 AP% 21.6 CDE 10.39 Fluid pack lot # [id number]  . CATARACT EXTRACTION W/PHACO Right 02/11/2017   Procedure: CATARACT EXTRACTION PHACO AND INTRAOCULAR LENS PLACEMENT (IOC);  Surgeon: Birder Robson, MD;  Location: [facility] ORS;  Service: Ophthalmology;  Laterality: Right;  [place] 00:42 AP% 22.4 CDE 9.47 Fluid pack lot # [id number]  . COLONOSCOPY    . JOINT  REPLACEMENT     TKR  . REPLACEMENT TOTAL KNEE BILATERAL Bilateral   . RIGHT/LEFT HEART CATH AND CORONARY ANGIOGRAPHY N/A 12/31/2017   Procedure: RIGHT/LEFT HEART CATH AND CORONARY ANGIOGRAPHY;  Surgeon: Nelva Bush, MD;  Location: [place] [facility];  Service: Cardiovascular;  Laterality: N/A;   Family History  Problem Relation Age of Onset  . Brain cancer Mother    Social History   Tobacco Use  . Smoking status: Former Smoker    Types: Cigarettes    Last attempt to quit: 2004    Years since quitting: 15.2  . Smokeless tobacco: Never Used  . Tobacco comment: Quit in 2004  Substance Use Topics  . Alcohol use: Yes    Alcohol/week: 0.6 oz    Types: 1 Cans of beer per week    Frequency: Never    Comment: Every now and then   Allergies  Allergen Reactions  . Lamisil [Terbinafine] Itching and Rash   Prior to Admission medications   Medication Sig Start Date End Date Taking? Authorizing Provider  aspirin EC 81 MG tablet Take 81 mg by mouth daily.   Yes [provider]  carvedilol (COREG) 3.125 MG tablet Take 1 tablet (3.125 mg total) by mouth 2 (two) times daily with a meal. 02/03/18  Yes Darylene Price A, FNP  folic acid (FOLVITE) 1 MG tablet Take 1 mg by mouth daily. Pt takes 1mg  everyday except on wed (methotrexate day)   Yes [provider]  furosemide (LASIX) 20 MG tablet Take 1 tablet (20 mg total) by mouth  daily. 02/03/18  Yes Darylene Price A, FNP  methotrexate 2.5 MG tablet Take 2.5 mg by mouth as directed. Take 6 tablets by mouth weekly on the same day   Yes [provider]  pantoprazole (PROTONIX) 40 MG tablet Take 40 mg by mouth daily as needed.    Yes [provider]  predniSONE (DELTASONE) 10 MG tablet Take 5 mg by mouth daily with breakfast.    Yes [provider]  sacubitril-valsartan (ENTRESTO) 24-26 MG Take 1 tablet by mouth 2 (two) times daily. 02/03/18  Yes Alisa Graff, FNP    Review of Systems  Constitutional:  Positive for fatigue (mild). Negative for appetite change.  HENT: Negative for congestion, postnasal drip and sore throat.   Eyes: Negative.   Respiratory: Positive for cough (minimal ). Negative for chest tightness and shortness of breath.   Cardiovascular: Negative for chest pain, palpitations and leg swelling.  Gastrointestinal: Negative for abdominal distention and abdominal pain.  Endocrine: Negative.   Genitourinary: Negative.   Musculoskeletal: Negative for back pain and neck pain.  Skin: Negative.   Allergic/Immunologic: Negative.   Neurological: Negative for dizziness and light-headedness.  Hematological: Negative for adenopathy. Does not bruise/bleed easily.  Psychiatric/Behavioral: Negative for dysphoric mood and sleep disturbance. The patient is not nervous/anxious.    Vitals:   03/04/18 0902  BP: (!) 113/44  Pulse: 79  Resp: 18  SpO2: 100%  Weight: 181 lb (82.1 kg)  Height: 5\' 10"  (1.778 m)   Wt Readings from Last 3 Encounters:  03/04/18 181 lb (82.1 kg)  02/03/18 172 lb 6 oz (78.2 kg)  01/14/18 175 lb 4 oz (79.5 kg)   Lab Results  Component Value Date   CREATININE 1.29 (H) 01/14/2018   CREATININE 1.12 12/31/2017   CREATININE 1.21 12/30/2017    Physical Exam  Constitutional: He is oriented to person, place, and time. He appears well-developed and well-nourished.  HENT:  Head: Normocephalic and atraumatic.  Neck: Normal range of motion. Neck supple. No JVD present.  Cardiovascular: Normal rate and regular rhythm.  Pulmonary/Chest: Effort normal. He has no wheezes. He has no rales.  Abdominal: He exhibits no distension. There is no tenderness.  Musculoskeletal: He exhibits no edema or tenderness.  Neurological: He is alert and oriented to person, place, and time.  Skin: Skin is warm and dry.  Psychiatric: He has a normal mood and affect. His behavior is normal. Thought content normal.  Nursing note and vitals reviewed.  Assessment & Plan:  1: Chronic  heart failure with reduced ejection fraction- - NYHA class II - euvolemic today - weighing daily and says that his weight has been stable. Says that it fluctuates 1-2 pounds. Reminded to call for an overnight weight gain of >2 pounds or a weekly weight gain of >5 pounds - weight up 9 pounds since he was last here - not adding salt to his food. Reminded to keep daily sodium intake to 2000mg  daily. Using Mrs. Dash & says that his appetite is "great" - discussed titrating up either his entresto or his carvedilol at his next office visit - saw cardiology (End) 01/14/18 & returns April 2019 - tolerating entresto and low dose furosemide without known side effects - will get a BMP today since entresto was started  2: HTN- - BP much improved - BMP from 01/14/18 reviewed and showed sodium 137, potassium 4.8 and GFR >60 - saw PCP Sabra Heck) 02/06/18 & he returns next week  Reviewed medication bottles.  Return in  1 month or sooner for any questions/problems before then.

## 2018-03-04 ENCOUNTER — Encounter: Payer: Self-pay | Admitting: Family

## 2018-03-04 ENCOUNTER — Ambulatory Visit: Payer: Medicare PPO | Attending: Family | Admitting: Family

## 2018-03-04 VITALS — BP 113/44 | HR 79 | Resp 18 | Ht 70.0 in | Wt 181.0 lb

## 2018-03-04 DIAGNOSIS — Z7982 Long term (current) use of aspirin: Secondary | ICD-10-CM | POA: Diagnosis not present

## 2018-03-04 DIAGNOSIS — Z9889 Other specified postprocedural states: Secondary | ICD-10-CM | POA: Insufficient documentation

## 2018-03-04 DIAGNOSIS — Z87891 Personal history of nicotine dependence: Secondary | ICD-10-CM | POA: Diagnosis not present

## 2018-03-04 DIAGNOSIS — I5022 Chronic systolic (congestive) heart failure: Secondary | ICD-10-CM

## 2018-03-04 DIAGNOSIS — Z79899 Other long term (current) drug therapy: Secondary | ICD-10-CM | POA: Insufficient documentation

## 2018-03-04 DIAGNOSIS — K219 Gastro-esophageal reflux disease without esophagitis: Secondary | ICD-10-CM | POA: Insufficient documentation

## 2018-03-04 DIAGNOSIS — I509 Heart failure, unspecified: Secondary | ICD-10-CM | POA: Diagnosis not present

## 2018-03-04 DIAGNOSIS — I10 Essential (primary) hypertension: Secondary | ICD-10-CM

## 2018-03-04 DIAGNOSIS — I110 Hypertensive heart disease with heart failure: Secondary | ICD-10-CM | POA: Insufficient documentation

## 2018-03-04 LAB — BASIC METABOLIC PANEL
Anion gap: 7 (ref 5–15)
BUN: 16 mg/dL (ref 6–20)
CO2: 28 mmol/L (ref 22–32)
Calcium: 8.9 mg/dL (ref 8.9–10.3)
Chloride: 104 mmol/L (ref 101–111)
Creatinine, Ser: 1.13 mg/dL (ref 0.61–1.24)
GFR calc Af Amer: >60 mL/min (ref >60)
GFR calc non Af Amer: >60 mL/min (ref >60)
Glucose, Bld: 86 mg/dL (ref 65–99)
Potassium: 4 mmol/L (ref 3.5–5.1)
Sodium: 139 mmol/L (ref 135–145)

## 2018-03-04 NOTE — Patient Instructions (Signed)
Continue weighing daily and call for an overnight weight gain of > 2 pounds or a weekly weight gain of >5 pounds. 

## 2018-03-11 ENCOUNTER — Telehealth: Payer: Self-pay | Admitting: Family

## 2018-03-11 MED ORDER — LOSARTAN POTASSIUM 25 MG PO TABS: 25.0000 mg | ORAL_TABLET | Freq: Every day | ORAL | 3 refills | Status: DC

## 2018-03-11 NOTE — Telephone Encounter (Signed)
Returned phone call regarding message patient had left. Patient says that he's out of entresto and that his pharmacy told him that even with his insurance, it would be >$200/month and he's unable to afford that. [employer] is currently closed so he can't get assistance through them.  Will put patient on losartan 25mg  daily with possible titration in the future. Instructed him that the losartan will be taken once daily. Patient verbalized understanding.

## 2018-03-30 NOTE — Progress Notes (Signed)
Patient ID: Earl Hays, male    DOB: April 21, 1941, 77 y.o.   MRN: [id number]  HPI  Earl Hays is a 77 y/o male with a history of GERD, HTN, eczema, previous tobacco use and chronic heart failure.   Echo report done on 12/30/17 reviewed and showed an EF of 30-35% along with mild/mod Earl and moderately increased PA pressure of 45 mm Hg. Cardiac catheterization done 12/31/17 showed mild-moderate non-obstructive CAD along with elevated left and right pressures.  Admitted 12/29/17 due to acute heart failure. Initially needed IV diuretics and then transitioned to oral diuretics. Cardiology consult done. Elevated troponin thought to be due to demand ischemia. Discharged after 2 days.  He presents today for a follow-up visit with a chief complaint of minimal fatigue upon exertion. He says this has been present for several years with varying levels of severity. He has associated cough along with this. He denies any difficulty sleeping, abdominal distention, palpitations, edema, chest pain, shortness of breath, dizziness or weight gain. He admits that he often forgets his 2nd dose of carvedilol.  Past Medical History:  Diagnosis Date  . CHF (congestive heart failure) ([place])   . GERD (gastroesophageal reflux disease)   . Hypertension   . Varices, esophageal ([place])    Past Surgical History:  Procedure Laterality Date  . CATARACT EXTRACTION W/PHACO Left 01/21/2017   Procedure: CATARACT EXTRACTION PHACO AND INTRAOCULAR LENS PLACEMENT (IOC);  Surgeon: Birder Robson, MD;  Location: [facility];  Service: Ophthalmology;  Laterality: Left;  [place] 00:48 AP% 21.6 CDE 10.39 Fluid pack lot # [id number]  . CATARACT EXTRACTION W/PHACO Right 02/11/2017   Procedure: CATARACT EXTRACTION PHACO AND INTRAOCULAR LENS PLACEMENT (IOC);  Surgeon: Birder Robson, MD;  Location: [facility];  Service: Ophthalmology;  Laterality: Right;  [place] 00:42 AP% 22.4 CDE 9.47 Fluid pack lot # [id number]  . COLONOSCOPY    . JOINT REPLACEMENT     TKR   . REPLACEMENT TOTAL KNEE BILATERAL Bilateral   . RIGHT/LEFT HEART CATH AND CORONARY ANGIOGRAPHY N/A 12/31/2017   Procedure: RIGHT/LEFT HEART CATH AND CORONARY ANGIOGRAPHY;  Surgeon: Nelva Bush, MD;  Location: [place] [facility];  Service: Cardiovascular;  Laterality: N/A;   Family History  Problem Relation Age of Onset  . Brain cancer Mother    Social History   Tobacco Use  . Smoking status: Former Smoker    Types: Cigarettes    Last attempt to quit: 2004    Years since quitting: 15.2  . Smokeless tobacco: Never Used  . Tobacco comment: Quit in 2004  Substance Use Topics  . Alcohol use: Yes    Alcohol/week: 0.6 oz    Types: 1 Cans of beer per week    Frequency: Never    Comment: Every now and then   Allergies  Allergen Reactions  . Lamisil [Terbinafine] Itching and Rash   Prior to Admission medications   Medication Sig Start Date End Date Taking? Authorizing Provider  aspirin EC 81 MG tablet Take 81 mg by mouth daily.   Yes [provider]  carvedilol (COREG) 3.125 MG tablet Take 1 tablet (3.125 mg total) by mouth 2 (two) times daily with a meal. 02/03/18  Yes Kenn Rekowski A, FNP  Cyanocobalamin 3000 MCG SUBL Place 3,000 mcg under the tongue.   Yes [provider]  furosemide (LASIX) 20 MG tablet Take 1 tablet (20 mg total) by mouth daily. 02/03/18  Yes Darylene Price A, FNP  losartan (COZAAR) 25 MG tablet Take 1 tablet (  25 mg total) by mouth daily. 03/11/18 06/09/18 Yes Birgitta Uhlir A, FNP  pantoprazole (PROTONIX) 40 MG tablet Take 40 mg by mouth daily as needed.    Yes [provider]  triamcinolone ointment (KENALOG) 0.1 % Apply 1 application topically 2 (two) times daily as needed (eczema).   Yes [provider]  folic acid (FOLVITE) 1 MG tablet Take 1 mg by mouth daily. Pt takes 1mg  everyday except on wed (methotrexate day)    [provider]  methotrexate 2.5 MG tablet Take 2.5 mg by mouth as directed. Take 6 tablets by  mouth weekly on the same day    [provider]  predniSONE (DELTASONE) 10 MG tablet Take 5 mg by mouth daily with breakfast.     [provider]   Review of Systems  Constitutional: Positive for fatigue (mild). Negative for appetite change.  HENT: Positive for postnasal drip. Negative for congestion and sore throat.   Eyes: Negative.   Respiratory: Positive for cough (minimal ). Negative for chest tightness and shortness of breath.   Cardiovascular: Negative for chest pain, palpitations and leg swelling.  Gastrointestinal: Negative for abdominal distention and abdominal pain.  Endocrine: Negative.   Genitourinary: Negative.   Musculoskeletal: Negative for back pain and neck pain.  Skin: Negative.   Allergic/Immunologic: Negative.   Neurological: Negative for dizziness and light-headedness.  Hematological: Negative for adenopathy. Does not bruise/bleed easily.  Psychiatric/Behavioral: Negative for dysphoric mood and sleep disturbance. The patient is not nervous/anxious.    Vitals:   03/31/18 0922  BP: 135/73  Pulse: 87  Resp: 18  SpO2: 100%  Weight: 176 lb (79.8 kg)  Height: 5\' 10"  (1.778 m)   Wt Readings from Last 3 Encounters:  03/31/18 176 lb (79.8 kg)  03/04/18 181 lb (82.1 kg)  02/03/18 172 lb 6 oz (78.2 kg)   Lab Results  Component Value Date   CREATININE 1.13 03/04/2018   CREATININE 1.29 (H) 01/14/2018   CREATININE 1.12 12/31/2017    Physical Exam  Constitutional: He is oriented to person, place, and time. He appears well-developed and well-nourished.  HENT:  Head: Normocephalic and atraumatic.  Neck: Normal range of motion. Neck supple. No JVD present.  Cardiovascular: Normal rate and regular rhythm.  Pulmonary/Chest: Effort normal. He has no wheezes. He has no rales.  Abdominal: He exhibits no distension. There is no tenderness.  Musculoskeletal: He exhibits no edema or tenderness.  Neurological: He is alert and oriented to person, place,  and time.  Skin: Skin is warm and dry.  Psychiatric: He has a normal mood and affect. His behavior is normal. Thought content normal.  Nursing note and vitals reviewed.  Assessment & Plan:  1: Chronic heart failure with reduced ejection fraction- - NYHA class II - euvolemic today - weighing daily. Reminded to call for an overnight weight gain of >2 pounds or a weekly weight gain of >5 pounds - weight down 5 pounds since 03/04/18 - not adding salt to his food. Reminded to keep daily sodium intake to 2000mg  daily. Using Mrs. Dash & No salt says that his appetite is "great" - was unable to afford entresto so placed back on losartan. [employer] now has assistance for patient's with insurance and he is agreeable to going back on entresto if he's able. Will keep losartan dosage the same for now until he gets approved for entresto - often forgets to take his PM dose of carvedilol; discussed ways of remembering to take it. Could titrate dose  up in the future but he needs to get consistent with taking it twice daily.  - saw cardiology (End) 01/14/18 & returns 04/15/18 - PharmD reconciled medications with patient  2: HTN- - BP looks good today - BMP from 03/04/18 reviewed and showed sodium 139, potassium 4.0 and GFR >60 - saw PCP Sabra Heck) 03/18/18  Reviewed medication bottles.  Return in 2 months or sooner for any questions/problems before then. Should patient begin entresto before then, will check labs. If he doesn't qualify for entresto assistance, will work on titrating up losartan dose

## 2018-03-31 ENCOUNTER — Encounter: Payer: Self-pay | Admitting: Family

## 2018-03-31 ENCOUNTER — Ambulatory Visit: Payer: Medicare PPO | Attending: Family | Admitting: Family

## 2018-03-31 VITALS — BP 135/73 | HR 87 | Resp 18 | Ht 70.0 in | Wt 176.0 lb

## 2018-03-31 DIAGNOSIS — Z9889 Other specified postprocedural states: Secondary | ICD-10-CM | POA: Insufficient documentation

## 2018-03-31 DIAGNOSIS — I110 Hypertensive heart disease with heart failure: Secondary | ICD-10-CM | POA: Insufficient documentation

## 2018-03-31 DIAGNOSIS — L309 Dermatitis, unspecified: Secondary | ICD-10-CM | POA: Diagnosis not present

## 2018-03-31 DIAGNOSIS — K219 Gastro-esophageal reflux disease without esophagitis: Secondary | ICD-10-CM | POA: Diagnosis not present

## 2018-03-31 DIAGNOSIS — Z808 Family history of malignant neoplasm of other organs or systems: Secondary | ICD-10-CM | POA: Insufficient documentation

## 2018-03-31 DIAGNOSIS — Z883 Allergy status to other anti-infective agents status: Secondary | ICD-10-CM | POA: Diagnosis not present

## 2018-03-31 DIAGNOSIS — R5383 Other fatigue: Secondary | ICD-10-CM | POA: Insufficient documentation

## 2018-03-31 DIAGNOSIS — Z7952 Long term (current) use of systemic steroids: Secondary | ICD-10-CM | POA: Diagnosis not present

## 2018-03-31 DIAGNOSIS — Z79899 Other long term (current) drug therapy: Secondary | ICD-10-CM | POA: Diagnosis not present

## 2018-03-31 DIAGNOSIS — R05 Cough: Secondary | ICD-10-CM | POA: Diagnosis not present

## 2018-03-31 DIAGNOSIS — Z09 Encounter for follow-up examination after completed treatment for conditions other than malignant neoplasm: Secondary | ICD-10-CM | POA: Insufficient documentation

## 2018-03-31 DIAGNOSIS — I10 Essential (primary) hypertension: Secondary | ICD-10-CM

## 2018-03-31 DIAGNOSIS — I5022 Chronic systolic (congestive) heart failure: Secondary | ICD-10-CM | POA: Diagnosis not present

## 2018-03-31 DIAGNOSIS — Z87891 Personal history of nicotine dependence: Secondary | ICD-10-CM | POA: Diagnosis not present

## 2018-03-31 DIAGNOSIS — Z7982 Long term (current) use of aspirin: Secondary | ICD-10-CM | POA: Insufficient documentation

## 2018-03-31 DIAGNOSIS — Z96653 Presence of artificial knee joint, bilateral: Secondary | ICD-10-CM | POA: Diagnosis not present

## 2018-03-31 NOTE — Patient Instructions (Signed)
Continue weighing daily and call for an overnight weight gain of > 2 pounds or a weekly weight gain of >5 pounds. 

## 2018-04-15 ENCOUNTER — Encounter: Admit: 2018-04-15 | Discharge: 2018-04-16 | Payer: BLUE CROSS/BLUE SHIELD

## 2018-04-15 ENCOUNTER — Encounter: Payer: Self-pay | Admitting: Internal Medicine

## 2018-04-15 ENCOUNTER — Ambulatory Visit: Payer: Medicare PPO | Admitting: Internal Medicine

## 2018-04-15 VITALS — BP 110/70 | HR 72 | Ht 70.0 in | Wt 176.5 lb

## 2018-04-15 DIAGNOSIS — L309 Dermatitis, unspecified: Principal | ICD-10-CM

## 2018-04-15 DIAGNOSIS — I5042 Chronic combined systolic (congestive) and diastolic (congestive) heart failure: Secondary | ICD-10-CM

## 2018-04-15 DIAGNOSIS — I2510 Atherosclerotic heart disease of native coronary artery without angina pectoris: Secondary | ICD-10-CM | POA: Diagnosis not present

## 2018-04-15 MED ORDER — CARVEDILOL 6.25 MG PO TABS: 6.2500 mg | ORAL_TABLET | Freq: Two times a day (BID) | ORAL | 2 refills | Status: DC

## 2018-04-15 MED ORDER — CLOBETASOL 0.05 % TOPICAL OINTMENT
5 refills | 0 days | Status: CP
Start: 2018-04-15 — End: 2018-05-05

## 2018-04-15 MED ORDER — BETAMETHASONE DIPROPIONATE 0.05 % TOPICAL OINTMENT
6 refills | 0 days | Status: CP
Start: 2018-04-15 — End: ?

## 2018-04-15 NOTE — Patient Instructions (Signed)
Medication Instructions:  Your physician has recommended you make the following change in your medication:  INCREASE Carvedilol to 6.25 mg by mouth two times a day.   Labwork: none  Testing/Procedures: Your physician has requested that you have an LIMITED echocardiogram. Echocardiography is a painless test that uses sound waves to create images of your heart. It provides your doctor with information about the size and shape of your heart and how well your heart's chambers and valves are working. This procedure takes approximately one hour. There are no restrictions for this procedure.  You may receive an ultrasound enhancing agent during your echo. If you do, then you should expect to receive an IV in order to be given this.   Follow-Up: Your physician recommends that you schedule a follow-up appointment in: 3 MONTHS WITH DR END.   If you need a refill on your cardiac medications before your next appointment, please call your pharmacy.   Echocardiogram An echocardiogram, or echocardiography, uses sound waves (ultrasound) to produce an image of your heart. The echocardiogram is simple, painless, obtained within a short period of time, and offers valuable information to your health care provider. The images from an echocardiogram can provide information such as:  Evidence of coronary artery disease (CAD).  Heart size.  Heart muscle function.  Heart valve function.  Aneurysm detection.  Evidence of a past heart attack.  Fluid buildup around the heart.  Heart muscle thickening.  Assess heart valve function.  Tell a health care provider about:  Any allergies you have.  All medicines you are taking, including vitamins, herbs, eye drops, creams, and over-the-counter medicines.  Any problems you or family members have had with anesthetic medicines.  Any blood disorders you have.  Any surgeries you have had.  Any medical conditions you have.  Whether you are pregnant or  may be pregnant. What happens before the procedure? No special preparation is needed. Eat and drink normally. What happens during the procedure?  In order to produce an image of your heart, gel will be applied to your chest and a wand-like tool (transducer) will be moved over your chest. The gel will help transmit the sound waves from the transducer. The sound waves will harmlessly bounce off your heart to allow the heart images to be captured in real-time motion. These images will then be recorded.  You may need an IV to receive a medicine that improves the quality of the pictures. What happens after the procedure? You may return to your normal schedule including diet, activities, and medicines, unless your health care provider tells you otherwise. This information is not intended to replace advice given to you by your health care provider. Make sure you discuss any questions you have with your health care provider. Document Released: 12/06/2000 Document Revised: 07/27/2016 Document Reviewed: 08/16/2013 Elsevier Interactive Patient Education  2017 [employer].

## 2018-04-15 NOTE — Progress Notes (Addendum)
Follow-up Outpatient Visit Date: 04/15/2018  Primary Care Provider: Rusty Aus, MD [place] [id number]  Chief Complaint: Follow-up heart failure  HPI:  Earl Hays is a 77 y.o. year-old male with history of chronic systolic and diastolic heart failure, esophageal varices with upper GI bleed, and eczema on methotrexate, who presents for follow-up of heart failure.  I last saw him in January, at which time he was doing well with the exception of a chronic cough (which was one of his presenting symptoms at the time of heart failure diagnosis in 12/2017).  He was seen by Darylene Price, NP, in the heart failure clinic on 03/31/18, at which time he reported often missing second dose of carvedilol.  He had been tried on [place] as well but was back on losartan due to affordability issues.  Today, Earl Hays reports that he is feeling well.  He has not had any further coughing.  Interestingly, methotrexate was discontinued after our last visit; question if this may have been contributing to his chronic cough.  Unfortunately, Earl Hays eczema seems to have worsened.  He is following with his dermatologist in the near future.  He denies chest pain, shortness of breath, palpitations, lightheadedness, orthopnea, and edema.  He plays golf on a regular basis without any limitations.  His weight has been stable at home.  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 LAD with 40% proximal and 20% mid disease. Small RI with 60% proximal stenosis. LCS with 20% mid/distal stenosis. Normal RCA. LVEDP  25 mmHg. RA 10, RV 40/14, PA 40/21 (27), PCWP 22. Ao sat 94%, PA sat 74%, Fick CO/CI 6.5/3.7.  CV Surgery:  None  EP Procedures and Devices:  None  Non-Invasive Evaluation(s):  TTE (12/30/17): Upper normal LV size with moderate LVH. LVEF 30-35% with diffuse hypokinesis. Grade 2 diastolic dysfunction with elevated filling pressure. Aortic sclerosis and degenerative mitral valve disease. Mild to moderate MR. Mild to moderate LAE. Normal RV size and function. Moderate PH. Small pericardial effusion.  Recent CV Pertinent Labs: Lab Results  Component Value Date   INR 0.93 12/31/2017   INR 1.1 01/24/2014   K 4.0 03/04/2018   K 3.8 01/24/2014   BUN 16 03/04/2018   BUN 21 (H) 01/24/2014   CREATININE 1.13 03/04/2018   CREATININE 1.13 01/24/2014    Past medical and surgical history were reviewed and updated in EPIC.  Current Meds  Medication Sig  . aspirin EC 81 MG tablet Take 81 mg by mouth daily.  . Cyanocobalamin 3000 MCG SUBL Place 3,000 mcg under the tongue.  . furosemide (LASIX) 20 MG tablet Take 1 tablet (20 mg total) by mouth daily.  Marland Kitchen losartan (COZAAR) 25 MG tablet Take 1 tablet (25 mg total) by mouth daily.  . pantoprazole (PROTONIX) 40 MG tablet Take 40 mg by mouth daily as needed.   . triamcinolone ointment (KENALOG) 0.1 % Apply 1 application topically 2 (two) times daily as needed (eczema).  . [DISCONTINUED] carvedilol (COREG) 3.125 MG tablet Take 1 tablet (3.125 mg total) by mouth 2 (two) times daily with a meal.    Allergies: Lamisil [terbinafine]  Social History   Tobacco Use  . Smoking status: Former Smoker    Types: Cigarettes  Last attempt to quit: 2004    Years since quitting: 15.3  . Smokeless tobacco: Never Used  . Tobacco comment: Quit in 2004  Substance Use Topics  . Alcohol use: Yes    Alcohol/week: 0.6 oz    Types: 1 Cans of beer per week    Frequency: Never    Comment: Every now and then  . Drug use: No    Family History  Problem  Relation Age of Onset  . Brain cancer Mother     Review of Systems: A 12-system review of systems was performed and was negative except as noted in the HPI.  --------------------------------------------------------------------------------------------------  Physical Exam: BP 110/70 (BP Location: Left Arm, Patient Position: Sitting, Cuff Size: Normal)   Pulse 72   Ht 5\' 10"  (1.778 m)   Wt 176 lb 8 oz (80.1 kg)   BMI 25.33 kg/m   General: NAD. HEENT: No conjunctival pallor or scleral icterus. Moist mucous membranes.  OP clear. Neck: Supple without lymphadenopathy, thyromegaly, JVD, or HJR. Lungs: Normal work of breathing. Clear to auscultation bilaterally without wheezes or crackles. Heart: Regular rate and rhythm without murmurs, rubs, or gallops. Non-displaced PMI. Abd: Bowel sounds present. Soft, NT/ND without hepatosplenomegaly Ext: No lower extremity edema. Radial, PT, and DP pulses are 2+ bilaterally. Skin: Warm and dry.  Areas of scaling and excoriation noted on both hands.  EKG:  NSR with nonspecific T wave abnormality.  Lab Results  Component Value Date   WBC 7.4 12/31/2017   HGB 13.9 12/31/2017   HCT 41.3 12/31/2017   MCV 91.8 12/31/2017   PLT 136 (L) 12/31/2017    Lab Results  Component Value Date   NA 139 03/04/2018   K 4.0 03/04/2018   CL 104 03/04/2018   CO2 28 03/04/2018   BUN 16 03/04/2018   CREATININE 1.13 03/04/2018   GLUCOSE 86 03/04/2018   ALT 23 01/24/2014    No results found for: CHOL, HDL, LDLCALC, LDLDIRECT, TRIG, CHOLHDL  --------------------------------------------------------------------------------------------------  ASSESSMENT AND PLAN: Chronic systolic and diastolic heart failure Earl Hays is doing well with NYHA class II symptoms.  He appears euvolemic on exam.  He was recently advised to start [facility], but has been unable to afford this.  He is awaiting approval for medication assistance from [employer].  In the meantime, I think it  is reasonable to continue his current losartan 25 mg daily.  I will increase carvedilol to 6.25 mg twice daily.  We will plan to repeat an echocardiogram in the next few weeks to reassess his LV function.  Nonobstructive coronary artery disease No symptoms to suggest worsening coronary insufficiency.  Patient not currently on a statin.  Lipid panel previously recommended but yet to be drawn.  Of note, LDL was 154 and 05/2017.  We will need to readdress this at follow-up.  Follow-up: Return to clinic in 3 months.  Nelva Bush, MD 05/04/2018 2:17 PM

## 2018-04-29 ENCOUNTER — Other Ambulatory Visit: Payer: Self-pay

## 2018-04-29 ENCOUNTER — Ambulatory Visit (INDEPENDENT_AMBULATORY_CARE_PROVIDER_SITE_OTHER): Payer: Medicare PPO

## 2018-04-29 DIAGNOSIS — I5042 Chronic combined systolic (congestive) and diastolic (congestive) heart failure: Secondary | ICD-10-CM

## 2018-04-29 DIAGNOSIS — I2510 Atherosclerotic heart disease of native coronary artery without angina pectoris: Secondary | ICD-10-CM

## 2018-04-29 MED ORDER — PERFLUTREN LIPID MICROSPHERE
1.0000 mL | INTRAVENOUS | Status: AC | PRN
  Administered 2018-04-29: 2 mL via INTRAVENOUS

## 2018-05-05 ENCOUNTER — Encounter: Admit: 2018-05-05 | Discharge: 2018-05-06 | Payer: BLUE CROSS/BLUE SHIELD

## 2018-05-05 DIAGNOSIS — L309 Dermatitis, unspecified: Principal | ICD-10-CM

## 2018-05-05 MED ORDER — CLOBETASOL 0.05 % TOPICAL OINTMENT
10 refills | 0 days | Status: CP
Start: 2018-05-05 — End: 2018-08-19

## 2018-05-05 MED ORDER — METHOTREXATE SODIUM 2.5 MG TABLET
ORAL_TABLET | 3 refills | 0 days | Status: CP
Start: 2018-05-05 — End: 2018-07-01

## 2018-05-05 MED ORDER — FOLIC ACID 1 MG TABLET
ORAL_TABLET | 3 refills | 0 days | Status: CP
Start: 2018-05-05 — End: 2018-07-01

## 2018-05-05 MED ORDER — PREDNISONE 10 MG TABLET
ORAL_TABLET | 0 refills | 0 days | Status: CP
Start: 2018-05-05 — End: 2018-06-17

## 2018-06-02 ENCOUNTER — Ambulatory Visit: Payer: Medicare PPO | Admitting: Family

## 2018-06-03 ENCOUNTER — Ambulatory Visit: Payer: Medicare PPO | Attending: Family | Admitting: Family

## 2018-06-03 ENCOUNTER — Encounter: Payer: Self-pay | Admitting: Family

## 2018-06-03 VITALS — BP 112/74 | HR 70 | Resp 18 | Ht 70.0 in | Wt 177.0 lb

## 2018-06-03 DIAGNOSIS — Z87891 Personal history of nicotine dependence: Secondary | ICD-10-CM | POA: Insufficient documentation

## 2018-06-03 DIAGNOSIS — Z7952 Long term (current) use of systemic steroids: Secondary | ICD-10-CM | POA: Insufficient documentation

## 2018-06-03 DIAGNOSIS — Z96653 Presence of artificial knee joint, bilateral: Secondary | ICD-10-CM | POA: Diagnosis not present

## 2018-06-03 DIAGNOSIS — I8500 Esophageal varices without bleeding: Secondary | ICD-10-CM | POA: Diagnosis not present

## 2018-06-03 DIAGNOSIS — Z7982 Long term (current) use of aspirin: Secondary | ICD-10-CM | POA: Insufficient documentation

## 2018-06-03 DIAGNOSIS — I509 Heart failure, unspecified: Secondary | ICD-10-CM | POA: Insufficient documentation

## 2018-06-03 DIAGNOSIS — I110 Hypertensive heart disease with heart failure: Secondary | ICD-10-CM | POA: Insufficient documentation

## 2018-06-03 DIAGNOSIS — L309 Dermatitis, unspecified: Secondary | ICD-10-CM

## 2018-06-03 DIAGNOSIS — I10 Essential (primary) hypertension: Secondary | ICD-10-CM

## 2018-06-03 DIAGNOSIS — Z79899 Other long term (current) drug therapy: Secondary | ICD-10-CM | POA: Insufficient documentation

## 2018-06-03 DIAGNOSIS — K219 Gastro-esophageal reflux disease without esophagitis: Secondary | ICD-10-CM | POA: Insufficient documentation

## 2018-06-03 DIAGNOSIS — I5022 Chronic systolic (congestive) heart failure: Secondary | ICD-10-CM

## 2018-06-03 NOTE — Patient Instructions (Addendum)
Continue weighing daily and call for an overnight weight gain of > 2 pounds or a weekly weight gain of >5 pounds.  Call pharmacy about refill for methotrexate.  Get 90 days of bank statements and bring to Heart Failure clinic TODAY or Friday.

## 2018-06-03 NOTE — Progress Notes (Signed)
Patient ID: Earl Hays, male    DOB: [birth year], 77 y.o.   MRN: [id number]  HPI  Earl Hays is a 77 y/o male with a history of GERD, HTN, eczema, previous tobacco use and chronic heart failure.   Echo report done on 12/30/17 reviewed and showed an EF of 30-35% along with mild/mod Earl and moderately increased PA pressure of 45 mm Hg. Cardiac catheterization done 12/31/17 showed mild-moderate non-obstructive CAD along with elevated left and right pressures.  Admitted 12/29/17 due to acute heart failure. Initially needed IV diuretics and then transitioned to oral diuretics. Cardiology consult done. Elevated troponin thought to be due to demand ischemia. Discharged after 2 days.  He presents today for a follow-up visit with a chief complaint of minimal fatigue upon exertion. He says this has been present for several years with varying levels of severity. He previously c/o cough but has not had any issues. I suspect that cough was related to methotrexate use for his eczema. He denies any difficulty sleeping, abdominal distention, palpitations, edema, chest pain, shortness of breath, dizziness or weight gain.   Past Medical History:  Diagnosis Date  . CHF (congestive heart failure) ([place])   . GERD (gastroesophageal reflux disease)   . Hypertension   . Varices, esophageal ([place])    Past Surgical History:  Procedure Laterality Date  . CARDIAC CATHETERIZATION    . CATARACT EXTRACTION W/PHACO Left 01/21/2017   Procedure: CATARACT EXTRACTION PHACO AND INTRAOCULAR LENS PLACEMENT (IOC);  Surgeon: Birder Robson, MD;  Location: [facility] ORS;  Service: Ophthalmology;  Laterality: Left;  [place] 00:48 AP% 21.6 CDE 10.39 Fluid pack lot # [id number]  . CATARACT EXTRACTION W/PHACO Right 02/11/2017   Procedure: CATARACT EXTRACTION PHACO AND INTRAOCULAR LENS PLACEMENT (IOC);  Surgeon: Birder Robson, MD;  Location: [facility] ORS;  Service: Ophthalmology;  Laterality: Right;  [place] 00:42 AP% 22.4 CDE 9.47 Fluid pack lot #  [id number]  . COLONOSCOPY    . JOINT REPLACEMENT     TKR  . REPLACEMENT TOTAL KNEE BILATERAL Bilateral   . RIGHT/LEFT HEART CATH AND CORONARY ANGIOGRAPHY N/A 12/31/2017   Procedure: RIGHT/LEFT HEART CATH AND CORONARY ANGIOGRAPHY;  Surgeon: Nelva Bush, MD;  Location: [facility];  Service: Cardiovascular;  Laterality: N/A;   Family History  Problem Relation Age of Onset  . Brain cancer Mother    Social History   Tobacco Use  . Smoking status: Former Smoker    Types: Cigarettes    Last attempt to quit: 2004    Years since quitting: 15.4  . Smokeless tobacco: Never Used  . Tobacco comment: Quit in 2004  Substance Use Topics  . Alcohol use: Yes    Alcohol/week: 0.6 oz    Types: 1 Cans of beer per week    Frequency: Never    Comment: Every now and then   Allergies  Allergen Reactions  . Lamisil [Terbinafine] Itching and Rash   Prior to Admission medications   Medication Sig Start Date End Date Taking? Authorizing Provider  aspirin EC 81 MG tablet Take 81 mg by mouth daily.   Yes [provider]  carvedilol (COREG) 3.125 MG tablet Take 1 tablet (3.125 mg total) by mouth 2 (two) times daily with a meal. 02/03/18  Yes Hackney, Tina A, FNP  Cyanocobalamin 3000 MCG SUBL Place 3,000 mcg under the tongue.   Yes [provider]  furosemide (LASIX) 20 MG tablet Take 1 tablet (20 mg total) by mouth daily. 02/03/18  Yes Darylene Price  A, FNP  losartan (COZAAR) 25 MG tablet Take 1 tablet (25 mg total) by mouth daily. 03/11/18 06/09/18 Yes Hackney, Tina A, FNP  pantoprazole (PROTONIX) 40 MG tablet Take 40 mg by mouth daily as needed.    Yes [provider]  triamcinolone ointment (KENALOG) 0.1 % Apply 1 application topically 2 (two) times daily as needed (eczema).   Yes [provider]  folic acid (FOLVITE) 1 MG tablet Take 1 mg by mouth daily. Pt takes 1mg  everyday except on wed (methotrexate day)    [provider]  methotrexate 2.5 MG  tablet Take 2.5 mg by mouth as directed. Take 6 tablets by mouth weekly on the same day    [provider]  predniSONE (DELTASONE) 10 MG tablet Take 5 mg by mouth daily with breakfast.     [provider]   Review of Systems  Constitutional: Positive for fatigue (mild). Negative for appetite change.  HENT: Negative for congestion, postnasal drip and sore throat.   Eyes: Negative.   Respiratory: Negative for cough, chest tightness and shortness of breath.   Cardiovascular: Negative for chest pain, palpitations and leg swelling.  Gastrointestinal: Negative for abdominal distention and abdominal pain.  Endocrine: Negative.   Genitourinary: Negative.   Musculoskeletal: Negative for back pain and neck pain.  Skin: Negative.   Allergic/Immunologic: Negative.   Neurological: Negative for dizziness and light-headedness.  Hematological: Negative for adenopathy. Does not bruise/bleed easily.  Psychiatric/Behavioral: Negative for dysphoric mood and sleep disturbance. The patient is not nervous/anxious.    Vitals:   06/03/18 0923  BP: 112/74  Pulse: 70  Resp: 18  SpO2: 100%  Weight: 177 lb (80.3 kg)  Height: 5\' 10"  (1.778 m)   Wt Readings from Last 3 Encounters:  06/03/18 177 lb (80.3 kg)  04/15/18 176 lb 8 oz (80.1 kg)  03/31/18 176 lb (79.8 kg)   Lab Results  Component Value Date   CREATININE 1.13 03/04/2018   CREATININE 1.29 (H) 01/14/2018   CREATININE 1.12 12/31/2017    Physical Exam  Constitutional: He is oriented to person, place, and time. He appears well-developed and well-nourished.  HENT:  Head: Normocephalic and atraumatic.  Neck: Normal range of motion. Neck supple. No JVD present.  Cardiovascular: Normal rate and regular rhythm.  Pulmonary/Chest: Effort normal. He has no wheezes. He has no rales.  Abdominal: He exhibits no distension. There is no tenderness.  Musculoskeletal: He exhibits no edema or tenderness.  Neurological: He is alert and  oriented to person, place, and time.  Skin: Skin is warm and dry.  Psychiatric: He has a normal mood and affect. His behavior is normal. Thought content normal.  Nursing note and vitals reviewed.  Assessment & Plan:  1: Chronic heart failure with reduced ejection fraction- - NYHA class II - euvolemic today - weighing daily. Reminded to call for an overnight weight gain of >2 pounds or a weekly weight gain of >5 pounds - weight up 1 pound since 04/15/18 - not adding salt to his food. Reminded to keep daily sodium intake to 2000mg  daily. Using Mrs. Dash & No salt says that his appetite is "great" - was unable to afford entresto so placed back on losartan. [employer] now has assistance for patient's with insurance and he is agreeable to going back on entresto if he's able. Will keep losartan dosage the same for now until he gets approved for entresto. CMA called [employer] who stated that Earl Hays had not provided them with 90 days  of bank statements. Patient was informed of this and instructed to bring [place] bank statements either today or Friday since the clinic is closed tomorrow. Patient expressed understanding.  - from previous visit patient stated that he often forgets to take his PM dose of carvedilol; asked patient regarding this and he states that he has been remembering to take it.   - saw cardiology (End) 04/15/18 who increased his carvedilol to 6.25mg  BID - PharmD reconciled medications with patient - will continue to follow up with patient/[employer] about starting Entresto - once patient starts Delene Loll will get labs   2: HTN- - BP looks good today - BMP from 03/04/18 reviewed and showed sodium 139, potassium 4.0 and GFR >60 - saw PCP Sabra Heck) 03/18/18  3. Eczema- - followed by [facility] dermatologist Dr. Charlyne Quale - noticed that patient stated he was no longer taking methotrexate. Read in dermatologist note that they had prescribed at last visit on 05/05/18 with 3 refills and wanted him to  continue taking. Patient was confused and had thought that it had 0 refills. I explained that that was the prednisone and that the patient needed to continue taking the methotrexate until he was instructed by his dermatologist to stop. Patient expressed understanding and stated he would get this refilled. Spent ~ 10 minutes talking with patient about his eczema regimen to make sure he understood why he needs to be on the methotrexate/folic acid and when to take these  Reviewed medication bottles.  Return on 09/03/18 or sooner for any questions/problems before then. Should patient begin entresto before then, will check labs. If he doesn't qualify for entresto assistance, will work on titrating up losartan dose  Lendon Ka, PharmD Pharmacy Resident

## 2018-06-17 MED ORDER — PREDNISONE 10 MG TABLET
ORAL_TABLET | 0 refills | 0 days | Status: CP
Start: 2018-06-17 — End: 2019-07-27

## 2018-07-01 MED ORDER — FOLIC ACID 1 MG TABLET
ORAL_TABLET | 3 refills | 0 days | Status: CP
Start: 2018-07-01 — End: 2018-08-19

## 2018-07-01 MED ORDER — METHOTREXATE SODIUM 2.5 MG TABLET
ORAL_TABLET | 1 refills | 0 days | Status: CP
Start: 2018-07-01 — End: 2018-07-15

## 2018-07-08 ENCOUNTER — Ambulatory Visit (INDEPENDENT_AMBULATORY_CARE_PROVIDER_SITE_OTHER): Payer: Medicare PPO | Admitting: Internal Medicine

## 2018-07-08 ENCOUNTER — Encounter: Payer: Self-pay | Admitting: Internal Medicine

## 2018-07-08 VITALS — BP 132/68 | HR 63 | Ht 70.0 in | Wt 178.5 lb

## 2018-07-08 DIAGNOSIS — L309 Dermatitis, unspecified: Secondary | ICD-10-CM

## 2018-07-08 DIAGNOSIS — I428 Other cardiomyopathies: Secondary | ICD-10-CM | POA: Diagnosis not present

## 2018-07-08 DIAGNOSIS — I5022 Chronic systolic (congestive) heart failure: Secondary | ICD-10-CM | POA: Diagnosis not present

## 2018-07-08 DIAGNOSIS — I2510 Atherosclerotic heart disease of native coronary artery without angina pectoris: Secondary | ICD-10-CM

## 2018-07-08 MED ORDER — LOSARTAN POTASSIUM 50 MG PO TABS: 50.0000 mg | ORAL_TABLET | Freq: Every day | ORAL | 3 refills | Status: DC

## 2018-07-08 NOTE — Patient Instructions (Addendum)
Medication Instructions:  Your physician recommends that you continue on your current medications as directed. Please refer to the Current Medication list given to you today. 1- INCREASE Losartan to 50 mg by mouth once a day.   Labwork: Your physician recommends that you return for lab work in: [place] ([facility]).   Your physician recommends that you return for lab work in: [place] ([facility]). - ON 07/22/18 at the [facility].  -Please go to the [facility]. You will check in at the front desk to the right as you walk into the atrium. Valet Parking is offered if needed.    Testing/Procedures: none  Follow-Up: Your physician recommends that you schedule a follow-up appointment in: 3 MONTHS WITH DR END.   If you need a refill on your cardiac medications before your next appointment, please call your pharmacy.

## 2018-07-08 NOTE — Progress Notes (Signed)
Follow-up Outpatient Visit Date: 07/08/2018  Primary Care Provider: Rusty Aus, MD [place] [id number]  Chief Complaint: Follow-up chronic systolic heart failure  HPI:  Earl Hays is a 77 y.o. year-old male with history of chronic systolic and diastolic heart failure, esophageal varices with upper GI bleed, and eczema previously on methotrexate, who presents for follow-up of heart failure.  I last saw him in April, at which time he was doing well other than worsening of his eczema after discontinuation of methotrexate.  He was unable to afford Entresto at that time and was still awaiting approval of medication assistance through [employer].  We therefore continued with losartan 25 mg daily and increased carvedilol to 6.25 mg twice daily.  Repeat echocardiogram in May showed persistently low LVEF of 30 to 35%.  He was seen by Darylene Price, NP, last month, which time he was doing well with stable NYHA class II symptoms.  No medication changes were made.  Today, Earl Hays reports that he has been feeling well.  He is surprised that his weight is up 10 pounds from his home scale, though he notes at home his weights have been fairly stable.  He denies chest pain, shortness of breath, palpitations, lightheadedness, orthopnea, PND, and edema.  He notes that he was recently placed on a prednisone taper by his dermatologist due to eczema.  He stopped this medication within the last week.  He is exercising regularly, doing both cardiovascular and resistance training without any limitations.  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 LAD with 40% proximal and 20% mid disease. Small RI with 60% proximal stenosis.  LCx with 20% mid/distal stenosis. Normal RCA. LVEDP 25 mmHg. RA 10, RV 40/14, PA 40/21 (27), PCWP 22. Ao sat 94%, PA sat 74%, Fick CO/CI 6.5/3.7.  CV Surgery:  None  EP Procedures and Devices:  None  Non-Invasive Evaluation(s):  Limited TTE (04/29/2018): Mildly dilated LV.  LVEF 30 to 35% with global hypokinesis.  Normal RV size and function.  No pericardial effusion.  TTE (12/30/17): Upper normal LV size with moderate LVH. LVEF 30 to 35% with diffuse hypokinesis. Grade 2 diastolic dysfunction with elevated filling pressure. Aortic sclerosis and degenerative mitral valve disease. Mild to moderate MR. Mild to moderate LAE. Normal RV size and function. Moderate PH. Small pericardial effusion.  Recent CV Pertinent Labs: Lab Results  Component Value Date   INR 0.93 12/31/2017   INR 1.1 01/24/2014   K 4.0 03/04/2018   K 3.8 01/24/2014   BUN 16 03/04/2018   BUN 21 (H) 01/24/2014   CREATININE 1.13 03/04/2018   CREATININE 1.13 01/24/2014    Past medical and surgical history were reviewed and updated in EPIC.  Current Meds  Medication Sig  . aspirin EC 81 MG tablet Take 81 mg by mouth daily.  . carvedilol (COREG) 6.25 MG tablet Take 1 tablet (6.25 mg total) by mouth 2 (two) times daily.  . Cyanocobalamin 3000 MCG SUBL Place 3,000 mcg under the tongue.  . folic acid (FOLVITE) 1 MG tablet Take 1 mg by mouth daily.  . furosemide (LASIX) 20 MG tablet Take 1 tablet (20 mg total) by mouth daily.  Marland Kitchen losartan (COZAAR) 25 MG tablet Take 1 tablet (  25 mg total) by mouth daily.  . pantoprazole (PROTONIX) 40 MG tablet Take 40 mg by mouth daily as needed.   . [DISCONTINUED] methotrexate (RHEUMATREX) 2.5 MG tablet Take 2.5 mg by mouth once a week. Take 6 tablets by mouth once weekly.  Protect from light.    Allergies: Lamisil [terbinafine]  Social History   Tobacco  Use  . Smoking status: Former Smoker    Types: Cigarettes    Last attempt to quit: 2004    Years since quitting: 15.5  . Smokeless tobacco: Never Used  . Tobacco comment: Quit in 2004  Substance Use Topics  . Alcohol use: Yes    Alcohol/week: 0.6 oz    Types: 1 Cans of beer per week    Frequency: Never    Comment: Every now and then  . Drug use: No    Family History  Problem Relation Age of Onset  . Brain cancer Mother     Review of Systems: A 12-system review of systems was performed and was negative except as noted in the HPI.  --------------------------------------------------------------------------------------------------  Physical Exam: BP 132/68 (BP Location: Left Arm, Patient Position: Sitting, Cuff Size: Normal)   Pulse 63   Ht 5\' 10"  (1.778 m)   Wt 178 lb 8 oz (81 kg)   BMI 25.61 kg/m   General: NAD. HEENT: No conjunctival pallor or scleral icterus. Moist mucous membranes.  OP clear. Neck: Supple without lymphadenopathy, thyromegaly, JVD, or HJR. Lungs: Normal work of breathing. Clear to auscultation bilaterally without wheezes or crackles. Heart: Regular rate and rhythm without murmurs, rubs, or gallops. Non-displaced PMI. Abd: Bowel sounds present. Soft, NT/ND without hepatosplenomegaly Ext: No lower extremity edema. Radial, PT, and DP pulses are 2+ bilaterally. Skin: Warm and dry with multiple areas of scaly skin on upper and lower extremities.  EKG:  NSR with sinus arrhythmia.  Non-specific T-wave changes.  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  We will continue current dose of carvedilol, as borderline low resting heart rate precludes up titration at this time.  I will increase losartan to 50 mg daily.  We will check a BMP today and again in about 2 weeks.  Once losartan has been maximized, we will also consider adding an aldosterone antagonist.  Unfortunately, Earl Hays is still awaiting on final approval from [employer] for [employer].  We discussed referral for ICD for primary prevention, but given his NYHA class I symptoms and nonischemic cardiomyopathy, we have agreed to defer this.  Non-obstructive coronary artery disease No symptoms to suggest worsening coronary any insufficiency.  Recent LDL through PCP was 157.  We will plan to start atorvastatin 20 mg daily with repeat lipid panel and ALT in about 3 months.  Continue low-dose aspirin as well.  Eczema Somewhat improved at this time following recent course of steroids.  I  would advocate for avoidance of systemic corticosteroids, if possible, given her propensity for fluid retention and worsening of heart failure.  Follow-up: Return to clinic in 3 months.  Nelva Bush, MD 07/08/2018 11:22 AM

## 2018-07-09 ENCOUNTER — Encounter: Payer: Self-pay | Admitting: Internal Medicine

## 2018-07-09 ENCOUNTER — Telehealth: Payer: Self-pay | Admitting: *Deleted

## 2018-07-09 DIAGNOSIS — E785 Hyperlipidemia, unspecified: Secondary | ICD-10-CM

## 2018-07-09 DIAGNOSIS — I428 Other cardiomyopathies: Secondary | ICD-10-CM | POA: Insufficient documentation

## 2018-07-09 DIAGNOSIS — Z79899 Other long term (current) drug therapy: Secondary | ICD-10-CM

## 2018-07-09 LAB — BASIC METABOLIC PANEL
BUN/Creatinine Ratio: 14 (ref 10–24)
BUN: 15 mg/dL (ref 8–27)
CO2: 28 mmol/L (ref 20–29)
Calcium: 9.1 mg/dL (ref 8.6–10.2)
Chloride: 103 mmol/L (ref 96–106)
Creatinine, Ser: 1.05 mg/dL (ref 0.76–1.27)
GFR calc Af Amer: 80 mL/min/{1.73_m2} (ref >59)
GFR calc non Af Amer: 70 mL/min/{1.73_m2} (ref >59)
Glucose: 90 mg/dL (ref 65–99)
Potassium: 3.8 mmol/L (ref 3.5–5.2)
Sodium: 144 mmol/L (ref 134–144)

## 2018-07-09 MED ORDER — ATORVASTATIN CALCIUM 20 MG PO TABS: 20.0000 mg | ORAL_TABLET | Freq: Every day | ORAL | 3 refills | Status: DC

## 2018-07-09 NOTE — Telephone Encounter (Signed)
-----   Message from Nelva Bush, MD sent at 07/09/2018  7:17 AM EDT ----- Claris Gladden,  Can you let Mr. Degidio know that I just noticed that his LDL was still elevated on recent labs by his PCP?  In light of mild to moderate CAD on catheterization last year, I recommend that we start atorvastatin 20 mg daily and repeat a lipid panel and ALT in about 3 months.  Please let me know if any questions come up.  Thanks.  Gerald Stabs

## 2018-07-09 NOTE — Telephone Encounter (Signed)
Patient verbalized understanding of Dr Darnelle Bos recommendations including to start atorvastatin 20 mg and [facility]. Rx sent to pharmacy and lab orders for lipid and ALT entered.

## 2018-07-09 NOTE — Telephone Encounter (Signed)
Patient returning call.

## 2018-07-15 ENCOUNTER — Encounter: Admit: 2018-07-15 | Discharge: 2018-07-16 | Payer: BLUE CROSS/BLUE SHIELD

## 2018-07-15 DIAGNOSIS — L309 Dermatitis, unspecified: Principal | ICD-10-CM

## 2018-07-15 MED ORDER — METHOTREXATE SODIUM 2.5 MG TABLET
ORAL_TABLET | 1 refills | 0 days | Status: CP
Start: 2018-07-15 — End: 2018-08-19

## 2018-07-22 ENCOUNTER — Other Ambulatory Visit
Admission: RE | Admit: 2018-07-22 | Discharge: 2018-07-22 | Disposition: A | Discharge status: Home / Self Care | Payer: Medicare PPO | Source: Ambulatory Visit | Attending: Internal Medicine | Admitting: Internal Medicine

## 2018-07-22 DIAGNOSIS — Z79899 Other long term (current) drug therapy: Secondary | ICD-10-CM

## 2018-07-22 DIAGNOSIS — I428 Other cardiomyopathies: Secondary | ICD-10-CM

## 2018-07-22 DIAGNOSIS — I5022 Chronic systolic (congestive) heart failure: Secondary | ICD-10-CM | POA: Insufficient documentation

## 2018-07-22 DIAGNOSIS — E785 Hyperlipidemia, unspecified: Secondary | ICD-10-CM | POA: Insufficient documentation

## 2018-07-22 DIAGNOSIS — I2510 Atherosclerotic heart disease of native coronary artery without angina pectoris: Secondary | ICD-10-CM | POA: Insufficient documentation

## 2018-07-22 LAB — LIPID PANEL
Cholesterol: 219 mg/dL — ABNORMAL HIGH (ref 0–200)
HDL: 43 mg/dL (ref >40)
LDL Cholesterol: 143 mg/dL — ABNORMAL HIGH (ref 0–99)
Total CHOL/HDL Ratio: 5.1 RATIO
Triglycerides: 165 mg/dL — ABNORMAL HIGH (ref <150)
VLDL: 33 mg/dL (ref 0–40)

## 2018-07-22 LAB — ALT: ALT: 17 U/L (ref 0–44)

## 2018-07-22 LAB — BASIC METABOLIC PANEL
Anion gap: 8 (ref 5–15)
BUN: 22 mg/dL (ref 8–23)
CO2: 30 mmol/L (ref 22–32)
Calcium: 9.1 mg/dL (ref 8.9–10.3)
Chloride: 104 mmol/L (ref 98–111)
Creatinine, Ser: 1.15 mg/dL (ref 0.61–1.24)
GFR calc Af Amer: >60 mL/min (ref >60)
GFR calc non Af Amer: >60 mL/min (ref >60)
Glucose, Bld: 83 mg/dL (ref 70–99)
Potassium: 4 mmol/L (ref 3.5–5.1)
Sodium: 142 mmol/L (ref 135–145)

## 2018-07-23 ENCOUNTER — Other Ambulatory Visit: Payer: Self-pay | Admitting: *Deleted

## 2018-07-23 DIAGNOSIS — E785 Hyperlipidemia, unspecified: Secondary | ICD-10-CM

## 2018-07-23 DIAGNOSIS — Z79899 Other long term (current) drug therapy: Secondary | ICD-10-CM

## 2018-07-30 MED ORDER — TRIAMCINOLONE ACETONIDE 0.1 % TOPICAL OINTMENT
6 refills | 0 days | Status: CP
Start: 2018-07-30 — End: 2018-08-19

## 2018-08-19 ENCOUNTER — Encounter: Admit: 2018-08-19 | Discharge: 2018-08-20 | Payer: BLUE CROSS/BLUE SHIELD

## 2018-08-19 DIAGNOSIS — L309 Dermatitis, unspecified: Secondary | ICD-10-CM

## 2018-08-19 DIAGNOSIS — Z79899 Other long term (current) drug therapy: Secondary | ICD-10-CM

## 2018-08-19 DIAGNOSIS — R21 Rash and other nonspecific skin eruption: Principal | ICD-10-CM

## 2018-08-19 MED ORDER — TRIAMCINOLONE ACETONIDE 0.1 % TOPICAL OINTMENT
6 refills | 0 days | Status: CP
Start: 2018-08-19 — End: 2019-06-18

## 2018-08-19 MED ORDER — DUPILUMAB 300 MG/2 ML SUBCUTANEOUS SYRINGE
PRN refills | 0 days | Status: CP
Start: 2018-08-19 — End: 2018-10-14

## 2018-08-19 MED ORDER — METHOTREXATE SODIUM 2.5 MG TABLET
ORAL_TABLET | 3 refills | 0 days | Status: CP
Start: 2018-08-19 — End: 2018-10-14

## 2018-08-19 MED ORDER — FOLIC ACID 1 MG TABLET
ORAL_TABLET | 3 refills | 0 days | Status: CP
Start: 2018-08-19 — End: 2019-08-19

## 2018-08-19 NOTE — Unmapped (Signed)
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  Dispense: 32 tablet; Refill: 3  - Start folic acid (FOLVITE) 1 MG tablet; Take 1 pill daily on NON-methotrexate days  Dispense: 100 tablet; Refill: 3  - Start dupilumab (DUPIXENT) 300 mg/2 mL Syrg injection; INJECT THE CONTENTS OF 2 SYRINGES (600MG ) UNDER THE SKIN ONCE TO LOAD, THEN INJECT 1 SYRINGE EVERY OTHER WEEK FOR MAINTENANCE  Dispense: 4 mL; Refill: PRN  - He has reportedly had patch testing at an outside dermatologist [facility]) which was reportedly only positive for gold. Could consider repeating this in the future  -He is not a diabetic but does have heart failure therefore we will try to avoid prednisone as much as possible    High risk medication use (methotrexate):   - CBC and differential  - AST  - ALT  - BUN  - Creatinine    Return to clinic: 3 months.     CHIEF COMPLAINT:  Follow up eczema    HPI:   This is a pleasant 77 y.o.-year-old who last saw me on 07/15/2018.  He continues to follow-up for hand eczema.  He has only increased his methotrexate to 20 mg just recently because his insurance would not cover for the increase until just recently.  His rash if anything is slightly worse since last time but again he has not had an opportunity to increase the methotrexate.  He is currently tolerating it well without significant side effects.    PAST MEDICAL HISTORY:  Eczema  Acute on chronic systolic CHF, cardiomyopathy  Reactive airway disease  HTN  ??  No personal history of skin cancer  No diabetes    MEDICATIONS:   Reviewed in Epic.     ALLERGIES:   Terbinafine hcl    SOCIAL HISTORY:  [place] locally  Enjoys golf    REVIEW OF SYSTEMS:  Baseline state of health. No recent illnesses. No other skin complaints.    PHYSICAL EXAMINATION:  Examination in the presence of male chaperone:  General: Well-developed, well-nourished. No acute distress. Neuro: Alert and oriented, answers questions appropriately.  Skin: Examination of the scalp, face, neck, chest, abdomen, back, bilateral upper extremities, bilateral lower extremities, palms, nails was performed and notable for the following:  Eczematous patches on bilateral wrists, hands, ankles new eczematous patches on thighs

## 2018-08-19 NOTE — Unmapped (Signed)
Patient Education        dupilumab  Pronunciation:  doo PIL [place] mab  Brand:  Dupixent  What is the most important information I should know about dupilumab?  Follow all directions on your medicine label and package. Tell each of your healthcare providers about all your medical conditions, allergies, and all medicines you use.  What is dupilumab?  Dupilumab is used in adults  to treat moderate-to-severe eczema that cannot be controlled with topical medicines (for use on the skin).  Dupilumab is also used together with other medications to prevent severe asthma attacks in adults and children who are at least 16 years old.  Dupilumab may also be used for purposes not listed in this medication guide.  What should I discuss with my healthcare provider before using dupilumab?  You should not use dupilumab if you are allergic to it.  Tell your doctor if you have ever had:  ?? eye problems;  ?? a parasite infection (such as roundworms or tapeworms); or  ?? if you are scheduled to receive any vaccine.  Tell your doctor if you are pregnant or breast-feeding.  How should I use dupilumab?  Follow all directions on your prescription label and read all medication guides or instruction sheets. Use the medicine exactly as directed.  Dupilumab is injected under the skin, usually once every other week. Your first dose may be given in 2 injections.  A healthcare provider may teach you how to properly use the medication by yourself. Read and carefully follow any Instructions for Use provided with your medicine. Ask your doctor or pharmacist if you don't understand all instructions.  Do not shake the prefilled syringe. Prepare your injection only when you are ready to give it. Do not use if the medicine looks cloudy, has changed colors, or has particles in it. Call your pharmacist for new medicine.  Store prefilled syringes in their original carton in the refrigerator. Protect from light and do not freeze.  Take a syringe out of the refrigerator and let it reach room temperature for 45 minutes before injecting your dose. Leave the needle cap on the syringe until you are ready to inject your dose.  You may store a prefilled syringe at cool room temperature for up to 14 days. Throw the medicine away if not used within 14 days. Do not put it back into the refrigerator.  Each prefilled syringe is for one use only. Throw it away after one use, even if there is still medicine left inside.  Use a needle and syringe only once and then place them in a puncture-proof sharps container. Follow state or local laws about how to dispose of this container. Keep it out of the reach of children and pets.  Dupilumab is not a rescue medicine for asthma attacks. Use only fast-acting inhalation medicine for an attack. Seek medical attention if your breathing problems get worse quickly, or if you think your asthma medications are not working as well.  If you also use asthma medication or any type of steroid, do not stop using it without your doctor's advice.  What happens if I miss a dose?  Use the medicine as soon as you can, but skip the missed dose if you are more than 7 days late for the injection. Do not use two doses at one time.  What happens if I overdose?  Seek emergency medical attention or call the Poison Help line at [phone].  What should I avoid while using dupilumab?  Do not receive a live vaccine while using dupilumab. The vaccine may not work as well during this time, and may not fully protect you from disease. Live vaccines include measles, mumps, rubella (MMR), polio, rotavirus, typhoid, yellow fever, varicella (chickenpox), zoster (shingles), and nasal flu (influenza) vaccine.  What are the possible side effects of dupilumab?  Get emergency medical help if you have signs of an allergic reaction: hives, itching; fever, swollen glands, joint pain; difficult breathing; swelling of your face, lips, tongue, or throat.  Call your doctor at once if you have:  ?? new or worsening eye pain or discomfort;  ?? vision changes;  ?? watery eyes (your eyes may be more sensitive to light);  ?? feeling like something is in your eye; or  ?? blood vessel inflammation --fever, chest pain, trouble breathing, skin rash, numbness or prickly feeling in your arms or legs.  Common side effects may include:  ?? pain, swelling, burning, or irritation where an injection was given;  ?? eye redness or itching;  ?? puffy eyelids; or  ?? cold sores or fever blisters on your lips or in your mouth.  This is not a complete list of side effects and others may occur. Call your doctor for medical advice about side effects. You may report side effects to FDA at 1-800-FDA-1088.  What other drugs will affect dupilumab?  Other drugs may affect dupilumab, including prescription and over-the-counter medicines, vitamins, and herbal products. Tell your doctor about all your current medicines and any medicine you start or stop using.  Where can I get more information?  Your pharmacist can provide more information about dupilumab.  Remember, keep this and all other medicines out of the reach of children, never share your medicines with others, and use this medication only for the indication prescribed.  Every effort has been made to ensure that the information provided by [employer] ('[employer]') is accurate, up-to-date, and complete, but no guarantee is made to that effect. Drug information contained herein may be time sensitive. [employer] information has been compiled for use by healthcare practitioners and consumers in the [place] and therefore [employer] does not warrant that uses outside of the [place] are appropriate, unless specifically indicated otherwise. [employer]'s drug information does not endorse drugs, diagnose patients or recommend therapy. [employer]'s drug information is an [employer], corporate to assist licensed healthcare practitioners in caring for their patients and/or to serve consumers viewing this service as a supplement to, and not a substitute for, the expertise, skill, knowledge and judgment of healthcare practitioners. The absence of a warning for a given drug or drug combination in no way should be construed to indicate that the drug or drug combination is safe, effective or appropriate for any given patient. [employer] does not assume any responsibility for any aspect of healthcare administered with the aid of information [employer] provides. The information contained herein is not intended to cover all possible uses, directions, precautions, warnings, drug interactions, allergic reactions, or adverse effects. If you have questions about the drugs you are taking, check with your doctor, nurse or pharmacist.  Copyright [phone] [employer] Version: 2.01. Revision date: 11/06/2017.  Care instructions adapted under license by [facility]. If you have questions about a medical condition or this instruction, always ask your healthcare professional. [employer] disclaims any warranty or liability for your use of this information.

## 2018-08-20 LAB — CBC W/ DIFFERENTIAL
BANDED NEUTROPHILS ABSOLUTE COUNT: 0 10*3/uL (ref 0.0–0.1)
BASOPHILS ABSOLUTE COUNT: 0 10*3/uL (ref 0.0–0.2)
BASOPHILS RELATIVE PERCENT: 1 %
EOSINOPHILS ABSOLUTE COUNT: 0.2 10*3/uL (ref 0.0–0.4)
EOSINOPHILS RELATIVE PERCENT: 5 %
HEMATOCRIT: 39.8 % (ref 37.5–51.0)
HEMOGLOBIN: 13.5 g/dL (ref 13.0–17.7)
IMMATURE GRANULOCYTES: 0 %
LYMPHOCYTES RELATIVE PERCENT: 13 %
MEAN CORPUSCULAR HEMOGLOBIN CONC: 33.9 g/dL (ref 31.5–35.7)
MEAN CORPUSCULAR VOLUME: 91 fL (ref 79–97)
MONOCYTES ABSOLUTE COUNT: 0.5 10*3/uL (ref 0.1–0.9)
MONOCYTES RELATIVE PERCENT: 10 %
NEUTROPHILS ABSOLUTE COUNT: 3.1 10*3/uL (ref 1.4–7.0)
NEUTROPHILS RELATIVE PERCENT: 71 %
PLATELET COUNT: 136 10*3/uL — ABNORMAL LOW (ref 150–450)
RED BLOOD CELL COUNT: 4.38 x10E6/uL (ref 4.14–5.80)
RED CELL DISTRIBUTION WIDTH: 13.6 % (ref 12.3–15.4)
WHITE BLOOD CELL COUNT: 4.4 10*3/uL (ref 3.4–10.8)

## 2018-08-20 LAB — AST (SGOT): Lab: 20

## 2018-08-20 LAB — NEUTROPHILS ABSOLUTE COUNT: Lab: 3.1

## 2018-08-20 LAB — ALT (SGPT): Lab: 14

## 2018-08-20 LAB — BLOOD UREA NITROGEN: Lab: 13

## 2018-08-20 LAB — CREATININE
CREATININE: 1.16 mg/dL (ref 0.76–1.27)
Lab: 1.16

## 2018-09-01 NOTE — Progress Notes (Signed)
Patient ID: EZEKIEL MENZER, male    DOB: 1941-04-27, 77 y.o.   MRN: [id number]  HPI  Mr Zimmerle is a 77 y/o male with a history of GERD, HTN, eczema, previous tobacco use and chronic heart failure.   Echo report done 04/29/18 reviewed and showed an EF of 30-35%. Echo report done on 12/30/17 reviewed and showed an EF of 30-35% along with mild/mod MR and moderately increased PA pressure of 45 mm Hg.   Cardiac catheterization done 12/31/17 showed mild-moderate non-obstructive CAD along with elevated left and right pressures.  He has not been admitted or been in the ED in the last 6 months.   He presents today for a follow-up visit with a chief complaint of minimal fatigue upon moderate exertion. He says that this has been present for several years. He denies any difficulty sleeping, abdominal distention, palpitations, pedal edema, chest pain, shortness of breath, cough, dizziness or weight gain. He is still waiting to hear from [employer] regarding entresto use.   Past Medical History:  Diagnosis Date  . CHF (congestive heart failure) ([place])   . GERD (gastroesophageal reflux disease)   . Hypertension   . Varices, esophageal ([place])    Past Surgical History:  Procedure Laterality Date  . CARDIAC CATHETERIZATION    . CATARACT EXTRACTION W/PHACO Left 01/21/2017   Procedure: CATARACT EXTRACTION PHACO AND INTRAOCULAR LENS PLACEMENT (IOC);  Surgeon: Birder Robson, MD;  Location: [facility] OR[facility];  Service: Ophthalmology;  Laterality: Left;  [place] 00:48 AP% 21.6 CDE 10.39 Fluid pack lot # [id number]  . CATARACT EXTRACTION W/PHACO Right 02/11/2017   Procedure: CATARACT EXTRACTION PHACO AND INTRAOCULAR LENS PLACEMENT (IOC);  Surgeon: Birder Robson, MD;  Location: [facility] ORS;  Service: Ophthalmology;  Laterality: Right;  [place] 00:42 AP% 22.4 CDE 9.47 Fluid pack lot # [id number]  . COLONOSCOPY    . JOINT REPLACEMENT     TKR  . REPLACEMENT TOTAL KNEE BILATERAL Bilateral   . RIGHT/LEFT HEART CATH AND CORONARY ANGIOGRAPHY  N/A 12/31/2017   Procedure: RIGHT/LEFT HEART CATH AND CORONARY ANGIOGRAPHY;  Surgeon: Nelva Bush, MD;  Location: [place] [facility];  Service: Cardiovascular;  Laterality: N/A;   Family History  Problem Relation Age of Onset  . Brain cancer Mother    Social History   Tobacco Use  . Smoking status: Former Smoker    Types: Cigarettes    Last attempt to quit: 2004    Years since quitting: 15.7  . Smokeless tobacco: Never Used  . Tobacco comment: Quit in 2004  Substance Use Topics  . Alcohol use: Yes    Alcohol/week: 1.0 standard drinks    Types: 1 Cans of beer per week    Frequency: Never    Comment: Every now and then   Allergies  Allergen Reactions  . Lamisil [Terbinafine] Itching and Rash   Prior to Admission medications   Medication Sig Start Date End Date Taking? Authorizing Provider  aspirin EC 81 MG tablet Take 81 mg by mouth daily.   Yes [provider]  atorvastatin (LIPITOR) 20 MG tablet Take 1 tablet (20 mg total) by mouth daily at 6 PM. 07/09/18 10/07/18 Yes End, Harrell Gave, MD  carvedilol (COREG) 6.25 MG tablet Take 1 tablet (6.25 mg total) by mouth 2 (two) times daily. 04/15/18  Yes End, Harrell Gave, MD  Cyanocobalamin 3000 MCG SUBL Place 3,000 mcg under the tongue once a week.    Yes [provider]  folic acid (FOLVITE) 1 MG tablet Take 1 mg by  mouth daily.   Yes [provider]  furosemide (LASIX) 20 MG tablet Take 1 tablet (20 mg total) by mouth daily. 09/02/18  Yes Loris Winrow, Otila Kluver A, FNP  losartan (COZAAR) 50 MG tablet Take 1 tablet (50 mg total) by mouth daily. 07/08/18 10/06/18 Yes End, Harrell Gave, MD  methotrexate (RHEUMATREX) 2.5 MG tablet Take 20 mg by mouth once a week. Caution:Chemotherapy. Protect from light.   Yes [provider]  pantoprazole (PROTONIX) 40 MG tablet Take 40 mg by mouth daily as needed.    Yes [provider]  triamcinolone ointment (KENALOG) 0.1 % Apply 1 application topically 3 (three)  times daily as needed (itching/eczema).   Yes [provider]    Review of Systems  Constitutional: Positive for fatigue (mild). Negative for appetite change.  HENT: Negative for congestion, postnasal drip and sore throat.   Eyes: Negative.   Respiratory: Negative for cough, chest tightness and shortness of breath.   Cardiovascular: Negative for chest pain, palpitations and leg swelling.  Gastrointestinal: Negative for abdominal distention and abdominal pain.  Endocrine: Negative.   Genitourinary: Negative.   Musculoskeletal: Negative for back pain and neck pain.  Skin: Negative.        Itching "all over" due to eczema  Allergic/Immunologic: Negative.   Neurological: Negative for dizziness and light-headedness.  Hematological: Negative for adenopathy. Does not bruise/bleed easily.  Psychiatric/Behavioral: Negative for dysphoric mood and sleep disturbance. The patient is not nervous/anxious.    Vitals:   09/02/18 0856  BP: (!) 154/85  Pulse: 66  Resp: 18  SpO2: 100%  Weight: 180 lb 2 oz (81.7 kg)  Height: 5\' 10"  (1.778 m)   Wt Readings from Last 3 Encounters:  09/02/18 180 lb 2 oz (81.7 kg)  07/08/18 178 lb 8 oz (81 kg)  06/03/18 177 lb (80.3 kg)   Lab Results  Component Value Date   CREATININE 1.15 07/22/2018   CREATININE 1.05 07/08/2018   CREATININE 1.13 03/04/2018    Physical Exam  Constitutional: He is oriented to person, place, and time. He appears well-developed and well-nourished.  HENT:  Head: Normocephalic and atraumatic.  Neck: Normal range of motion. Neck supple. No JVD present.  Cardiovascular: Normal rate and regular rhythm.  Pulmonary/Chest: Effort normal. He has no wheezes. He has no rales.  Abdominal: He exhibits no distension. There is no tenderness.  Musculoskeletal: He exhibits no edema or tenderness.  Neurological: He is alert and oriented to person, place, and time.  Skin: Skin is warm and dry.  Psychiatric: He has a normal mood and  affect. His behavior is normal. Thought content normal.  Nursing note and vitals reviewed.  Assessment & Plan:  1: Chronic heart failure with reduced ejection fraction- - NYHA class II - euvolemic today - weighing daily. Reminded to call for an overnight weight gain of >2 pounds or a weekly weight gain of >5 pounds - weight up 3 pounds since he was last here 3 months ago - not adding salt to his food. Reminded to keep daily sodium intake to 2000mg  daily. Using Mrs[employer] Dash & No salt says that his appetite is "great" - saw cardiology (End) 07/08/18 - PharmD reconciled medications with patient - CMA called [employer] who said they had received patient's income information but did not do anything else with the information. CMA was told that we should hear something within 24 hours - advised patient that the plan would be to either increase his losartan or change to entresto depending on what  the [employer] says  2: HTN- - BP mildly elevated today but the says that he's been out of furosemide for ~ 1 month. He says that it said no refills so he thought he didn't need to take it anymore - new RX for furosemide sent in today - BMP from 07/22/18 reviewed and showed sodium 142, potassium 4.0, creatinine 1.15  and GFR >60 - saw PCP Sabra Heck) 06/22/18  Reviewed medication bottles.  Return in 1 month or sooner for any questions/problems before then.

## 2018-09-02 ENCOUNTER — Ambulatory Visit: Payer: Medicare PPO | Attending: Family | Admitting: Family

## 2018-09-02 ENCOUNTER — Encounter: Payer: Self-pay | Admitting: Family

## 2018-09-02 VITALS — BP 154/85 | HR 66 | Resp 18 | Ht 70.0 in | Wt 180.1 lb

## 2018-09-02 DIAGNOSIS — Z79899 Other long term (current) drug therapy: Secondary | ICD-10-CM | POA: Diagnosis not present

## 2018-09-02 DIAGNOSIS — I509 Heart failure, unspecified: Secondary | ICD-10-CM | POA: Diagnosis not present

## 2018-09-02 DIAGNOSIS — Z96653 Presence of artificial knee joint, bilateral: Secondary | ICD-10-CM | POA: Diagnosis not present

## 2018-09-02 DIAGNOSIS — K219 Gastro-esophageal reflux disease without esophagitis: Secondary | ICD-10-CM | POA: Insufficient documentation

## 2018-09-02 DIAGNOSIS — Z87891 Personal history of nicotine dependence: Secondary | ICD-10-CM | POA: Diagnosis not present

## 2018-09-02 DIAGNOSIS — Z7982 Long term (current) use of aspirin: Secondary | ICD-10-CM | POA: Insufficient documentation

## 2018-09-02 DIAGNOSIS — I2510 Atherosclerotic heart disease of native coronary artery without angina pectoris: Secondary | ICD-10-CM | POA: Diagnosis not present

## 2018-09-02 DIAGNOSIS — I110 Hypertensive heart disease with heart failure: Secondary | ICD-10-CM | POA: Diagnosis not present

## 2018-09-02 DIAGNOSIS — I10 Essential (primary) hypertension: Secondary | ICD-10-CM

## 2018-09-02 DIAGNOSIS — I5022 Chronic systolic (congestive) heart failure: Secondary | ICD-10-CM

## 2018-09-02 MED ORDER — FUROSEMIDE 20 MG PO TABS: 20.0000 mg | ORAL_TABLET | Freq: Every day | ORAL | 5 refills | Status: DC

## 2018-09-02 NOTE — Patient Instructions (Signed)
Continue weighing daily and call for an overnight weight gain of > 2 pounds or a weekly weight gain of >5 pounds. 

## 2018-09-03 ENCOUNTER — Ambulatory Visit: Payer: Medicare PPO | Admitting: Family

## 2018-09-24 MED ORDER — PERMETHRIN 5 % TOPICAL CREAM
1 refills | 0 days | Status: CP
Start: 2018-09-24 — End: ?

## 2018-09-24 MED ORDER — IVERMECTIN 3 MG TABLET
ORAL_TABLET | 0 refills | 0 days | Status: CP
Start: 2018-09-24 — End: ?

## 2018-09-24 MED ORDER — PREDNISONE 10 MG TABLET
ORAL_TABLET | 0 refills | 0 days | Status: CP
Start: 2018-09-24 — End: 2019-07-27

## 2018-09-24 NOTE — Unmapped (Signed)
After receiving a call that Mr. Jury's itch had flared, I called and spoke with him.  He is currently taking the methotrexate 20 mg once weekly and says that this has not been very helpful.  It sounds like he has been approved for dupilumab but he has also not started this.  We discussed that it is unusual for the methotrexate to not be at all helpful.  We discussed that for thoroughness sake I would like to go ahead and treat him for scabies and we discussed how to do this including washing bedding and clothes in hot water.  I have called in both ivermectin and permethrin for him to use once, and then once again in 1 week.  I have also sent in a low-dose taper of prednisone to get him through for now.  I recommended that he wrap his wrists and thickly applied triamcinolone and Saran wrap for symptomatic relief.  I will try to request his outside biopsy result if I am able.  We again discussed my concern that his skin may be reacting to an outside agent and I recommended for him to be sure to avoid all perfumes, colognes, and rubbers as much as possible.  He golfs multiple times per week and I asked him to stop doing this for now to see if things improve.

## 2018-10-01 NOTE — Progress Notes (Signed)
Patient ID: Earl Hays, male    DOB: 02-04-1941, 77 y.o.   MRN: [id number]  HPI  [name] is a 77 y/o male with a history of GERD, HTN, eczema, previous tobacco use and chronic heart failure.   Echo report done 04/29/18 reviewed and showed an EF of 30-35%. Echo report done on 12/30/17 reviewed and showed an EF of 30-35% along with mild/mod MR and moderately increased PA pressure of 45 mm Hg.   Cardiac catheterization done 12/31/17 showed mild-moderate non-obstructive CAD along with elevated left and right pressures.  He has not been admitted or been in the ED in the last 6 months.   He presents today for a follow-up visit with a chief complaint of minimal fatigue upon moderate exertion. He describes this as chronic in nature having been present for several years. He has no associated symptoms and currently denies any difficulty sleeping, abdominal distention, palpitations, pedal edema, chest pain, shortness of breath, cough, dizziness or weight gain. Continues to take losartan as he's still waiting on his shipment of entresto from [employer].   Past Medical History:  Diagnosis Date  . CHF (congestive heart failure) ([place])   . GERD (gastroesophageal reflux disease)   . Hypertension   . Varices, esophageal ([place])    Past Surgical History:  Procedure Laterality Date  . CARDIAC CATHETERIZATION    . CATARACT EXTRACTION W/PHACO Left 01/21/2017   Procedure: CATARACT EXTRACTION PHACO AND INTRAOCULAR LENS PLACEMENT (IOC);  Surgeon: Birder Robson, MD;  Location: [facility];  Service: Ophthalmology;  Laterality: Left;  [place] 00:48 AP% 21.6 CDE 10.39 Fluid pack lot # [id number]  . CATARACT EXTRACTION W/PHACO Right 02/11/2017   Procedure: CATARACT EXTRACTION PHACO AND INTRAOCULAR LENS PLACEMENT (IOC);  Surgeon: Birder Robson, MD;  Location: [facility];  Service: Ophthalmology;  Laterality: Right;  [place] 00:42 AP% 22.4 CDE 9.47 Fluid pack lot # [id number]  . COLONOSCOPY    . JOINT REPLACEMENT     TKR  .  REPLACEMENT TOTAL KNEE BILATERAL Bilateral   . RIGHT/LEFT HEART CATH AND CORONARY ANGIOGRAPHY N/A 12/31/2017   Procedure: RIGHT/LEFT HEART CATH AND CORONARY ANGIOGRAPHY;  Surgeon: Nelva Bush, MD;  Location: [place] [facility];  Service: Cardiovascular;  Laterality: N/A;   Family History  Problem Relation Age of Onset  . Brain cancer Mother    Social History   Tobacco Use  . Smoking status: Former Smoker    Types: Cigarettes    Last attempt to quit: 2004    Years since quitting: 15.7  . Smokeless tobacco: Never Used  . Tobacco comment: Quit in 2004  Substance Use Topics  . Alcohol use: Yes    Alcohol/week: 1.0 standard drinks    Types: 1 Cans of beer per week    Frequency: Never    Comment: Every now and then   Allergies  Allergen Reactions  . Lamisil [Terbinafine] Itching and Rash   Prior to Admission medications   Medication Sig Start Date End Date Taking? Authorizing Provider  aspirin EC 81 MG tablet Take 81 mg by mouth daily.   Yes [provider]  atorvastatin (LIPITOR) 20 MG tablet Take 1 tablet (20 mg total) by mouth daily at 6 PM. 07/09/18 10/07/18 Yes End, Harrell Gave, MD  carvedilol (COREG) 6.25 MG tablet Take 1 tablet (6.25 mg total) by mouth 2 (two) times daily. 04/15/18  Yes End, Harrell Gave, MD  Cyanocobalamin 3000 MCG SUBL Place 3,000 mcg under the tongue once a week.    Yes [provider]  Dupilumab, Asthma, (DUPIXENT [place]) Inject 300 mg into the skin every 14 (fourteen) days.   Yes [provider]  folic acid (FOLVITE) 1 MG tablet Take 1 mg by mouth daily.   Yes [provider]  furosemide (LASIX) 20 MG tablet Take 1 tablet (20 mg total) by mouth daily. 09/02/18  Yes Hackney, Otila Kluver A, FNP  losartan (COZAAR) 50 MG tablet Take 1 tablet (50 mg total) by mouth daily. 07/08/18 10/06/18 Yes End, Harrell Gave, MD  pantoprazole (PROTONIX) 40 MG tablet Take 40 mg by mouth daily as needed.    Yes [provider]  predniSONE  (DELTASONE) 10 MG tablet Take 10 mg by mouth daily with breakfast. 2 tablets daily for 10 days and then 1 tablet once daily until gone.   Yes [provider]  triamcinolone ointment (KENALOG) 0.1 % Apply 1 application topically 3 (three) times daily as needed (itching/eczema).   Yes [provider]    Review of Systems  Constitutional: Positive for fatigue (mild). Negative for appetite change.  HENT: Negative for congestion, postnasal drip and sore throat.   Eyes: Negative.   Respiratory: Negative for cough, chest tightness and shortness of breath.   Cardiovascular: Negative for chest pain, palpitations and leg swelling.  Gastrointestinal: Negative for abdominal distention and abdominal pain.  Endocrine: Negative.   Genitourinary: Negative.   Musculoskeletal: Negative for back pain and neck pain.  Skin: Negative.        Itching "all over" due to eczema  Allergic/Immunologic: Negative.   Neurological: Negative for dizziness and light-headedness.  Hematological: Negative for adenopathy. Does not bruise/bleed easily.  Psychiatric/Behavioral: Negative for dysphoric mood and sleep disturbance. The patient is not nervous/anxious.    Vitals:   10/06/18 1012  BP: (!) 147/70  Pulse: 65  Resp: 18  SpO2: 100%  Weight: 182 lb 8 oz (82.8 kg)  Height: 5\' 10"  (1.778 m)   Wt Readings from Last 3 Encounters:  10/06/18 182 lb 8 oz (82.8 kg)  09/02/18 180 lb 2 oz (81.7 kg)  07/08/18 178 lb 8 oz (81 kg)   Lab Results  Component Value Date   CREATININE 1.15 07/22/2018   CREATININE 1.05 07/08/2018   CREATININE 1.13 03/04/2018    Physical Exam  Constitutional: He is oriented to person, place, and time. He appears well-developed and well-nourished.  HENT:  Head: Normocephalic and atraumatic.  Neck: Normal range of motion. Neck supple. No JVD present.  Cardiovascular: Normal rate and regular rhythm.  Pulmonary/Chest: Effort normal. He has no wheezes. He has no rales.   Abdominal: He exhibits no distension. There is no tenderness.  Musculoskeletal: He exhibits no edema or tenderness.  Neurological: He is alert and oriented to person, place, and time.  Skin: Skin is warm and dry.  Psychiatric: He has a normal mood and affect. His behavior is normal. Thought content normal.  Nursing note and vitals reviewed.  Assessment & Plan:  1: Chronic heart failure with reduced ejection fraction- - NYHA class II - euvolemic today - weighing daily. Reminded to call for an overnight weight gain of >2 pounds or a weekly weight gain of >5 pounds - weight up 2 pounds since he was last here 1 month ago - not adding salt to his food. Reminded to keep daily sodium intake to 2000mg  daily. Using Mrs. Dash & No salt says that his appetite is "great" - saw cardiology (End) 07/08/18 - PharmD reconciled medications with patient - [employer] has approved patient for  entresto but he still hasn't received the medication yet; CMA called [employer] and medication should arrive to patient's home on 10/17 - patient is to finish his losartan (has 4 tablets left) and then begin taking entresto 24/26mg  twice daily. He will call us and let us know that he has received the entresto - check a BMP at his next visit as that will give him ~ 1 month on the entresto - has not received his flu vaccine yet  2: HTN- - BP looks pretty good today; hoping it will improve more once he begins entresto - BMP from 07/22/18 reviewed and showed sodium 142, potassium 4.0, creatinine 1.15  and GFR >60 - saw PCP Sabra Heck) 06/22/18  Reviewed medication bottles.  Return in 1 month or sooner for any questions/problems before then.

## 2018-10-06 ENCOUNTER — Ambulatory Visit: Payer: Medicare PPO | Attending: Family | Admitting: Family

## 2018-10-06 ENCOUNTER — Encounter: Payer: Self-pay | Admitting: Family

## 2018-10-06 VITALS — BP 147/70 | HR 65 | Resp 18 | Ht 70.0 in | Wt 182.5 lb

## 2018-10-06 DIAGNOSIS — Z87891 Personal history of nicotine dependence: Secondary | ICD-10-CM | POA: Diagnosis not present

## 2018-10-06 DIAGNOSIS — I5022 Chronic systolic (congestive) heart failure: Secondary | ICD-10-CM | POA: Diagnosis not present

## 2018-10-06 DIAGNOSIS — Z7982 Long term (current) use of aspirin: Secondary | ICD-10-CM | POA: Diagnosis not present

## 2018-10-06 DIAGNOSIS — Z96653 Presence of artificial knee joint, bilateral: Secondary | ICD-10-CM | POA: Diagnosis not present

## 2018-10-06 DIAGNOSIS — I110 Hypertensive heart disease with heart failure: Secondary | ICD-10-CM | POA: Diagnosis present

## 2018-10-06 DIAGNOSIS — K219 Gastro-esophageal reflux disease without esophagitis: Secondary | ICD-10-CM | POA: Diagnosis not present

## 2018-10-06 DIAGNOSIS — I10 Essential (primary) hypertension: Secondary | ICD-10-CM

## 2018-10-06 DIAGNOSIS — Z79899 Other long term (current) drug therapy: Secondary | ICD-10-CM | POA: Insufficient documentation

## 2018-10-06 NOTE — Patient Instructions (Addendum)
Continue weighing daily and call for an overnight weight gain of > 2 pounds or a weekly weight gain of >5 pounds.  Finish taking your losartan and then the next day, begin entresto by taking 1 tablet twice daily.

## 2018-10-06 NOTE — Progress Notes (Signed)
Follow-up Outpatient Visit Date: 10/07/2018  Primary Care Provider: Rusty Aus, [place] [id number]  Chief Complaint: Follow-up heart failure  HPI:  [name] is a 77 y.o. 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  I last saw Earl Hays in July, at which time he was doing well he also so Darylene Price, NP, yesterday, which time he was doing well.  He had not started [facility] yet, as he was awaiting a shipment from the manufacturer.  He anticipates receiving the shipment tomorrow.  Earl Hays is doing well today without chest pain, shortness of breath, edema, palpitations, lightheadedness, and orthopnea.  He is currently on prednisone for treatment of eczema but homes to come of this in a few days.  He is tolerating his cardiac medications well without adverse effects.  --------------------------------------------------------------------------------------------------  Cardiovascular History & Procedures: Cardiovascular Problems:  Systolic and diastolic heart failure secondary to non-ischemic cardiomyopathy  Non-obstructive coronary artery disease  Risk Factors:  Known coronary artery disease, male gender, and age > 53  Cath/PCI:  LHC/RHC (12/31/17): LMCA normal. LAD with 40% proximal and 20% mid disease. Small RI with 60% proximal stenosis.  LCx with 20% mid/distal stenosis. Normal RCA. LVEDP 25 mmHg. RA 10, RV 40/14, PA 40/21 (27), PCWP 22. Ao sat 94%, PA sat 74%, Fick CO/CI 6.5/3.7.  CV Surgery:  None  EP Procedures and Devices:  None  Non-Invasive Evaluation(s):  Limited TTE (04/29/2018): Mildly dilated LV.  LVEF 30 to 35% with global hypokinesis.  Normal RV size and function.  No  pericardial effusion.  TTE (12/30/17): Upper normal LV size with moderate LVH. LVEF 30 to 35% with diffuse hypokinesis. Grade 2 diastolic dysfunction with elevated filling pressure. Aortic sclerosis and degenerative mitral valve disease. Mild to moderate MR. Mild to moderate LAE. Normal RV size and function. Moderate PH. Small pericardial effusion.  Recent CV Pertinent Labs: Lab Results  Component Value Date   CHOL 219 (H) 07/22/2018   HDL 43 07/22/2018   LDLCALC 143 (H) 07/22/2018   TRIG 165 (H) 07/22/2018   CHOLHDL 5.1 07/22/2018   INR 0.93 12/31/2017   INR 1.1 01/24/2014   K 4.0 07/22/2018   K 3.8 01/24/2014   BUN 22 07/22/2018   BUN 15 07/08/2018   BUN 21 (H) 01/24/2014   CREATININE 1.15 07/22/2018   CREATININE 1.13 01/24/2014    Past medical and surgical history were reviewed and updated in EPIC.  Current Meds  Medication Sig  . aspirin EC 81 MG tablet Take 81 mg by mouth daily.  Marland Kitchen atorvastatin (LIPITOR) 20 MG tablet Take 1 tablet (20 mg total) by mouth daily at 6 PM.  . carvedilol (COREG) 6.25 MG tablet Take 1 tablet (6.25 mg total) by mouth 2 (two) times daily.  . Cyanocobalamin 3000 MCG SUBL Place 3,000 mcg under the tongue once a week.   . Dupilumab, Asthma, (DUPIXENT [place]) Inject 300 mg into the skin every 14 (fourteen) days.  . folic acid (FOLVITE) 1 MG tablet Take 1 mg by mouth daily.  . furosemide (LASIX) 20 MG tablet Take 1 tablet (20 mg total) by mouth daily.  . pantoprazole (PROTONIX) 40 MG tablet Take 40 mg by mouth daily as needed.   . predniSONE (DELTASONE) 10 MG tablet Take 10 mg  by mouth daily with breakfast. 2 tablets daily for 10 days and then 1 tablet once daily until gone.  . triamcinolone ointment (KENALOG) 0.1 % Apply 1 application topically 3 (three) times daily as needed (itching/eczema).    Allergies: Lamisil [terbinafine]  Social History   Tobacco Use  . Smoking status: Former Smoker    Types: Cigarettes    Last attempt to quit: 2004    Years  since quitting: 15.8  . Smokeless tobacco: Never Used  . Tobacco comment: Quit in 2004  Substance Use Topics  . Alcohol use: Yes    Alcohol/week: 1.0 standard drinks    Types: 1 Cans of beer per week    Frequency: Never    Comment: Every now and then  . Drug use: No    Family History  Problem Relation Age of Onset  . Brain cancer Mother     Review of Systems: A 12-system review of systems was performed and was negative except as noted in the HPI.  --------------------------------------------------------------------------------------------------  Physical Exam: BP (!) 146/84 (BP Location: Left Arm, Patient Position: Sitting, Cuff Size: Normal)   Pulse 70   Ht 5\' 10"  (1.778 m)   Wt 184 lb 8 oz (83.7 kg)   BMI 26.47 kg/m   General:  NAD HEENT: No conjunctival pallor or scleral icterus. Moist mucous membranes.  OP clear. Neck: Supple without lymphadenopathy, thyromegaly, JVD, or HJR. Lungs: Normal work of breathing. Clear to auscultation bilaterally without wheezes or crackles. Heart: Regular rate and rhythm without murmurs, rubs, or gallops. Non-displaced PMI. Abd: Bowel sounds present. Soft, NT/ND without hepatosplenomegaly Ext: No lower extremity edema. Skin: Warm and dry without rash.  EKG:  NSR with non-specific T-wave changes.  Lab Results  Component Value Date   WBC 7.4 12/31/2017   HGB 13.9 12/31/2017   HCT 41.3 12/31/2017   MCV 91.8 12/31/2017   PLT 136 (L) 12/31/2017    Lab Results  Component Value Date   NA 142 07/22/2018   K 4.0 07/22/2018   CL 104 07/22/2018   CO2 30 07/22/2018   BUN 22 07/22/2018   CREATININE 1.15 07/22/2018   GLUCOSE 83 07/22/2018   ALT 17 07/22/2018    Lab Results  Component Value Date   CHOL 219 (H) 07/22/2018   HDL 43 07/22/2018   LDLCALC 143 (H) 07/22/2018   TRIG 165 (H) 07/22/2018   CHOLHDL 5.1 07/22/2018     --------------------------------------------------------------------------------------------------  ASSESSMENT AND PLAN: Chronic systolic heart failure due to non-ischemic cardiomyopathy Earl Hays continues to do well; he appears euvolemic on exam with NYHA class I-II symptoms.  Hopefully, he will be able to start Entresto in the next few days with escalation to target dose.  In light of this, I will defer making any medication changes today despite his mildly elevated blood pressure.  Non-obstructive coronary artery disease Asymptomatic.  Continue medical therapy to prevent progression of disease, including ASA 81 mg daily and statin therapy.  Hyperlipidemia Earl Hays is tolerating atorvastatin well.  We will check a lipid panel and ALT today; further dose adjustments to be made based on results.  Hypertension BP mildly elevated.  Hopefully, this will improve with transition from losartan to [facility].  I will not make any medication changes today.  Follow-up: Return to clinic in 3 months.  Nelva Bush, MD 10/07/2018 10:26 PM

## 2018-10-07 ENCOUNTER — Ambulatory Visit (INDEPENDENT_AMBULATORY_CARE_PROVIDER_SITE_OTHER): Payer: Medicare PPO | Admitting: Internal Medicine

## 2018-10-07 ENCOUNTER — Encounter: Payer: Self-pay | Admitting: Internal Medicine

## 2018-10-07 VITALS — BP 146/84 | HR 70 | Ht 70.0 in | Wt 184.5 lb

## 2018-10-07 DIAGNOSIS — E785 Hyperlipidemia, unspecified: Secondary | ICD-10-CM | POA: Diagnosis not present

## 2018-10-07 DIAGNOSIS — I5022 Chronic systolic (congestive) heart failure: Secondary | ICD-10-CM

## 2018-10-07 DIAGNOSIS — I10 Essential (primary) hypertension: Secondary | ICD-10-CM

## 2018-10-07 DIAGNOSIS — I2510 Atherosclerotic heart disease of native coronary artery without angina pectoris: Secondary | ICD-10-CM | POA: Diagnosis not present

## 2018-10-07 DIAGNOSIS — E782 Mixed hyperlipidemia: Secondary | ICD-10-CM | POA: Insufficient documentation

## 2018-10-07 DIAGNOSIS — I428 Other cardiomyopathies: Secondary | ICD-10-CM | POA: Diagnosis not present

## 2018-10-07 NOTE — Patient Instructions (Signed)
Medication Instructions:  Your physician recommends that you continue on your current medications as directed. Please refer to the Current Medication list given to you today.  If you need a refill on your cardiac medications before your next appointment, please call your pharmacy.   Lab work: Your physician recommends that you return for lab work in: TODAY - LIPID, ALT.  If you have labs (blood work) drawn today and your tests are completely normal, you will receive your results only by: Marland Kitchen MyChart Message (if you have MyChart) OR . A paper copy in the mail If you have any lab test that is abnormal or we need to change your treatment, we will call you to review the results.  Testing/Procedures: none  Follow-Up: At [facility], you and your health needs are our priority.  As part of our continuing mission to provide you with exceptional heart care, we have created designated Provider Care Teams.  These Care Teams include your primary Cardiologist (physician) and Advanced Practice Providers (APPs -  Physician Assistants and Nurse Practitioners) who all work together to provide you with the care you need, when you need it. You will need a follow up appointment in 3 months.  You may see Dr Harrell Gave End or one of the following Advanced Practice Providers on your designated Care Team:   Murray Hodgkins, NP Christell Faith, PA-C Marrianne Mood, PA-C

## 2018-10-08 LAB — LIPID PANEL
Chol/HDL Ratio: 2.3 ratio (ref 0.0–5.0)
Cholesterol, Total: 153 mg/dL (ref 100–199)
HDL: 68 mg/dL (ref >39)
LDL Calculated: 50 mg/dL (ref 0–99)
Triglycerides: 176 mg/dL — ABNORMAL HIGH (ref 0–149)
VLDL Cholesterol Cal: 35 mg/dL (ref 5–40)

## 2018-10-08 LAB — ALT: ALT: 23 IU/L (ref 0–44)

## 2018-10-14 ENCOUNTER — Encounter: Admit: 2018-10-14 | Discharge: 2018-10-15 | Payer: BLUE CROSS/BLUE SHIELD

## 2018-10-14 DIAGNOSIS — L2089 Other atopic dermatitis: Secondary | ICD-10-CM

## 2018-10-14 DIAGNOSIS — L309 Dermatitis, unspecified: Principal | ICD-10-CM

## 2018-10-14 DIAGNOSIS — R21 Rash and other nonspecific skin eruption: Secondary | ICD-10-CM

## 2018-10-14 DIAGNOSIS — Z79899 Other long term (current) drug therapy: Secondary | ICD-10-CM

## 2018-10-14 MED ORDER — CLOBETASOL 0.05 % TOPICAL OINTMENT
Freq: Two times a day (BID) | TOPICAL | 5 refills | 0 days | Status: CP
Start: 2018-10-14 — End: 2019-07-27

## 2018-10-14 MED ORDER — TRIAMCINOLONE ACETONIDE 0.1 % TOPICAL OINTMENT
6 refills | 0 days | Status: CP
Start: 2018-10-14 — End: 2019-07-27

## 2018-10-14 MED ORDER — DUPILUMAB 300 MG/2 ML SUBCUTANEOUS SYRINGE
1 refills | 0 days | Status: CP
Start: 2018-10-14 — End: 2019-01-13

## 2018-10-14 NOTE — Unmapped (Signed)
Basic Skin Care  Your skin plays an important role in keeping the entire body healthy. Below are some tips on how to try and maximize skin health from the outside in.  1. Bathe in mildly warm water every 1 to 2 days, followed by light drying and an application of a thick moisturizer cream or ointment, preferably one that comes in a tub.  a. Fragrance free moisturizing bars or body washes are preferred such as Purpose, Cetaphil, Dove sensitive skin, Aveeno, or Vanicream products.  b. Use a fragrance free cream or ointment, not a lotion, such as plain petroleum jelly or Vaseline ointment, Aquaphor, Vanicream, Eucerin cream or a generic version, CeraVe Cream, Cetaphil Restoraderm, Aveeno Eczema Therapy and [employer], among others.  c. People with very dry skin often need to put on these creams two, three or four times a day. As much as possible, use these creams enough to keep the skin from looking dry.  d. Consider using fragrance free/dye free detergent, such as Arm and Hammer for sensitive skin, Tide Free or All Free.  2. If I am prescribing a medication to go on the skin, the medicine goes on first to the areas that need it, followed by a thick cream as above to the entire body.  3. Wynelle Link is a major cause of damage to the skin.  a. I recommend sun protection for all of my patients. I prefer physical barriers such as hats with wide brims that cover the ears, long sleeve clothing with SPF protection including rash guards for swimming. These can be found seasonally at outdoor clothing companies, [employer] and [employer] and online at [email or web address] [email or web address], www.uvskinz.com and BrideEmporium.nl. Avoid peak sun between the hours of 10am to 3pm to minimize sun exposure.  b. I recommend sunscreen for all of my patients older than 43 months of age when in the sun, preferably with broad spectrum coverage and SPF 30 or higher.  i. For sensitive skin: I recommend sunscreens that only contain titanium dioxide and/or zinc oxide in the active ingredients. These do not burn the eyes and appear to be safer than chemical sunscreens. Products includ Vanicream Broad Spectrum 50+, Aveeno Natural Mineral Protection, Neutrogena Pure and Free Baby, Johnson [employer] Daily face and body lotion, and EltaMD.  ii. There is no such thing as waterproof sunscreen. All sunscreens should be reapplied after 60-80 minutes of wear.  Iii. For those that prefer not to use cream-based sunscreen, there are alternatives such as SPRAYS and STICKS. These are also good options if you are sweaty or working out. Spray on sunscreens often use chemical sunscreens which do protect against the sun. However, these can be difficult to apply correctly, especially if wind is present, and can be more likely to irritate the skin. I recommend applying it at least twice over the involved areas. For the stick, I recommend Neutrogena. It looks like a deodorant stick. It must be applied 4 times over the same area to protect as much as the cream.

## 2018-10-14 NOTE — Unmapped (Signed)
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 He discontinued methotrexate 20mg  weekly because he felt as though it was not working. Trialed empiric treatment for scabies without response.  - See media tap for biopsy report from outside dermatologist [facility]) for confirmation of eczematous dermatitis  - Continue with dupixent 300mg  q2 weeks  - Continue rest of prednisone, has 1 day left of 10mg  daily  - Patient has self-discontinued methotrexate 20mg  weekly  - Re-start topical corticosteroids (topical triamcinolone 0.1% ointment and clobetasol 0.05% ointment)  - Recommended daily application of thick emollients  - He has reportedly had patch testing at an outside dermatologist [facility]) which was reportedly only positive for gold. Could consider repeating this in the future as distribution today very symmetric, which could be c/w component of ACD  -He is not a diabetic but does have heart failure therefore we will try to avoid prednisone as much as possible.    Return to clinic: 3 months.     CHIEF COMPLAINT:  Follow up eczema    HPI:   This is a pleasant 77 y.o.-year-old who was last seen by Dr. Linton Rump 2 months ago for eczema. At this time, he was started on dupixent and continued on MTX 20mg  weekly along with FA. He presents today for follow-up. Since last visit, he had a flare requiring prednisone and he was also treated for scabies in the interim.    Today, he reports that the eczema is slightly better. Continues to have itching mainly in the hands, elbows and lower legs. Current medications include: Dupixent as of 2 weeks ago;  started the dupixent 2 weeks ago with the loading dose. Has not noted much of a difference yet. Stopped the methotrexate couple weeks ago as he ran out and he did not think this was effective. Has not noted a difference since stopping methotrexate. Not doing any topical steroids as he ran out. Currently taking prednisone as well- today is his last day; this helps and this is the only thing that helps.  He also did the ivermectin and permethrin as instructed. This did not make a difference.     PAST MEDICAL HISTORY:  Eczema  Acute on chronic systolic CHF, cardiomyopathy  Reactive airway disease  HTN  ??  No personal history of skin cancer  No diabetes    MEDICATIONS:   Reviewed in Epic.     ALLERGIES:   Terbinafine hcl    SOCIAL HISTORY:  [place] locally  Enjoys golf    REVIEW OF SYSTEMS:  Baseline state of health. No recent illnesses. No other skin complaints.    PHYSICAL EXAMINATION:  Examination in the presence of male chaperone:  General: Well-developed, well-nourished. No acute distress. Neuro: Alert and oriented, answers questions appropriately.  Skin:  Examination by inspection and palpation of the upper and lower body exam, including the scalp, face, neck, chest, back, abdomen, BUE, and BLE was performed and notable for the following:  - There are lichenified plaques distributed symmetrically in the dorsal hands, elbows, upper extremities, posterior thighs, abdomen, axillae  - Diffuse xerosis and plate like scales in the bilateral lower extremities  - All other areas examined were normal or had no significant findings    The patient was seen and examined by Abbie Sons, MD who agrees with the assessment and plan as above.

## 2018-10-28 ENCOUNTER — Other Ambulatory Visit: Payer: Self-pay | Admitting: Internal Medicine

## 2018-11-10 ENCOUNTER — Encounter: Payer: Self-pay | Admitting: Family

## 2018-11-10 ENCOUNTER — Ambulatory Visit: Payer: Medicare PPO | Attending: Family | Admitting: Family

## 2018-11-10 VITALS — BP 113/71 | HR 69 | Resp 18 | Ht 70.0 in | Wt 183.1 lb

## 2018-11-10 DIAGNOSIS — L309 Dermatitis, unspecified: Secondary | ICD-10-CM | POA: Diagnosis not present

## 2018-11-10 DIAGNOSIS — Z79899 Other long term (current) drug therapy: Secondary | ICD-10-CM | POA: Insufficient documentation

## 2018-11-10 DIAGNOSIS — Z883 Allergy status to other anti-infective agents status: Secondary | ICD-10-CM | POA: Diagnosis not present

## 2018-11-10 DIAGNOSIS — R5383 Other fatigue: Secondary | ICD-10-CM | POA: Insufficient documentation

## 2018-11-10 DIAGNOSIS — I110 Hypertensive heart disease with heart failure: Secondary | ICD-10-CM | POA: Diagnosis not present

## 2018-11-10 DIAGNOSIS — Z87891 Personal history of nicotine dependence: Secondary | ICD-10-CM | POA: Diagnosis not present

## 2018-11-10 DIAGNOSIS — I10 Essential (primary) hypertension: Secondary | ICD-10-CM

## 2018-11-10 DIAGNOSIS — I5022 Chronic systolic (congestive) heart failure: Secondary | ICD-10-CM

## 2018-11-10 DIAGNOSIS — Z96653 Presence of artificial knee joint, bilateral: Secondary | ICD-10-CM | POA: Diagnosis not present

## 2018-11-10 DIAGNOSIS — Z7982 Long term (current) use of aspirin: Secondary | ICD-10-CM | POA: Diagnosis not present

## 2018-11-10 DIAGNOSIS — K219 Gastro-esophageal reflux disease without esophagitis: Secondary | ICD-10-CM | POA: Diagnosis not present

## 2018-11-10 LAB — BASIC METABOLIC PANEL
Anion gap: 5 (ref 5–15)
BUN: 18 mg/dL (ref 8–23)
CO2: 34 mmol/L — ABNORMAL HIGH (ref 22–32)
Calcium: 9.1 mg/dL (ref 8.9–10.3)
Chloride: 103 mmol/L (ref 98–111)
Creatinine, Ser: 1.17 mg/dL (ref 0.61–1.24)
GFR calc Af Amer: >60 mL/min (ref >60)
GFR calc non Af Amer: 59 mL/min — ABNORMAL LOW (ref >60)
Glucose, Bld: 97 mg/dL (ref 70–99)
Potassium: 4.1 mmol/L (ref 3.5–5.1)
Sodium: 142 mmol/L (ref 135–145)

## 2018-11-10 NOTE — Progress Notes (Signed)
Patient ID: Earl Hays, male    DOB: [birth year], 77 y.o.   MRN: [id number]  HPI  Earl Hays is a 77 y/o male with a history of GERD, HTN, eczema, previous tobacco use and chronic heart failure.   Echo report done 04/29/18 reviewed and showed an EF of 30-35%. Echo report done on 12/30/17 reviewed and showed an EF of 30-35% along with mild/mod Earl and moderately increased PA pressure of 45 mm Hg.   Cardiac catheterization done 12/31/17 showed mild-moderate non-obstructive CAD along with elevated left and right pressures.  He has not been admitted or been in the ED in the last 6 months.   He presents today for a follow-up visit with a chief complaint of minimal fatigue upon moderate exertion. He describes this as chronic in nature having been present for several years. He has no associated symptoms and currently denies difficulty sleeping, abdominal distention, palpitations, pedal edema, chest pain, shortness of breath, cough, dizziness or weight gain.   Past Medical History:  Diagnosis Date  . CHF (congestive heart failure) ([place])   . GERD (gastroesophageal reflux disease)   . Hypertension   . Varices, esophageal ([place])    Past Surgical History:  Procedure Laterality Date  . CARDIAC CATHETERIZATION    . CATARACT EXTRACTION W/PHACO Left 01/21/2017   Procedure: CATARACT EXTRACTION PHACO AND INTRAOCULAR LENS PLACEMENT (IOC);  Surgeon: Birder Robson, MD;  Location: [facility] ORS;  Service: Ophthalmology;  Laterality: Left;  [place] 00:48 AP% 21.6 CDE 10.39 Fluid pack lot # [id number]  . CATARACT EXTRACTION W/PHACO Right 02/11/2017   Procedure: CATARACT EXTRACTION PHACO AND INTRAOCULAR LENS PLACEMENT (IOC);  Surgeon: Birder Robson, MD;  Location: [facility] ORS;  Service: Ophthalmology;  Laterality: Right;  [place] 00:42 AP% 22.4 CDE 9.47 Fluid pack lot # [id number]  . COLONOSCOPY    . JOINT REPLACEMENT     TKR  . REPLACEMENT TOTAL KNEE BILATERAL Bilateral   . RIGHT/LEFT HEART CATH AND CORONARY ANGIOGRAPHY N/A  12/31/2017   Procedure: RIGHT/LEFT HEART CATH AND CORONARY ANGIOGRAPHY;  Surgeon: Nelva Bush, MD;  Location: [facility];  Service: Cardiovascular;  Laterality: N/A;   Family History  Problem Relation Age of Onset  . Brain cancer Mother    Social History   Tobacco Use  . Smoking status: Former Smoker    Types: Cigarettes    Last attempt to quit: 2004    Years since quitting: 15.8  . Smokeless tobacco: Never Used  . Tobacco comment: Quit in 2004  Substance Use Topics  . Alcohol use: Yes    Alcohol/week: 1.0 standard drinks    Types: 1 Cans of beer per week    Frequency: Never    Comment: Every now and then   Allergies  Allergen Reactions  . Lamisil [Terbinafine] Itching and Rash   Prior to Admission medications   Medication Sig Start Date End Date Taking? Authorizing Provider  aspirin EC 81 MG tablet Take 81 mg by mouth daily.   Yes [provider]  atorvastatin (LIPITOR) 20 MG tablet Take 1 tablet (20 mg total) by mouth daily at 6 PM. 07/09/18 11/11/19 Yes End, Harrell Gave, MD  carvedilol (COREG) 6.25 MG tablet TAKE ONE TABLET BY MOUTH TWICE DAILY 10/29/18  Yes Wellington Hampshire, MD  Cyanocobalamin 3000 MCG SUBL Place 3,000 mcg under the tongue once a week.    Yes [provider]  Dupilumab, Asthma, (DUPIXENT [place]) Inject 300 mg into the skin every 14 (fourteen) days.   Yes  [provider]  furosemide (LASIX) 20 MG tablet Take 1 tablet (20 mg total) by mouth daily. 09/02/18  Yes Devyn Griffing A, FNP  pantoprazole (PROTONIX) 40 MG tablet Take 40 mg by mouth daily.    Yes [provider]  sacubitril-valsartan (ENTRESTO) 24-26 MG Take 1 tablet by mouth 2 (two) times daily.   Yes [provider]  triamcinolone ointment (KENALOG) 0.1 % Apply 1 application topically 3 (three) times daily as needed (itching/eczema).   Yes [provider]  folic acid (FOLVITE) 1 MG tablet Take 1 mg by mouth daily.    [provider]     Review of Systems  Constitutional: Positive for fatigue (mild). Negative for appetite change.  HENT: Negative for congestion, postnasal drip and sore throat.   Eyes: Negative.   Respiratory: Negative for cough, chest tightness and shortness of breath.   Cardiovascular: Negative for chest pain, palpitations and leg swelling.  Gastrointestinal: Negative for abdominal distention and abdominal pain.  Endocrine: Negative.   Genitourinary: Negative.   Musculoskeletal: Negative for back pain and neck pain.  Skin: Negative.   Allergic/Immunologic: Negative.   Neurological: Negative for dizziness and light-headedness.  Hematological: Negative for adenopathy. Does not bruise/bleed easily.  Psychiatric/Behavioral: Negative for dysphoric mood and sleep disturbance. The patient is not nervous/anxious.    Vitals:   11/10/18 1040  BP: 113/71  Pulse: 69  Resp: 18  SpO2: 100%  Weight: 183 lb 2 oz (83.1 kg)  Height: 5\' 10"  (1.778 m)   Wt Readings from Last 3 Encounters:  11/10/18 183 lb 2 oz (83.1 kg)  10/07/18 184 lb 8 oz (83.7 kg)  10/06/18 182 lb 8 oz (82.8 kg)   Lab Results  Component Value Date   CREATININE 1.15 07/22/2018   CREATININE 1.05 07/08/2018   CREATININE 1.13 03/04/2018    Physical Exam  Constitutional: He is oriented to person, place, and time. He appears well-developed and well-nourished.  HENT:  Head: Normocephalic and atraumatic.  Neck: Normal range of motion. Neck supple. No JVD present.  Cardiovascular: Normal rate and regular rhythm.  Pulmonary/Chest: Effort normal. He has no wheezes. He has no rales.  Abdominal: He exhibits no distension. There is no tenderness.  Musculoskeletal: He exhibits no edema or tenderness.  Neurological: He is alert and oriented to person, place, and time.  Skin: Skin is warm and dry.  Psychiatric: He has a normal mood and affect. His behavior is normal. Thought content normal.  Nursing note and vitals reviewed.  Assessment &  Plan:  1: Chronic heart failure with reduced ejection fraction- - NYHA class II - euvolemic today - weighing daily. Reminded to call for an overnight weight gain of >2 pounds or a weekly weight gain of >5 pounds - weight unchanged from last visit 1 month ago - not adding salt to his food. Reminded to keep daily sodium intake to 2000mg  daily. Using Mrs. Dash & No salt says that his appetite is "great" - going to [facility] 3 days/ week - saw cardiology (End) 10/07/18 - PharmD reconciled medications with patient - check BMP today since he's been on entresto - will not titrate medications due to BP - received flu and pneumonia vaccines for this season  2: HTN- - BP looks good today - BMP from 07/22/18 reviewed and showed sodium 142, potassium 4.0, creatinine 1.15  and GFR >60 - saw PCP Sabra Heck) 06/22/18  Reviewed medication bottles.  Return in 6 months or sooner for any questions/problems before then.

## 2018-11-11 ENCOUNTER — Encounter: Payer: Self-pay | Admitting: Family

## 2019-01-06 ENCOUNTER — Ambulatory Visit (INDEPENDENT_AMBULATORY_CARE_PROVIDER_SITE_OTHER): Payer: Medicare HMO | Admitting: Internal Medicine

## 2019-01-06 ENCOUNTER — Encounter: Payer: Self-pay | Admitting: Internal Medicine

## 2019-01-06 VITALS — BP 118/60 | HR 80 | Ht 70.0 in | Wt 183.2 lb

## 2019-01-06 DIAGNOSIS — I428 Other cardiomyopathies: Secondary | ICD-10-CM | POA: Diagnosis not present

## 2019-01-06 DIAGNOSIS — I2510 Atherosclerotic heart disease of native coronary artery without angina pectoris: Secondary | ICD-10-CM | POA: Diagnosis not present

## 2019-01-06 DIAGNOSIS — I5022 Chronic systolic (congestive) heart failure: Secondary | ICD-10-CM

## 2019-01-06 DIAGNOSIS — I10 Essential (primary) hypertension: Secondary | ICD-10-CM

## 2019-01-06 MED ORDER — SPIRONOLACTONE 25 MG PO TABS: 25.0000 mg | ORAL_TABLET | Freq: Every day | ORAL | 3 refills | Status: DC

## 2019-01-06 NOTE — Progress Notes (Signed)
Follow-up Outpatient Visit Date: 01/06/2019  Primary Care Provider: Rusty Aus, MD [place] [address]  Chief Complaint: Follow-up heart failure  HPI:  Earl Hays is a 78 y.o. 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  I last saw Earl Hays in October, at which time he was doing well from a heart standpoint.  He was awaiting supply of Entresto.  Referral to EP for consideration of ICD has been discussed, but given minimal symptoms (NYHA class I), we agreed to defer this and optimize medical therapy.  A, Earl Hays reports that he is feeling well other than a recent URI.  He is currently completing a course of azithromycin but has not noticed much improvement.  Other than this, he has been doing well without chest pain, shortness of breath, palpitations, lightheadedness, orthopnea, and edema.  He has been using a new medication (dupilumab) for his eczema over the last few months with only modest improvement.  He is tolerating Entresto and carvedilol well.  --------------------------------------------------------------------------------------------------  Cardiovascular History & Procedures: Cardiovascular Problems:  Systolic and diastolic heart failure secondary to non-ischemic cardiomyopathy  Non-obstructive coronary artery disease  Risk Factors:  Known coronary artery disease, male gender, and age > 36  Cath/PCI:  LHC/RHC (12/31/17): LMCA normal. LAD with 40% proximal and 20% mid disease. Small RI with 60% proximal stenosis.LCxwith 20% mid/distal stenosis. Normal RCA. LVEDP 25 mmHg. RA 10, RV 40/14, PA 40/21 (27), PCWP 22. Ao sat 94%, PA sat 74%, Fick CO/CI 6.5/3.7.  CV Surgery:  None  EP  Procedures and Devices:  None  Non-Invasive Evaluation(s):  Limited TTE (04/29/2018): Mildly dilated LV. LVEF 30 to 35% with global hypokinesis. Normal RV size and function. No pericardial effusion.  TTE (12/30/17): Upper normal LV size with moderate LVH. LVEF30to35% with diffuse hypokinesis. Grade 2 diastolic dysfunction with elevated filling pressure. Aortic sclerosis and degenerative mitral valve disease. Mild to moderate MR. Mild to moderate LAE. Normal RV size and function. Moderate PH. Small pericardial effusion.  Recent CV Pertinent Labs: Lab Results  Component Value Date   CHOL 153 10/07/2018   HDL 68 10/07/2018   LDLCALC 50 10/07/2018   TRIG 176 (H) 10/07/2018   CHOLHDL 2.3 10/07/2018   CHOLHDL 5.1 07/22/2018   INR 0.93 12/31/2017   INR 1.1 01/24/2014   K 4.1 11/10/2018   K 3.8 01/24/2014   BUN 18 11/10/2018   BUN 15 07/08/2018   BUN 21 (H) 01/24/2014   CREATININE 1.17 11/10/2018   CREATININE 1.13 01/24/2014    Past medical and surgical history were reviewed and updated in EPIC.  Current Meds  Medication Sig  . aspirin EC 81 MG tablet Take 81 mg by mouth daily.  Marland Kitchen atorvastatin (LIPITOR) 20 MG tablet Take 1 tablet (20 mg total) by mouth daily at 6 PM.  . azithromycin (ZITHROMAX) 250 MG tablet 2 tabs on day one, then 1 tab daily x 4 days  . carvedilol (COREG) 6.25 MG tablet TAKE ONE TABLET BY MOUTH TWICE DAILY  . Cyanocobalamin 3000 MCG SUBL Place 3,000 mcg under the tongue once a week.   . Dupilumab, Asthma, (DUPIXENT [place]) Inject 300 mg into the skin every 14 (fourteen) days.  . furosemide (LASIX) 20 MG tablet Take 1 tablet (20 mg total) by mouth daily.  Marland Kitchen  montelukast (SINGULAIR) 10 MG tablet Take 10 mg by mouth at bedtime.   . pantoprazole (PROTONIX) 40 MG tablet Take 40 mg by mouth daily.   . sacubitril-valsartan (ENTRESTO) 24-26 MG Take 1 tablet by mouth 2 (two) times daily.  Marland Kitchen triamcinolone ointment (KENALOG) 0.1 % Apply 1 application topically 3 (three)  times daily as needed (itching/eczema).    Allergies: Lamisil [terbinafine]  Social History   Tobacco Use  . Smoking status: Former Smoker    Types: Cigarettes    Last attempt to quit: 2004    Years since quitting: 16.0  . Smokeless tobacco: Never Used  . Tobacco comment: Quit in 2004  Substance Use Topics  . Alcohol use: Yes    Alcohol/week: 1.0 standard drinks    Types: 1 Cans of beer per week    Frequency: Never    Comment: Every now and then  . Drug use: No    Family History  Problem Relation Age of Onset  . Brain cancer Mother     Review of Systems: A 12-system review of systems was performed and was negative except as noted in the HPI.  --------------------------------------------------------------------------------------------------  Physical Exam: BP 118/60 (BP Location: Left Arm, Patient Position: Sitting, Cuff Size: Normal)   Pulse 80   Ht 5\' 10"  (1.778 m)   Wt 183 lb 4 oz (83.1 kg)   BMI 26.29 kg/m   General: NAD. HEENT: No conjunctival pallor or scleral icterus. Moist mucous membranes.  OP clear. Neck: Supple without lymphadenopathy, thyromegaly, JVD, or HJR. Lungs: Normal work of breathing. Clear to auscultation bilaterally without wheezes or crackles. Heart: Regular rate and rhythm without murmurs, rubs, or gallops. Non-displaced PMI. Abd: Bowel sounds present. Soft, NT/ND without hepatosplenomegaly Ext: No lower extremity edema. Radial, PT, and DP pulses are 2+ bilaterally. Skin: Warm and dry.  Areas of hyperpigmentation and skin thickening noted on both upper extremities.  EKG: Normal sinus rhythm with PACs and nonspecific T wave changes.  PACs are new compared with 10/07/2018.  Otherwise, there has been no significant interval change.  Lab Results  Component Value Date   WBC 7.4 12/31/2017   HGB 13.9 12/31/2017   HCT 41.3 12/31/2017   MCV 91.8 12/31/2017   PLT 136 (L) 12/31/2017    Lab Results  Component Value Date   NA 142 11/10/2018    K 4.1 11/10/2018   CL 103 11/10/2018   CO2 34 (H) 11/10/2018   BUN 18 11/10/2018   CREATININE 1.17 11/10/2018   GLUCOSE 97 11/10/2018   ALT 23 10/07/2018    Lab Results  Component Value Date   CHOL 153 10/07/2018   HDL 68 10/07/2018   LDLCALC 50 10/07/2018   TRIG 176 (H) 10/07/2018   CHOLHDL 2.3 10/07/2018    --------------------------------------------------------------------------------------------------  ASSESSMENT AND PLAN: Chronic systolic heart failure secondary to nonischemic cardiomyopathy Earl Hays continues to do well with NYHA class I symptoms.  He appears euvolemic and well compensated on exam today.  We will continue current doses of carvedilol and Entresto.  We have agreed to add spironolactone 25 mg daily.  I will check a basic metabolic panel today and again in about 1 week.  We discussed role for ICD in primary prevention of sudden cardiac death.  Given class I symptoms and NICM substrate, we have agreed to defer EP referral for now.  We will attempt to optimize evidence-based heart failure therapy.  If symptoms develop and LVEF remains less than 35%, this will need to be reconsidered.  Nonobstructive coronary artery disease Continue aspirin and statin therapy.  Hypertension Blood pressure normal today.  Continue current doses of carvedilol and Entresto.  Add spironolactone, as above.  Follow-up: Return to clinic in 2 months.  Nelva Bush, MD 01/06/2019 1:19 PM

## 2019-01-06 NOTE — Patient Instructions (Signed)
Medication Instructions:  Your physician has recommended you make the following change in your medication:  1- START Spironolactone 25 mg  (1 tablet) by mouth once a day.  If you need a refill on your cardiac medications before your next appointment, please call your pharmacy.   Lab work: Your physician recommends that you return for lab work in: [place] - BMET.   Your physician recommends that you return for lab work in: [place] 01/13/2019 AT [place].  BMET - Please go to the [facility]. You will check in at the front desk to the right as you walk into the atrium. Valet Parking is offered if needed.   If you have labs (blood work) drawn today and your tests are completely normal, you will receive your results only by: Marland Kitchen MyChart Message (if you have MyChart) OR . A paper copy in the mail If you have any lab test that is abnormal or we need to change your treatment, we will call you to review the results.  Testing/Procedures: none  Follow-Up: At [facility], you and your health needs are our priority.  As part of our continuing mission to provide you with exceptional heart care, we have created designated Provider Care Teams.  These Care Teams include your primary Cardiologist (physician) and Advanced Practice Providers (APPs -  Physician Assistants and Nurse Practitioners) who all work together to provide you with the care you need, when you need it. You will need a follow up appointment in 2 months.  Please call our office 2 months in advance to schedule this appointment.  You may see DR Harrell Gave END or one of the following Advanced Practice Providers on your designated Care Team:   Murray Hodgkins, NP Christell Faith, PA-C . Marrianne Mood, PA-C

## 2019-01-07 ENCOUNTER — Telehealth: Payer: Self-pay | Admitting: *Deleted

## 2019-01-07 LAB — BASIC METABOLIC PANEL
BUN/Creatinine Ratio: 18 (ref 10–24)
BUN: 17 mg/dL (ref 8–27)
CO2: 23 mmol/L (ref 20–29)
Calcium: 9 mg/dL (ref 8.6–10.2)
Chloride: 104 mmol/L (ref 96–106)
Creatinine, Ser: 0.95 mg/dL (ref 0.76–1.27)
GFR calc Af Amer: 90 mL/min/{1.73_m2} (ref >59)
GFR calc non Af Amer: 78 mL/min/{1.73_m2} (ref >59)
Glucose: 81 mg/dL (ref 65–99)
Potassium: 3.7 mmol/L (ref 3.5–5.2)
Sodium: 142 mmol/L (ref 134–144)

## 2019-01-07 NOTE — Telephone Encounter (Signed)
-----   Message from Nelva Bush, MD sent at 01/07/2019  7:10 AM EST ----- Please let Earl Hays know that his labs are normal.  He should proceed with starting spironolactone 25 mg daily and have repeat BMP in 1 week.

## 2019-01-07 NOTE — Telephone Encounter (Signed)
Results called to pt. Pt verbalized understanding.  

## 2019-01-10 ENCOUNTER — Other Ambulatory Visit: Payer: Self-pay

## 2019-01-10 DIAGNOSIS — I509 Heart failure, unspecified: Secondary | ICD-10-CM | POA: Insufficient documentation

## 2019-01-10 DIAGNOSIS — K219 Gastro-esophageal reflux disease without esophagitis: Secondary | ICD-10-CM | POA: Diagnosis not present

## 2019-01-10 DIAGNOSIS — I10 Essential (primary) hypertension: Secondary | ICD-10-CM | POA: Diagnosis not present

## 2019-01-10 DIAGNOSIS — Z87891 Personal history of nicotine dependence: Secondary | ICD-10-CM | POA: Insufficient documentation

## 2019-01-10 DIAGNOSIS — R040 Epistaxis: Secondary | ICD-10-CM | POA: Insufficient documentation

## 2019-01-10 DIAGNOSIS — Z79899 Other long term (current) drug therapy: Secondary | ICD-10-CM | POA: Diagnosis not present

## 2019-01-10 NOTE — ED Triage Notes (Signed)
Pt states left sided nare bleeding that began at noon today. Pt without bleeding at this time. Pt appears in no acute distress. Skin normal color warm and dry .

## 2019-01-11 ENCOUNTER — Emergency Department
Admission: EM | Admit: 2019-01-11 | Discharge: 2019-01-11 | Disposition: A | Discharge status: Home / Self Care | Payer: Medicare HMO | Attending: Emergency Medicine | Admitting: Emergency Medicine

## 2019-01-11 DIAGNOSIS — R040 Epistaxis: Secondary | ICD-10-CM

## 2019-01-11 MED ORDER — OXYMETAZOLINE HCL 0.05 % NA SOLN
1.0000 | Freq: Once | NASAL | Status: AC
  Administered 2019-01-11: 1 via NASAL
  Filled 2019-01-11: qty 15

## 2019-01-11 NOTE — ED Notes (Signed)
ED Provider at bedside. 

## 2019-01-11 NOTE — ED Provider Notes (Signed)
[facility] Emergency Department Provider Note  ____________________________________________   First MD Initiated Contact with Patient 01/11/19 0201     (approximate)  I have reviewed the triage vital signs and the nursing notes.   HISTORY  Chief Complaint Epistaxis   HPI Earl Hays is a 78 y.o. male who comes to the emergency department with mild to moderate epistaxis that began acutely at 7 PM today.  He pinched his nose and tilted his head back and felt like he had difficulty stopping the bleed.  He did cough up and vomit once.  He has no history of previous bleeding diathesis.  He does take aspirin every day but no other blood thinning medication.  He does not have seasonal allergies.  He denies trauma.  His bleeding is currently stopped.    Past Medical History:  Diagnosis Date  . CHF (congestive heart failure) ([place])   . GERD (gastroesophageal reflux disease)   . Hypertension   . Varices, esophageal [facility])     Patient Active Problem List   Diagnosis Date Noted  . Hyperlipidemia LDL goal <70 10/07/2018  . NICM (nonischemic cardiomyopathy) ([place]) 07/09/2018  . Eczema 06/03/2018  . Coronary artery disease involving native coronary artery of native heart without angina pectoris 01/14/2018  . Chronic systolic heart failure ([place]) 01/09/2018  . Essential hypertension 01/09/2018  . GERD (gastroesophageal reflux disease) 01/09/2018  . Chronic cough 12/30/2017    Past Surgical History:  Procedure Laterality Date  . CARDIAC CATHETERIZATION    . CATARACT EXTRACTION W/PHACO Left 01/21/2017   Procedure: CATARACT EXTRACTION PHACO AND INTRAOCULAR LENS PLACEMENT (IOC);  Surgeon: Birder Robson, MD;  Location: [facility] ORS;  Service: Ophthalmology;  Laterality: Left;  [place] 00:48 AP% 21.6 CDE 10.39 Fluid pack lot # [id number]  . CATARACT EXTRACTION W/PHACO Right 02/11/2017   Procedure: CATARACT EXTRACTION PHACO AND INTRAOCULAR LENS PLACEMENT (IOC);  Surgeon:  Birder Robson, MD;  Location: [facility] ORS;  Service: Ophthalmology;  Laterality: Right;  [place] 00:42 AP% 22.4 CDE 9.47 Fluid pack lot # [id number]  . COLONOSCOPY    . JOINT REPLACEMENT     TKR  . REPLACEMENT TOTAL KNEE BILATERAL Bilateral   . RIGHT/LEFT HEART CATH AND CORONARY ANGIOGRAPHY N/A 12/31/2017   Procedure: RIGHT/LEFT HEART CATH AND CORONARY ANGIOGRAPHY;  Surgeon: Nelva Bush, MD;  Location: [place] CV LAB;  Service: Cardiovascular;  Laterality: N/A;    Prior to Admission medications   Medication Sig Start Date End Date Taking? Authorizing Provider  aspirin EC 81 MG tablet Take 81 mg by mouth daily.    [provider]  atorvastatin (LIPITOR) 20 MG tablet Take 1 tablet (20 mg total) by mouth daily at 6 PM. 07/09/18 11/11/19  End, Harrell Gave, MD  carvedilol (COREG) 6.25 MG tablet TAKE ONE TABLET BY MOUTH TWICE DAILY 10/29/18   Wellington Hampshire, MD  Cyanocobalamin 3000 MCG SUBL Place 3,000 mcg under the tongue once a week.     [provider]  Dupilumab, Asthma, (DUPIXENT [place]) Inject 300 mg into the skin every 14 (fourteen) days.    [provider]  furosemide (LASIX) 20 MG tablet Take 1 tablet (20 mg total) by mouth daily. 09/02/18   Alisa Graff, FNP  montelukast (SINGULAIR) 10 MG tablet Take 10 mg by mouth at bedtime.  12/28/18 12/28/19  [provider]  pantoprazole (PROTONIX) 40 MG tablet Take 40 mg by mouth daily.     [provider]  sacubitril-valsartan (ENTRESTO) 24-26 MG Take 1  tablet by mouth 2 (two) times daily.    [provider]  spironolactone (ALDACTONE) 25 MG tablet Take 1 tablet (25 mg total) by mouth daily. 01/06/19 04/06/19  End, Harrell Gave, MD  triamcinolone ointment (KENALOG) 0.1 % Apply 1 application topically 3 (three) times daily as needed (itching/eczema).    [provider]    Allergies Lamisil [terbinafine]  Family History  Problem Relation Age of Onset  . Brain cancer Mother     Social  History Social History   Tobacco Use  . Smoking status: Former Smoker    Types: Cigarettes    Last attempt to quit: 2004    Years since quitting: 16.0  . Smokeless tobacco: Never Used  . Tobacco comment: Quit in 2004  Substance Use Topics  . Alcohol use: Yes    Alcohol/week: 1.0 standard drinks    Types: 1 Cans of beer per week    Frequency: Never    Comment: Every now and then  . Drug use: No    Review of Systems Constitutional: No fever/chills ENT: Positive for epistaxis Cardiovascular: Denies chest pain. Respiratory: Denies shortness of breath. Gastrointestinal: No abdominal pain.  No nausea, no vomiting.   Neurological: Negative for headaches   ____________________________________________   PHYSICAL EXAM:  VITAL SIGNS: ED Triage Vitals  Enc Vitals Group     BP 01/10/19 2302 (!) 178/101     Pulse Rate 01/10/19 2302 70     Resp 01/10/19 2302 16     Temp 01/10/19 2302 97.8 F (36.6 C)     Temp Source 01/10/19 2302 Oral     SpO2 01/10/19 2302 98 %     Weight 01/10/19 2303 180 lb (81.6 kg)     Height 01/10/19 2303 5\' 10"  (1.778 m)     Head Circumference --      Peak Flow --      Pain Score 01/10/19 2303 0     Pain Loc --      Pain Edu? --      Excl. in [place]? --     Constitutional: Alert and oriented x4 well-appearing nontoxic no diaphoresis speaks in full clear sentences Head: Atraumatic. Nose: Evidence of anterior previous left-sided epistaxis Mouth/Throat: No trismus Neck: No stridor.   Cardiovascular: Regular rate and rhythm Respiratory: Normal respiratory effort.  No retractions. Neurologic:  Normal speech and language. No gross focal neurologic deficits are appreciated.  Skin:  Skin is warm, dry and intact. No rash noted.    ____________________________________________  LABS (all labs ordered are listed, but only abnormal results are displayed)  Labs Reviewed - No data to  display   __________________________________________  EKG   ____________________________________________  RADIOLOGY   ____________________________________________   DIFFERENTIAL includes but not limited to  Anterior epistaxis, posterior epistaxis, trauma, infection, allergies   PROCEDURES  Procedure(s) performed: no  Procedures  Critical Care performed: no  ____________________________________________   INITIAL IMPRESSION / ASSESSMENT AND PLAN / ED COURSE  Pertinent labs & imaging results that were available during my care of the patient were reviewed by me and considered in my medical decision making (see chart for details).   As part of my medical decision making, I reviewed the following data within the [place] History obtained from family if available, nursing notes, old chart and ekg, as well as notes from prior ED visits.  By the time I saw the patient his bleeding had stopped.  We discussed that he should tilt his head forward not  backwards in the future as tilting it backwards will make him vomit blood.  We discussed putting Vaseline in his nares and beginning allergy medications.  Strict return precautions have been given.  No evidence of posterior bleed.      ____________________________________________   FINAL CLINICAL IMPRESSION(S) / ED DIAGNOSES  Final diagnoses:  Acute anterior epistaxis      NEW MEDICATIONS STARTED DURING THIS VISIT:  Discharge Medication List as of 01/11/2019  2:13 AM       Note:  This document was prepared using Dragon voice recognition software and may include unintentional dictation errors.     Darel Hong, MD 01/16/19 2328

## 2019-01-11 NOTE — ED Notes (Signed)
No peripheral IV placed this visit.   Discharge instructions reviewed with patient. Questions fielded by this RN. Patient verbalizes understanding of instructions. Patient discharged home in stable condition per rifenbark. No acute distress noted at time of discharge.     Pt declined DC VS, "I feel all right"

## 2019-01-11 NOTE — ED Notes (Signed)
Patient c/o epistaxis beginning at 1700. Patient also reports that he feels the blood is running down his throat and he coughed up the blood.

## 2019-01-13 ENCOUNTER — Ambulatory Visit: Admit: 2019-01-13 | Discharge: 2019-01-14 | Payer: BLUE CROSS/BLUE SHIELD

## 2019-01-13 ENCOUNTER — Other Ambulatory Visit
Admission: RE | Admit: 2019-01-13 | Discharge: 2019-01-13 | Disposition: A | Discharge status: Home / Self Care | Payer: Medicare HMO | Source: Ambulatory Visit | Attending: Internal Medicine | Admitting: Internal Medicine

## 2019-01-13 ENCOUNTER — Telehealth: Payer: Self-pay | Admitting: *Deleted

## 2019-01-13 DIAGNOSIS — R21 Rash and other nonspecific skin eruption: Principal | ICD-10-CM

## 2019-01-13 DIAGNOSIS — L309 Dermatitis, unspecified: Secondary | ICD-10-CM

## 2019-01-13 DIAGNOSIS — I10 Essential (primary) hypertension: Secondary | ICD-10-CM | POA: Insufficient documentation

## 2019-01-13 DIAGNOSIS — I5022 Chronic systolic (congestive) heart failure: Secondary | ICD-10-CM | POA: Diagnosis present

## 2019-01-13 LAB — BASIC METABOLIC PANEL
Anion gap: 4 — ABNORMAL LOW (ref 5–15)
BUN: 18 mg/dL (ref 8–23)
CO2: 34 mmol/L — ABNORMAL HIGH (ref 22–32)
Calcium: 9.3 mg/dL (ref 8.9–10.3)
Chloride: 102 mmol/L (ref 98–111)
Creatinine, Ser: 1.15 mg/dL (ref 0.61–1.24)
GFR calc Af Amer: >60 mL/min (ref >60)
GFR calc non Af Amer: >60 mL/min (ref >60)
Glucose, Bld: 97 mg/dL (ref 70–99)
Potassium: 4 mmol/L (ref 3.5–5.1)
Sodium: 140 mmol/L (ref 135–145)

## 2019-01-13 MED ORDER — DUPILUMAB 300 MG/2 ML SUBCUTANEOUS SYRINGE
PRN refills | 0 days | Status: CP
Start: 2019-01-13 — End: ?

## 2019-01-13 NOTE — Telephone Encounter (Signed)
Results called to pt. Pt verbalized understanding. He marked his calendar to go to the [facility] in about 1 month for repeat lab work. BMET order entered.

## 2019-01-13 NOTE — Telephone Encounter (Signed)
-----   Message from Nelva Bush, MD sent at 01/13/2019 11:52 AM EST ----- Please let Mr. Bogosian know that his kidney function and potassium are stable.  He should continue his current medications and have a repeat BMP in 1 month.

## 2019-01-13 NOTE — Unmapped (Signed)
ASSESSMENT AND PLAN:    Eczematous dermatitis, significantly improved and almost resolved with postinflammatory patches on thighs:  -Continue dupilumab (DUPIXENT) 300 mg/2 mL Syrg injection; INJECT 1 SYRINGE EVERY OTHER WEEK FOR MAINTENANCE  Dispense: 4 mL; Refill: 1  -I asked him to please make sure he is up-to-date on getting his next dose from the assistance program on Saturday.  -I encouraged good basic skin care and moisturization.  -Reassurance provided for postinflammatory patches.    Return to clinic: 4 months.    CHIEF COMPLAINT:  Follow-up eczema    HPI:   This is a pleasant 78 y.o.-year-old who last saw me on 10/14/2018.  We have been struggling for a long time with atopic dermatitis involving different areas of his body but especially his hands and wrists.  At last visit the decision was made to start dupilumab.  At last visit he had started dupilumab for 2 weeks.    Since last visit he is done excellent with the dupilumab.  His itch is almost completely resolved.  Hands are significantly better.  He is concerned about patches on his lower extremities which are non-itchy and asymptomatic but appear hyperpigmented.  He is tolerating the dupilumab well, denies side effects including eye dryness or conjunctivitis.    PAST MEDICAL HISTORY:  Eczema  Acute on chronic systolic CHF, cardiomyopathy  Reactive airway disease  HTN  ??  No personal history of skin cancer  No diabetes    MEDICATIONS:   Reviewed in epic.    ALLERGIES:   Terbinafine hcl    SOCIAL HISTORY:  [place]ives locally  Enjoys golf    REVIEW OF SYSTEMS:  Baseline state of health. No recent illnesses. No other skin complaints.    PHYSICAL EXAMINATION:  Examination in the presence of male chaperone:  General: Well-developed, well-nourished. No acute distress. Neuro: Alert and oriented, answers questions appropriately.  Skin: Examination of the scalp, face, neck, chest, abdomen, back, bilateral upper extremities was performed and notable for the following:  Dry skin  Significant improvement of eczematous patches on bilateral hands and arms  Postinflammatory hyperpigmented patches on axilla, scattered on back, and on thighs

## 2019-01-13 NOTE — Unmapped (Signed)
Per test claim for DUPIXENT 300 MG/2 ML SYRINGE at the [facility], patient needs Medication Assistance Program for Prior Authorization.

## 2019-02-12 ENCOUNTER — Other Ambulatory Visit
Admission: RE | Admit: 2019-02-12 | Discharge: 2019-02-12 | Disposition: A | Discharge status: Home / Self Care | Payer: Medicare HMO | Source: Ambulatory Visit | Attending: Internal Medicine | Admitting: Internal Medicine

## 2019-02-12 DIAGNOSIS — I10 Essential (primary) hypertension: Secondary | ICD-10-CM | POA: Diagnosis present

## 2019-02-12 DIAGNOSIS — I5022 Chronic systolic (congestive) heart failure: Secondary | ICD-10-CM | POA: Diagnosis present

## 2019-02-12 LAB — BASIC METABOLIC PANEL
Anion gap: 6 (ref 5–15)
BUN: 19 mg/dL (ref 8–23)
CO2: 30 mmol/L (ref 22–32)
Calcium: 9 mg/dL (ref 8.9–10.3)
Chloride: 105 mmol/L (ref 98–111)
Creatinine, Ser: 1.08 mg/dL (ref 0.61–1.24)
GFR calc Af Amer: >60 mL/min (ref >60)
GFR calc non Af Amer: >60 mL/min (ref >60)
Glucose, Bld: 100 mg/dL — ABNORMAL HIGH (ref 70–99)
Potassium: 3.5 mmol/L (ref 3.5–5.1)
Sodium: 141 mmol/L (ref 135–145)

## 2019-02-15 ENCOUNTER — Encounter: Payer: Self-pay | Admitting: *Deleted

## 2019-02-15 ENCOUNTER — Telehealth: Payer: Self-pay | Admitting: *Deleted

## 2019-02-15 MED ORDER — CARVEDILOL 6.25 MG PO TABS: 6.2500 mg | ORAL_TABLET | Freq: Two times a day (BID) | ORAL | 2 refills | Status: DC

## 2019-02-15 NOTE — Telephone Encounter (Signed)
-----   Message from Nelva Bush, MD sent at 02/14/2019 11:25 AM EST ----- Please let Earl Hays know that his labs are stable.  He should continue his current medications and f/u in the office as previously discussed.

## 2019-02-15 NOTE — Telephone Encounter (Signed)
No answer. Left message to call back on one number listed.  Spoke with patient on the other number and he verbalized understanding of results and plan of care.  He also needed a refill for carvedilol. Rx sent to pharmacy. Letter with results mailed to patient.

## 2019-02-28 ENCOUNTER — Other Ambulatory Visit: Payer: Self-pay

## 2019-02-28 ENCOUNTER — Encounter: Payer: Self-pay | Admitting: Emergency Medicine

## 2019-02-28 ENCOUNTER — Emergency Department
Admission: EM | Admit: 2019-02-28 | Discharge: 2019-02-28 | Disposition: A | Discharge status: Home / Self Care | Payer: Medicare HMO | Attending: Emergency Medicine | Admitting: Emergency Medicine

## 2019-02-28 DIAGNOSIS — R040 Epistaxis: Secondary | ICD-10-CM | POA: Diagnosis not present

## 2019-02-28 DIAGNOSIS — Z5321 Procedure and treatment not carried out due to patient leaving prior to being seen by health care provider: Secondary | ICD-10-CM | POA: Insufficient documentation

## 2019-02-28 MED ORDER — OXYMETAZOLINE HCL 0.05 % NA SOLN
1.0000 | Freq: Once | NASAL | Status: AC
  Administered 2019-02-28: 1 via NASAL
  Filled 2019-02-28: qty 30

## 2019-02-28 NOTE — ED Triage Notes (Signed)
Pt to ED via POV c/o nose bleed. Pt states that it started about 1 hour PTA. Pt states that he had a similar episode about 1 month ago. Nose is oozing blood at this time. Pt is in NAD. Pt denies use of blood thinners.

## 2019-03-03 ENCOUNTER — Other Ambulatory Visit: Payer: Self-pay | Admitting: Family

## 2019-03-03 MED ORDER — FUROSEMIDE 20 MG PO TABS: 20.0000 mg | ORAL_TABLET | Freq: Every day | ORAL | 3 refills | Status: DC

## 2019-03-16 ENCOUNTER — Telehealth: Payer: Self-pay | Admitting: *Deleted

## 2019-03-16 NOTE — Telephone Encounter (Signed)
TELEPHONE CALL NOTE  Earl Hays has been deemed a candidate for a follow-up tele-health visit to limit community exposure during the Covid-19 pandemic. I spoke with the patient via phone to ensure availability of phone/video source, confirm preferred email & phone number, discuss instructions and expectations, and review consent.   I reminded Earl Hays to be prepared with any vital sign and/or heart rhythm information that could potentially be obtained via home monitoring, at the time of his visit.  Finally, I reminded Earl Hays to expect an e-mail containing a link for their video-based visit approximately 15 minutes before his visit, or alternatively, a phone call at the time of his visit if his visit is planned to be a phone encounter.  Did the patient verbally consent to treatment as below? YES  Earl Hays, CMA 03/16/2019 1:13 PM  DOWNLOADING THE SOFTWARE (If applicable)  Download the [employer] app to enable video and telephone visits with your [facility] Provider.   Instructions for downloading [employer] Web[employer]: - Go to https://www.webex.com/downloads.html and follow the instructions - If you have technical difficulties with downloading WebEx, please call WebEx at [phone]. - Once the app is downloaded (can be done on either mobile or desktop computer), go to Settings in the upper left hand corner.  Be sure that camera and audio are enabled.  - You will receive an email message with a link to the meeting with a time to join for your tele-health visit.  - Please download the app and have settings configured prior to the appointment time.    CONSENT FOR TELE-HEALTH VISIT - PLEASE REVIEW  I hereby voluntarily request, consent and authorize [facility] and its employed or contracted physicians, physician assistants, nurse practitioners or other licensed health care professionals (the Practitioner), to provide me with telemedicine health care services (the  "Services") as deemed necessary by the treating Practitioner. I acknowledge and consent to receive the Services by the Practitioner via telemedicine. I understand that the telemedicine visit will involve communicating with the Practitioner through live audiovisual communication technology and the disclosure of certain medical information by electronic transmission. I acknowledge that I have been given the opportunity to request an in-person assessment or other available alternative prior to the telemedicine visit and am voluntarily participating in the telemedicine visit.  I understand that I have the right to withhold or withdraw my consent to the use of telemedicine in the course of my care at any time, without affecting my right to future care or treatment, and that the Practitioner or I may terminate the telemedicine visit at any time. I understand that I have the right to inspect all information obtained and/or recorded in the course of the telemedicine visit and may receive copies of available information for a reasonable fee.  I understand that some of the potential risks of receiving the Services via telemedicine include:  Marland Kitchen Delay or interruption in medical evaluation due to technological equipment failure or disruption; . Information transmitted may not be sufficient (e.g. poor resolution of images) to allow for appropriate medical decision making by the Practitioner; and/or  . In rare instances, security protocols could fail, causing a breach of personal health information.  Furthermore, I acknowledge that it is my responsibility to provide information about my medical history, conditions and care that is complete and accurate to the best of my ability. I acknowledge that Practitioner's advice, recommendations, and/or decision may be based on factors not within their control, such as  incomplete or inaccurate data provided by me or distortions of diagnostic images or specimens that may result from  electronic transmissions. I understand that the practice of medicine is not an exact science and that Practitioner makes no warranties or guarantees regarding treatment outcomes. I acknowledge that I will receive a copy of this consent concurrently upon execution via email to the email address I last provided but may also request a printed copy by calling the office of [place].    I understand that my insurance will be billed for this visit.   I have read or had this consent read to me. . I understand the contents of this consent, which adequately explains the benefits and risks of the Services being provided via telemedicine.  . I have been provided ample opportunity to ask questions regarding this consent and the Services and have had my questions answered to my satisfaction. . I give my informed consent for the services to be provided through the use of telemedicine in my medical care  By participating in this telemedicine visit I agree to the above.

## 2019-03-17 NOTE — Progress Notes (Signed)
Virtual Visit via Telephone Note    Evaluation Performed:  Follow-up visit  This visit type was conducted due to national recommendations for restrictions regarding the COVID-19 Pandemic (e.g. social distancing).  This format is felt to be most appropriate for this patient at this time.  All issues noted in this document were discussed and addressed.  No physical exam was performed (except for noted visual exam findings with Video Visits).  Please refer to the patient's chart (MyChart message for video visits and phone note for telephone visits) for the patient's consent to telehealth for [facility]  Date:  03/19/2019   ID:  Earl Hays, DOB May 06, 1941, MRN [id number]  Patient Location: [place] [address]   Provider location:   [facility] [address], [place], [place] [address]   PCP:  Rusty Aus, MD  Cardiologist:  Nelva Bush, MD Electrophysiologist:  None   Chief Complaint:  Follow-up heart failure  History of Present Illness:    Earl Hays is a 78 y.o. male who presents via audio/video conferencing for a telehealth visit today.  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  I last saw him in the office in 12/2018, at which time he was doing well other than a recent URI for which she was prescribed azithromycin.  The symptoms have now resolved.  Overall, [name] reports that he is doing well.  His only concern is of continued issues related to eczema.  He is now using injections every other week but has experienced some difficulty with administering the medication well.  At times, he feels like not all of the medication is being administered.  He has  also been applying triamcinolone cream to his legs but continues to have quite a bit of itching.  From a heart standpoint, [name] continues to do well.  He denies chest pain, shortness of breath, palpitations, lightheadedness, orthopnea, and edema.  His weight at home is 176 pounds.  He is tolerating his current medications well, including carvedilol, Entresto, and spironolactone.  He does not monitor his blood pressure at home.  A few weeks, ago Mr. Skeels had a significant nosebleed that led him to the emergency department.  He denies trauma to the nose.  With nasal spray, he was able to get it under control.  It has not recurred.  The patient does not endorse symptoms concerning for COVID-19 infection (fever, chills, cough, or new SHORTNESS OF BREATH).    Prior CV studies:   The following studies were reviewed today:  Limited TTE (04/29/2018): Mildly dilated LV.  LVEF 30-35% with diffuse hypokinesis.  Normal RV size and function.  No significant valvular abnormality.  R/LHC (12/31/2017): Nonobstructive CAD, including normal LMCA, 40% proximal LAD, 20% mid LAD, 60% ramus, and 20% mid LCx stenoses.  RA 10, RV 40/14, PA 40/21 (27), and PCWP 22 mmHg.  PA sat 74%.  Fick CO/CI 6.5/3.7.  LVEDP 25 mmHg.  Past Medical History:  Diagnosis Date  . CHF (congestive heart failure) ([place])   . Coronary artery disease 12/2017   Non-obstructive by cath (12/31/2017)  . GERD (gastroesophageal reflux disease)   . Hypertension   . Varices, esophageal ([place])    Past Surgical History:  Procedure Laterality  Date  . CARDIAC CATHETERIZATION    . CATARACT EXTRACTION W/PHACO Left 01/21/2017   Procedure: CATARACT EXTRACTION PHACO AND INTRAOCULAR LENS PLACEMENT (IOC);  Surgeon: Birder Robson, MD;  Location: [facility] ORS;  Service: Ophthalmology;  Laterality: Left;  [place] 00:48 AP% 21.6 CDE 10.39 Fluid pack lot # [id number]  . CATARACT EXTRACTION W/PHACO Right 02/11/2017   Procedure: CATARACT EXTRACTION PHACO AND INTRAOCULAR  LENS PLACEMENT (IOC);  Surgeon: Birder Robson, MD;  Location: [facility] ORS;  Service: Ophthalmology;  Laterality: Right;  [place] 00:42 AP% 22.4 CDE 9.47 Fluid pack lot # [id number]  . COLONOSCOPY    . JOINT REPLACEMENT     TKR  . REPLACEMENT TOTAL KNEE BILATERAL Bilateral   . RIGHT/LEFT HEART CATH AND CORONARY ANGIOGRAPHY N/A 12/31/2017   Procedure: RIGHT/LEFT HEART CATH AND CORONARY ANGIOGRAPHY;  Surgeon: Nelva Bush, MD;  Location: [facility] CV LAB;  Service: Cardiovascular;  Laterality: N/A;     Current Meds  Medication Sig  . aspirin EC 81 MG tablet Take 81 mg by mouth daily.  Marland Kitchen atorvastatin (LIPITOR) 20 MG tablet Take 1 tablet (20 mg total) by mouth daily at 6 PM.  . carvedilol (COREG) 6.25 MG tablet Take 1 tablet (6.25 mg total) by mouth 2 (two) times daily.  . Cyanocobalamin 3000 MCG SUBL Place 3,000 mcg under the tongue once a week.   . Dupilumab, Asthma, (DUPIXENT [place]) Inject 300 mg into the skin every 14 (fourteen) days.  . furosemide (LASIX) 20 MG tablet Take 1 tablet (20 mg total) by mouth daily.  . montelukast (SINGULAIR) 10 MG tablet Take 10 mg by mouth at bedtime.   . pantoprazole (PROTONIX) 40 MG tablet Take 40 mg by mouth daily.   . sacubitril-valsartan (ENTRESTO) 24-26 MG Take 1 tablet by mouth 2 (two) times daily.  Marland Kitchen spironolactone (ALDACTONE) 25 MG tablet Take 1 tablet (25 mg total) by mouth daily.  Marland Kitchen triamcinolone ointment (KENALOG) 0.1 % Apply 1 application topically 3 (three) times daily as needed (itching/eczema).     Allergies:   Lamisil [terbinafine]   Social History   Tobacco Use  . Smoking status: Former Smoker    Types: Cigarettes    Last attempt to quit: 2004    Years since quitting: 16.2  . Smokeless tobacco: Never Used  . Tobacco comment: Quit in 2004  Substance Use Topics  . Alcohol use: Yes    Alcohol/week: 1.0 standard drinks    Types: 1 Cans of beer per week    Frequency: Never    Comment: Every now and then  . Drug use: No     Family  Hx: The patient's family history includes Brain cancer in his mother.  ROS:   Please see the history of present illness.   All other systems reviewed and are negative.   Labs/Other Tests and Data Reviewed:    Recent Labs: 10/07/2018: ALT 23 02/12/2019: BUN 19; Creatinine, Ser 1.08; Potassium 3.5; Sodium 141   Recent Lipid Panel Lab Results  Component Value Date/Time   CHOL 153 10/07/2018 02:52 PM   TRIG 176 (H) 10/07/2018 02:52 PM   HDL 68 10/07/2018 02:52 PM   CHOLHDL 2.3 10/07/2018 02:52 PM   CHOLHDL 5.1 07/22/2018 11:07 AM   LDLCALC 50 10/07/2018 02:52 PM    Wt Readings from Last 3 Encounters:  03/19/19 176 lb (79.8 kg)  02/28/19 178 lb (80.7 kg)  01/10/19 180 lb (81.6 kg)     Exam:    Vital Signs:  Ht 5\' 10"  (1.778  m)   Wt 176 lb (79.8 kg)   BMI 25.25 kg/m     ASSESSMENT & PLAN:    Chronic systolic heart failure secondary to nonischemic cardiomyopathy: Mr. Bilyk continues to do well with NYHA class I symptoms.  His weight is stable or actually slightly down compared to prior readings.  He is tolerating his evidence-based heart failure regimen consisting of carvedilol, Entresto, and spironolactone well.  Given that we are unable to assess heart rate and blood pressure at this time, I will defer any medication changes.  Most recent BMP in 01/2019 showed normal renal function and potassium.  Nonobstructive coronary artery disease: No symptoms to suggest worsening coronary insufficiency.  Catheterization last year showed mild to moderate, nonobstructive disease.  Continue with aspirin and statin therapy, as long as recurrent epistaxis does not become a problem.  Mixed hyperlipidemia: LDL at goal on last check.  Triglycerides were mildly elevated.  Mr. Manlove asks today about the utility of taking fish oil.  I advised him that he could consider taking this, though given his mildly elevated triglycerides, there is no strong indication for this.  Eczema: This continues to  be Mr. Stfort greatest concern.  It sounds like he may not be administering his dupilumab injections correctly on a consistent basis.  I have advised him to speak with his dermatologist and or pharmacy regarding further teaching on appropriate administration.  COVID-19 Education: The signs and symptoms of COVID-19 were discussed with the patient and how to seek care for testing (follow up with PCP or arrange E-visit).  The importance of social distancing was discussed today.  Patient Risk:   After full review of this patients clinical status, I feel that they are at least moderate risk at this time.  Time:   Today, I have spent 13 minutes with the patient with telehealth technology discussing chronic systolic heart failure, hyperlipidemia, and COVID-19 precautions..     Medication Adjustments/Labs and Tests Ordered: Current medicines are reviewed at length with the patient today.  Concerns regarding medicines are outlined above.   Tests Ordered: None.  Medication Changes: None.  Disposition:  in 3 month(s); he will need a BMP around that time to ensure stable renal function and potassium in the setting of long-term spironolactone and Entresto use.  Signed, Nelva Bush, MD  03/19/2019 8:48 AM    [facility]

## 2019-03-19 ENCOUNTER — Telehealth (INDEPENDENT_AMBULATORY_CARE_PROVIDER_SITE_OTHER): Payer: Medicare HMO | Admitting: Internal Medicine

## 2019-03-19 ENCOUNTER — Other Ambulatory Visit: Payer: Self-pay

## 2019-03-19 ENCOUNTER — Encounter: Payer: Self-pay | Admitting: Internal Medicine

## 2019-03-19 VITALS — Ht 70.0 in | Wt 176.0 lb

## 2019-03-19 DIAGNOSIS — Z79899 Other long term (current) drug therapy: Secondary | ICD-10-CM

## 2019-03-19 DIAGNOSIS — I428 Other cardiomyopathies: Secondary | ICD-10-CM | POA: Diagnosis not present

## 2019-03-19 DIAGNOSIS — Z7982 Long term (current) use of aspirin: Secondary | ICD-10-CM

## 2019-03-19 DIAGNOSIS — I5022 Chronic systolic (congestive) heart failure: Secondary | ICD-10-CM

## 2019-03-19 DIAGNOSIS — I2510 Atherosclerotic heart disease of native coronary artery without angina pectoris: Secondary | ICD-10-CM

## 2019-03-19 DIAGNOSIS — E782 Mixed hyperlipidemia: Secondary | ICD-10-CM | POA: Diagnosis not present

## 2019-03-19 DIAGNOSIS — L309 Dermatitis, unspecified: Secondary | ICD-10-CM

## 2019-03-19 NOTE — Patient Instructions (Signed)
Medication Instructions:  Your physician recommends that you continue on your current medications as directed. Please refer to the Current Medication list given to you today.  If you need a refill on your cardiac medications before your next appointment, please call your pharmacy.   Lab work: none If you have labs (blood work) drawn today and your tests are completely normal, you will receive your results only by: Marland Kitchen MyChart Message (if you have MyChart) OR . A paper copy in the mail If you have any lab test that is abnormal or we need to change your treatment, we will call you to review the results.  Testing/Procedures: none  Follow-Up: At [facility], you and your health needs are our priority.  As part of our continuing mission to provide you with exceptional heart care, we have created designated Provider Care Teams.  These Care Teams include your primary Cardiologist (physician) and Advanced Practice Providers (APPs -  Physician Assistants and Nurse Practitioners) who all work together to provide you with the care you need, when you need it. You will need a follow up appointment in 3 months AS A VIRTUAL OR OFFICE.  Please call our office 2 months in advance to schedule this appointment.  You may see Nelva Bush, MD or one of the following Advanced Practice Providers on your designated Care Team:   Murray Hodgkins, NP Christell Faith, PA-C . Marrianne Mood, PA-C

## 2019-03-22 ENCOUNTER — Other Ambulatory Visit: Payer: Self-pay

## 2019-04-12 ENCOUNTER — Other Ambulatory Visit: Payer: Self-pay | Admitting: Family

## 2019-04-12 MED ORDER — FUROSEMIDE 20 MG PO TABS: 20.0000 mg | ORAL_TABLET | Freq: Every day | ORAL | 3 refills | Status: DC

## 2019-05-03 ENCOUNTER — Telehealth: Payer: Self-pay

## 2019-05-03 NOTE — Telephone Encounter (Signed)
   TELEPHONE CALL NOTE  This patient has been deemed a candidate for follow-up tele-health visit to limit community exposure during the Covid-19 pandemic. I spoke with the patient via phone to discuss instructions. The patient was advised to review the section on consent for treatment as well. The patient will receive a phone call 2-3 days prior to their E-Visit at which time consent will be verbally confirmed. A Virtual Office Visit appointment type has been scheduled for 05/07/2019 with [facility].  Gaylord Shih, CMA 05/03/2019 2:42 PM

## 2019-05-03 NOTE — Telephone Encounter (Signed)
TELEPHONE CALL NOTE  NERY FRAPPIER has been deemed a candidate for a follow-up tele-health visit to limit community exposure during the Covid-19 pandemic. I spoke with the patient via phone to ensure availability of phone/video source, confirm preferred email & phone number, discuss instructions and expectations, and review consent.   I reminded BOOKERT GUZZI to be prepared with any vital sign and/or heart rhythm information that could potentially be obtained via home monitoring, at the time of his visit.  Finally, I reminded GAREY ALLEVA to expect an e-mail containing a link for their video-based visit approximately 15 minutes before his visit, or alternatively, a phone call at the time of his visit if his visit is planned to be a phone encounter.  Did the patient verbally consent to treatment as below? YES  Gaylord Shih, CMA 05/03/2019 2:42 PM  CONSENT FOR TELE-HEALTH VISIT - PLEASE REVIEW  I hereby voluntarily request, consent and authorize [facility] and its employed or contracted physicians, physician assistants, nurse practitioners or other licensed health care professionals (the Practitioner), to provide me with telemedicine health care services (the "Services") as deemed necessary by the treating Practitioner. I acknowledge and consent to receive the Services by the Practitioner via telemedicine. I understand that the telemedicine visit will involve communicating with the Practitioner through telephonic communication technology and the disclosure of certain medical information by electronic transmission. I acknowledge that I have been given the opportunity to request an in-person assessment or other available alternative prior to the telemedicine visit and am voluntarily participating in the telemedicine visit.  I understand that I have the right to withhold or withdraw my consent to the use of telemedicine in the course of my care at any time, without affecting my  right to future care or treatment, and that the Practitioner or I may terminate the telemedicine visit at any time. I understand that I have the right to inspect all information obtained and/or recorded in the course of the telemedicine visit and may receive copies of available information for a reasonable fee.  I understand that some of the potential risks of receiving the Services via telemedicine include:  Marland Kitchen Delay or interruption in medical evaluation due to technological equipment failure or disruption; . Information transmitted may not be sufficient (e.g. poor resolution of images) to allow for appropriate medical decision making by the Practitioner; and/or  . In rare instances, security protocols could fail, causing a breach of personal health information.  Furthermore, I acknowledge that it is my responsibility to provide information about my medical history, conditions and care that is complete and accurate to the best of my ability. I acknowledge that Practitioner's advice, recommendations, and/or decision may be based on factors not within their control, such as incomplete or inaccurate data provided by me or lack of visual representation. I understand that the practice of medicine is not an exact science and that Practitioner makes no warranties or guarantees regarding treatment outcomes. I acknowledge that I will receive a copy of this consent concurrently upon execution via email to the email address I last provided but may also request a printed copy by calling the office of [facility].    I understand that my insurance may be billed for this visit.   I have read or had this consent read to me. . I understand the contents of this consent, which adequately explains the benefits and risks of the Services being provided via telemedicine.  Marland Kitchen  I have been provided ample opportunity to ask questions regarding this consent and the Services and have had my questions answered to my  satisfaction. . I give my informed consent for the services to be provided through the use of telemedicine in my medical care  By participating in this telemedicine visit I agree to the above.

## 2019-05-05 ENCOUNTER — Ambulatory Visit: Payer: Medicare PPO | Admitting: Family

## 2019-05-07 ENCOUNTER — Ambulatory Visit: Payer: Medicare HMO | Attending: Family | Admitting: Family

## 2019-05-07 ENCOUNTER — Other Ambulatory Visit: Payer: Self-pay

## 2019-05-07 ENCOUNTER — Encounter: Payer: Self-pay | Admitting: Family

## 2019-05-07 VITALS — Wt 173.0 lb

## 2019-05-07 DIAGNOSIS — I10 Essential (primary) hypertension: Secondary | ICD-10-CM

## 2019-05-07 DIAGNOSIS — I5022 Chronic systolic (congestive) heart failure: Secondary | ICD-10-CM

## 2019-05-07 MED ORDER — CARVEDILOL 6.25 MG PO TABS: 6.2500 mg | ORAL_TABLET | Freq: Two times a day (BID) | ORAL | 3 refills | Status: DC

## 2019-05-07 MED ORDER — ATORVASTATIN CALCIUM 20 MG PO TABS: 20.0000 mg | ORAL_TABLET | Freq: Every day | ORAL | 3 refills | Status: DC

## 2019-05-07 MED ORDER — SPIRONOLACTONE 25 MG PO TABS: 25.0000 mg | ORAL_TABLET | Freq: Every day | ORAL | 3 refills | Status: DC

## 2019-05-07 NOTE — Progress Notes (Signed)
Virtual Visit via Telephone Note    Evaluation Performed:  Follow-up visit  This visit type was conducted due to national recommendations for restrictions regarding the COVID-19 Pandemic (e.g. social distancing).  This format is felt to be most appropriate for this patient at this time.  All issues noted in this document were discussed and addressed.  No physical exam was performed (except for noted visual exam findings with Video Visits).  Please refer to the patient's chart (MyChart message for video visits and phone note for telephone visits) for the patient's consent to telehealth for [facility]  Date:  05/07/2019   ID:  Earl Hays, DOB 02-22-1941, MRN [id number]  Patient Location:  [address] [place] [address]   Provider location:   [facility] [address], [place] [address]  PCP:  Rusty Aus, MD  Cardiologist:  Nelva Bush, MD  Electrophysiologist:  None   Chief Complaint:  fatigue  History of Present Illness:    Earl Hays is a 78 y.o. male who presents via audio/video conferencing for a telehealth visit today.  Patient verified DOB and address.  The patient does not have symptoms concerning for COVID-19 infection (fever, chills, cough, or new SHORTNESS OF BREATH).   Patient reports minimal fatigue upon moderate exertion. He describes this as minimal in severity and chronic in nature having been present for several years. He has no other symptoms and specifically denies any dizziness, swelling in his legs/ abdomen, chest pain, shortness of breath, cough, difficulty sleeping or weight gain.   Prior CV studies:   The following studies were reviewed today:  Echo report from 04/29/18 reviewed and showed an EF of 30-35%  Past Medical History:  Diagnosis Date  . CHF (congestive heart failure) ([place])   . Coronary artery disease 12/2017   Non-obstructive by cath (12/31/2017)  . GERD (gastroesophageal reflux disease)    . Hypertension   . Varices, esophageal ([place])    Past Surgical History:  Procedure Laterality Date  . CARDIAC CATHETERIZATION    . CATARACT EXTRACTION W/PHACO Left 01/21/2017   Procedure: CATARACT EXTRACTION PHACO AND INTRAOCULAR LENS PLACEMENT (IOC);  Surgeon: Birder Robson, MD;  Location: [facility] ORS;  Service: Ophthalmology;  Laterality: Left;  [place] 00:48 AP% 21.6 CDE 10.39 Fluid pack lot # [id number]  . CATARACT EXTRACTION W/PHACO Right 02/11/2017   Procedure: CATARACT EXTRACTION PHACO AND INTRAOCULAR LENS PLACEMENT (IOC);  Surgeon: Birder Robson, MD;  Location: [facility] ORS;  Service: Ophthalmology;  Laterality: Right;  [place] 00:42 AP% 22.4 CDE 9.47 Fluid pack lot # [id number]  . COLONOSCOPY    . JOINT REPLACEMENT     TKR  . REPLACEMENT TOTAL KNEE BILATERAL Bilateral   . RIGHT/LEFT HEART CATH AND CORONARY ANGIOGRAPHY N/A 12/31/2017   Procedure: RIGHT/LEFT HEART CATH AND CORONARY ANGIOGRAPHY;  Surgeon: Nelva Bush, MD;  Location: [place] [facility];  Service: Cardiovascular;  Laterality: N/A;     Current Meds  Medication Sig  . aspirin EC 81 MG tablet Take 81 mg by mouth daily.  Marland Kitchen atorvastatin (LIPITOR) 20 MG tablet Take 1 tablet (20 mg total) by mouth daily at 6 PM.  . carvedilol (COREG) 6.25 MG tablet Take 1 tablet (6.25 mg total) by mouth 2 (two) times daily.  . Cyanocobalamin 3000 MCG SUBL Place 3,000 mcg under the tongue once a week.   . Dupilumab, Asthma, (DUPIXENT [place]) Inject 300 mg into the skin every 14 (fourteen) days.  . furosemide (  LASIX) 20 MG tablet Take 1 tablet (20 mg total) by mouth daily.  . montelukast (SINGULAIR) 10 MG tablet Take 10 mg by mouth at bedtime.   . pantoprazole (PROTONIX) 40 MG tablet Take 40 mg by mouth daily.   . sacubitril-valsartan (ENTRESTO) 24-26 MG Take 1 tablet by mouth 2 (two) times daily.  Marland Kitchen spironolactone (ALDACTONE) 25 MG tablet Take 25 mg by mouth daily.  Marland Kitchen triamcinolone ointment (KENALOG) 0.1 % Apply 1 application topically 3 (three)  times daily as needed (itching/eczema).     Allergies:   Lamisil [terbinafine]   Social History   Tobacco Use  . Smoking status: Former Smoker    Types: Cigarettes    Last attempt to quit: 2004    Years since quitting: 16.3  . Smokeless tobacco: Never Used  . Tobacco comment: Quit in 2004  Substance Use Topics  . Alcohol use: Yes    Alcohol/week: 1.0 standard drinks    Types: 1 Cans of beer per week    Frequency: Never    Comment: Every now and then  . Drug use: No     Family Hx: The patient's family history includes Brain cancer in his mother.  ROS:   Please see the history of present illness.     All other systems reviewed and are negative.   Labs/Other Tests and Data Reviewed:    Recent Labs: 10/07/2018: ALT 23 02/12/2019: BUN 19; Creatinine, Ser 1.08; Potassium 3.5; Sodium 141   Recent Lipid Panel Lab Results  Component Value Date/Time   CHOL 153 10/07/2018 02:52 PM   TRIG 176 (H) 10/07/2018 02:52 PM   HDL 68 10/07/2018 02:52 PM   CHOLHDL 2.3 10/07/2018 02:52 PM   CHOLHDL 5.1 07/22/2018 11:07 AM   LDLCALC 50 10/07/2018 02:52 PM    Wt Readings from Last 3 Encounters:  05/07/19 173 lb (78.5 kg)  03/19/19 176 lb (79.8 kg)  02/28/19 178 lb (80.7 kg)     Exam:    Vital Signs:  Wt 173 lb (78.5 kg) Comment: self-reported  BMI 24.82 kg/m    Well nourished, well developed male in no  acute distress.   ASSESSMENT & PLAN:    1. Chronic heart failure with reduced ejection fraction- - NYHA class II - euvolemic today based on patient's description of symptoms - weighing daily. Reminded to call for an overnight weight gain of >2 pounds or a weekly weight gain of >5 pounds - not adding salt to his food. Reminded to keep daily sodium intake to 2000mg  daily. Using Mrs. Dash & No salt says that his appetite is "great" - has been trying to remain active but admits that it's hard with the [employer] being closed due to COVID-19 - had telemedicine visit with cardiology  (End) 03/19/2019  2: HTN- - not checking his BP at home - BMP from 02/12/2019 reviewed and showed sodium 141, potassium 3.5, creatinine 1.08  and GFR >60 - saw PCP Sabra Heck) 03/02/2019  COVID-19 Education: The signs and symptoms of COVID-19 were discussed with the patient and how to seek care for testing (follow up with PCP or arrange E-visit).  The importance of social distancing was discussed today.  Patient Risk:   After full review of this patients clinical status, I feel that they are at least moderate risk at this time.  Time:   Today, I have spent 15 minutes with the patient with telehealth technology discussing medications, diet and symptoms to report.     Medication Adjustments/Labs and  Tests Ordered: Current medicines are reviewed at length with the patient today.  Concerns regarding medicines are outlined above.   Tests Ordered: No orders of the defined types were placed in this encounter.  Medication Changes: No orders of the defined types were placed in this encounter.   Disposition:  Follow-up in 2 months or sooner for any questions/problems before then.   Signed, Alisa Graff, FNP  05/07/2019 1:18 PM    [facility]

## 2019-05-07 NOTE — Patient Instructions (Signed)
Continue weighing daily and call for an overnight weight gain of > 2 pounds or a weekly weight gain of >5 pounds. 

## 2019-06-15 NOTE — Unmapped (Signed)
Call from patient on nurse line.  Requested some ointment to be ordered for his itching/eczema.

## 2019-06-18 MED ORDER — TRIAMCINOLONE ACETONIDE 0.1 % TOPICAL OINTMENT
6 refills | 0 days | Status: CP
Start: 2019-06-18 — End: ?

## 2019-06-22 NOTE — Unmapped (Signed)
I spoke with Donald Sanchez and he is asking if you could please contact him to discuss other medicine option. He said the medical  issue isn't better, it has gotten worst.

## 2019-06-24 ENCOUNTER — Telehealth: Payer: Self-pay | Admitting: Internal Medicine

## 2019-06-24 MED ORDER — CLOBETASOL 0.05 % TOPICAL OINTMENT
Freq: Two times a day (BID) | TOPICAL | 5 refills | 0 days | Status: CP
Start: 2019-06-24 — End: 2020-06-23

## 2019-06-24 NOTE — Telephone Encounter (Signed)

## 2019-06-24 NOTE — Unmapped (Signed)
I called and spoke with Donald Sanchez.  He let me know that he is continuing to have itch especially on his lower legs and forearms.  He continues to avoid Gold.  We discussed the possibility of doing patch testing with Dr. Dub Mikes, he says he had patch testing at [facility] dermatology previously and was said to be allergic to gold.  He got some benefit from UVB before and would like to restart that.  I put the order in.  He continues on the Dupixent.  He says that a friend of his used a topical that was very helpful for him and he is going to try to get me the name.

## 2019-06-28 ENCOUNTER — Ambulatory Visit (INDEPENDENT_AMBULATORY_CARE_PROVIDER_SITE_OTHER): Payer: Medicare HMO | Admitting: Internal Medicine

## 2019-06-28 ENCOUNTER — Encounter: Payer: Self-pay | Admitting: Internal Medicine

## 2019-06-28 ENCOUNTER — Other Ambulatory Visit: Payer: Self-pay

## 2019-06-28 VITALS — BP 110/70 | HR 70 | Ht 70.0 in | Wt 187.8 lb

## 2019-06-28 DIAGNOSIS — I2510 Atherosclerotic heart disease of native coronary artery without angina pectoris: Secondary | ICD-10-CM | POA: Diagnosis not present

## 2019-06-28 DIAGNOSIS — I5022 Chronic systolic (congestive) heart failure: Secondary | ICD-10-CM | POA: Diagnosis not present

## 2019-06-28 NOTE — Patient Instructions (Signed)
Medication Instructions:  Your physician recommends that you continue on your current medications as directed. Please refer to the Current Medication list given to you today.  If you need a refill on your cardiac medications before your next appointment, please call your pharmacy.   Lab work: - None ordered.  If you have labs (blood work) drawn today and your tests are completely normal, you will receive your results only by: Marland Kitchen MyChart Message (if you have MyChart) OR . A paper copy in the mail If you have any lab test that is abnormal or we need to change your treatment, we will call you to review the results.  Testing/Procedures: - None ordered.   Follow-Up: At [facility], you and your health needs are our priority.  As part of our continuing mission to provide you with exceptional heart care, we have created designated Provider Care Teams.  These Care Teams include your primary Cardiologist (physician) and Advanced Practice Providers (APPs -  Physician Assistants and Nurse Practitioners) who all work together to provide you with the care you need, when you need it. You will need a follow up appointment in 3 months.  Please call our office 2 months in advance to schedule this appointment.  You may see Nelva Bush, MD or one of the following Advanced Practice Providers on your designated Care Team:   Murray Hodgkins, NP Christell Faith, PA-C . Marrianne Mood, PA-C  Any Other Special Instructions Will Be Listed Below (If Applicable).  Please push appointment with Darylene Price back about 1 month since you saw Dr End today.

## 2019-06-28 NOTE — Progress Notes (Signed)
Follow-up Outpatient Visit Date: 06/28/2019  Primary Care Provider: Rusty Aus, MD [place] [address]  Chief Complaint: Follow-up chronic systolic heart failure.  HPI:  Earl Hays is a 78 y.o. 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  I last spoke with Earl Hays in late March, at which time he was doing well other than continued issues related to eczema.  He denied chest pain, shortness of breath, palpitations, lightheadedness, orthopnea, and edema.  He was tolerating his regimen of carvedilol, Entresto, and spironolactone well.  We did not make any medication changes at that time.  Today, Earl Hays reports feeling well other than continued rash and pruritus related to eczema.  He has seen 3 dermatologists without noticeable improvement.  He is contemplating referral to [facility].  From a heart standpoint, Earl Hays has been doing well, denying chest pain, shortness of breath, palpitations, lightheadedness, orthopnea, PND, and edema.  He has been less active over the last 3 months in the setting of the COVID-19 pandemic, as the [employer] has been closed.  He has therefore put on about 10 pounds.  He does not believe that he is retaining fluid.  --------------------------------------------------------------------------------------------------  Past Medical History:  Diagnosis Date  . CHF (congestive heart failure) ([place])   . Coronary artery disease 12/2017   Non-obstructive by cath (12/31/2017)  . GERD (gastroesophageal reflux disease)   . Hypertension   . Varices, esophageal ([place])    Past Surgical History:  Procedure Laterality Date  . CARDIAC CATHETERIZATION    . CATARACT EXTRACTION  W/PHACO Left 01/21/2017   Procedure: CATARACT EXTRACTION PHACO AND INTRAOCULAR LENS PLACEMENT (IOC);  Surgeon: Birder Robson, MD;  Location: [facility] ORS;  Service: Ophthalmology;  Laterality: Left;  [place] 00:48 AP% 21.6 CDE 10.39 Fluid pack lot # [id number]  . CATARACT EXTRACTION W/PHACO Right 02/11/2017   Procedure: CATARACT EXTRACTION PHACO AND INTRAOCULAR LENS PLACEMENT (IOC);  Surgeon: Birder Robson, MD;  Location: [facility] ORS;  Service: Ophthalmology;  Laterality: Right;  [place] 00:42 AP% 22.4 CDE 9.47 Fluid pack lot # [id number]  . COLONOSCOPY    . JOINT REPLACEMENT     TKR  . REPLACEMENT TOTAL KNEE BILATERAL Bilateral   . RIGHT/LEFT HEART CATH AND CORONARY ANGIOGRAPHY N/A 12/31/2017   Procedure: RIGHT/LEFT HEART CATH AND CORONARY ANGIOGRAPHY;  Surgeon: Nelva Bush, MD;  Location: [place] [facility];  Service: Cardiovascular;  Laterality: N/A;    Current Meds  Medication Sig  . aspirin EC 81 MG tablet Take 81 mg by mouth daily.  Marland Kitchen atorvastatin (LIPITOR) 20 MG tablet Take 1 tablet (20 mg total) by mouth daily at 6 PM.  . carvedilol (COREG) 6.25 MG tablet Take 1 tablet (6.25 mg total) by mouth 2 (two) times daily.  . Cyanocobalamin 3000 MCG SUBL Place 3,000 mcg under the tongue once a week.   . Dupilumab, Asthma, (DUPIXENT [place]) Inject 300 mg into the skin every 14 (fourteen) days.  . furosemide (LASIX) 20 MG tablet Take 1 tablet (20 mg total) by mouth daily.  . montelukast (SINGULAIR) 10 MG tablet Take 10 mg by mouth at bedtime.   . pantoprazole (PROTONIX) 40 MG tablet Take 40 mg by mouth daily.   . sacubitril-valsartan (ENTRESTO) 24-26 MG Take 1 tablet by mouth 2 (two) times  daily.  . spironolactone (ALDACTONE) 25 MG tablet Take 1 tablet (25 mg total) by mouth daily.  Marland Kitchen triamcinolone ointment (KENALOG) 0.1 % Apply 1 application topically 3 (three) times daily as needed (itching/eczema).    Allergies: Lamisil [terbinafine]  Social History   Tobacco Use  . Smoking status: Former  Smoker    Types: Cigarettes    Quit date: 2004    Years since quitting: 16.5  . Smokeless tobacco: Never Used  . Tobacco comment: Quit in 2004  Substance Use Topics  . Alcohol use: Yes    Alcohol/week: 1.0 standard drinks    Types: 1 Cans of beer per week    Frequency: Never    Comment: Every now and then  . Drug use: No    Family History  Problem Relation Age of Onset  . Brain cancer Mother     Review of Systems: A 12-system review of systems was performed and was negative except as noted in the HPI.  --------------------------------------------------------------------------------------------------  Physical Exam: BP 110/70 (BP Location: Left Arm, Patient Position: Sitting, Cuff Size: Normal)   Pulse 70   Ht 5\' 10"  (1.778 m)   Wt 187 lb 12 oz (85.2 kg)   BMI 26.94 kg/m   General:  NAD HEENT: No conjunctival pallor or scleral icterus. Face mask in place. Neck: Supple without lymphadenopathy, thyromegaly, JVD, or HJR. Lungs: Normal work of breathing. Clear to auscultation bilaterally without wheezes or crackles. Heart: Regular rate and rhythm without murmurs, rubs, or gallops. Non-displaced PMI. Abd: Bowel sounds present. Soft, NT/ND without hepatosplenomegaly Ext: No lower extremity edema. Radial, PT, and DP pulses are 2+ bilaterally. Skin: Warm and dry.  Scaly rash with hyperpigmentation noted on both upper and lower extremities.  EKG: Normal sinus rhythm with isolated PAC.  Otherwise, no significant abnormality.  Lab Results  Component Value Date   WBC 7.4 12/31/2017   HGB 13.9 12/31/2017   HCT 41.3 12/31/2017   MCV 91.8 12/31/2017   PLT 136 (L) 12/31/2017    Lab Results  Component Value Date   NA 141 02/12/2019   K 3.5 02/12/2019   CL 105 02/12/2019   CO2 30 02/12/2019   BUN 19 02/12/2019   CREATININE 1.08 02/12/2019   GLUCOSE 100 (H) 02/12/2019   ALT 23 10/07/2018    Lab Results  Component Value Date   CHOL 153 10/07/2018   HDL 68 10/07/2018    LDLCALC 50 10/07/2018   TRIG 176 (H) 10/07/2018   CHOLHDL 2.3 10/07/2018    --------------------------------------------------------------------------------------------------  ASSESSMENT AND PLAN: Chronic systolic heart failure secondary to nonischemic cardiomyopathy: Earl Hays continues to feel well with NYHA class I heart failure symptoms.  He appears euvolemic today despite gaining 11 pounds since March.  We will defer escalation of his evidence-based heart failure therapy today given borderline low blood pressure.  Most recent labs earlier this month through Dr. Ammie Ferrier office showed slight elevation in creatinine, which may represent mild dehydration.  I encouraged Earl Hays to drink a little bit more water.  Nonobstructive coronary artery disease: No symptoms to suggest worsening coronary insufficiency.  Continue current medications.  Follow-up: Return to clinic in 3 months.  Nelva Bush, MD 06/28/2019 4:06 PM

## 2019-07-08 NOTE — Progress Notes (Signed)
Patient ID: Earl Hays, male    DOB: 1941-04-08, 78 y.o.   MRN: [id number]  HPI  Earl Hays is a 78 y/o male with a history of GERD, HTN, eczema, previous tobacco use and chronic heart failure.   Echo report done 04/29/18 reviewed and showed an EF of 30-35%. Echo report done on 12/30/17 reviewed and showed an EF of 30-35% along with mild/mod Earl and moderately increased PA pressure of 45 mm Hg.   Cardiac catheterization done 12/31/17 showed mild-moderate non-obstructive CAD along with elevated left and right pressures.  Was in the ED 01/11/2019 due to nose bleed where he was treated and released.   He presents today with a chief complaint of a follow-up visit. He doesn't have any associated symptoms and specifically denies any difficulty sleeping, dizziness, abdominal distention, palpitations, pedal edema, chest pain, shortness of breath, cough or fatigue. Has noticed a slight weight gain over the last 4 months due to the gym being closed.   Past Medical History:  Diagnosis Date  . CHF (congestive heart failure) ([place])   . Coronary artery disease 12/2017   Non-obstructive by cath (12/31/2017)  . GERD (gastroesophageal reflux disease)   . Hypertension   . Varices, esophageal ([place])    Past Surgical History:  Procedure Laterality Date  . CARDIAC CATHETERIZATION    . CATARACT EXTRACTION W/PHACO Left 01/21/2017   Procedure: CATARACT EXTRACTION PHACO AND INTRAOCULAR LENS PLACEMENT (IOC);  Surgeon: Birder Robson, MD;  Location: [facility] ORS;  Service: Ophthalmology;  Laterality: Left;  [place] 00:48 AP% 21.6 CDE 10.39 Fluid pack lot # [id number]  . CATARACT EXTRACTION W/PHACO Right 02/11/2017   Procedure: CATARACT EXTRACTION PHACO AND INTRAOCULAR LENS PLACEMENT (IOC);  Surgeon: Birder Robson, MD;  Location: [facility] ORS;  Service: Ophthalmology;  Laterality: Right;  [place] 00:42 AP% 22.4 CDE 9.47 Fluid pack lot # [id number]  . COLONOSCOPY    . JOINT REPLACEMENT     TKR  . REPLACEMENT TOTAL KNEE BILATERAL  Bilateral   . RIGHT/LEFT HEART CATH AND CORONARY ANGIOGRAPHY N/A 12/31/2017   Procedure: RIGHT/LEFT HEART CATH AND CORONARY ANGIOGRAPHY;  Surgeon: Nelva Bush, MD;  Location: [place] [facility];  Service: Cardiovascular;  Laterality: N/A;   Family History  Problem Relation Age of Onset  . Brain cancer Mother    Social History   Tobacco Use  . Smoking status: Former Smoker    Types: Cigarettes    Quit date: 2004    Years since quitting: 16.5  . Smokeless tobacco: Never Used  . Tobacco comment: Quit in 2004  Substance Use Topics  . Alcohol use: Yes    Alcohol/week: 1.0 standard drinks    Types: 1 Cans of beer per week    Frequency: Never    Comment: Every now and then   Allergies  Allergen Reactions  . Lamisil [Terbinafine] Itching and Rash   Prior to Admission medications   Medication Sig Start Date End Date Taking? Authorizing Provider  aspirin EC 81 MG tablet Take 81 mg by mouth daily.   Yes [provider]  atorvastatin (LIPITOR) 20 MG tablet Take 1 tablet (20 mg total) by mouth daily at 6 PM. 05/07/19 08/05/19 Yes Danie Diehl, Otila Kluver A, FNP  carvedilol (COREG) 6.25 MG tablet Take 1 tablet (6.25 mg total) by mouth 2 (two) times daily. 05/07/19  Yes Kaylei Frink, Otila Kluver A, FNP  COD LIVER OIL PO Take by mouth daily.   Yes [provider]  Cyanocobalamin 3000 MCG SUBL Place 3,000 mcg under  the tongue once a week.    Yes [provider]  Dupilumab, Asthma, (DUPIXENT [place]) Inject 300 mg into the skin every 14 (fourteen) days.   Yes [provider]  furosemide (LASIX) 20 MG tablet Take 1 tablet (20 mg total) by mouth daily. 04/12/19  Yes Alisa Graff, FNP  Omega-3 Fatty Acids (FISH OIL) 1000 MG CAPS Take by mouth daily.   Yes [provider]  pantoprazole (PROTONIX) 40 MG tablet Take 40 mg by mouth daily.    Yes [provider]  sacubitril-valsartan (ENTRESTO) 24-26 MG Take 1 tablet by mouth 2 (two) times daily.   Yes [provider]  spironolactone (ALDACTONE) 25 MG tablet Take 1 tablet (25 mg total) by mouth daily. 05/07/19  Yes Darylene Price A, FNP  triamcinolone ointment (KENALOG) 0.1 % Apply 1 application topically 3 (three) times daily as needed (itching/eczema).   Yes [provider]    Review of Systems  Constitutional: Negative for appetite change and fatigue.  HENT: Negative for congestion, postnasal drip and sore throat.   Eyes: Negative.   Respiratory: Negative for cough, chest tightness and shortness of breath.   Cardiovascular: Negative for chest pain, palpitations and leg swelling.  Gastrointestinal: Negative for abdominal distention and abdominal pain.  Endocrine: Negative.   Genitourinary: Negative.   Musculoskeletal: Negative for back pain and neck pain.  Skin: Negative.   Allergic/Immunologic: Negative.   Neurological: Negative for dizziness and light-headedness.  Hematological: Negative for adenopathy. Does not bruise/bleed easily.  Psychiatric/Behavioral: Negative for dysphoric mood and sleep disturbance. The patient is not nervous/anxious.    Vitals:   07/09/19 1338  BP: 110/69  Pulse: 86  Resp: 18  SpO2: 100%  Weight: 187 lb (84.8 kg)  Height: 5\' 10"  (1.778 m)   Wt Readings from Last 3 Encounters:  07/09/19 187 lb (84.8 kg)  06/28/19 187 lb 12 oz (85.2 kg)  05/07/19 173 lb (78.5 kg)   Lab Results  Component Value Date   CREATININE 1.08 02/12/2019   CREATININE 1.15 01/13/2019   CREATININE 0.95 01/06/2019    Physical Exam  Constitutional: He is oriented to person, place, and time. He appears well-developed and well-nourished.  HENT:  Head: Normocephalic and atraumatic.  Neck: Normal range of motion. Neck supple. No JVD present.  Cardiovascular: Normal rate and regular rhythm.  Pulmonary/Chest: Effort normal. He has no wheezes. He has no rales.  Abdominal: He exhibits no distension. There is no abdominal tenderness.  Musculoskeletal:        General: No tenderness  or edema.  Neurological: He is alert and oriented to person, place, and time.  Skin: Skin is warm and dry.  Psychiatric: He has a normal mood and affect. His behavior is normal. Thought content normal.  Nursing note and vitals reviewed.  Assessment & Plan:  1: Chronic heart failure with reduced ejection fraction- - NYHA class I - euvolemic today - weighing daily. Reminded to call for an overnight weight gain of >2 pounds or a weekly weight gain of >5 pounds - weight up 4 pounds from last visit 7 months ago - not adding salt to his food. Reminded to keep daily sodium intake to 2000mg  daily. Using Mrs. Dash & No salt; says that his appetite is "great" - trying to remain active but admits that it's hard because the gyms are currently closed due to COVID-19; golfs a couple of times/ week - due to patient being symptomatic and his BP being on the low  side, will not titrate medications up today - saw cardiology (End) 06/28/2019  2: HTN- - BP looks good today although on the low side - BMP from 06/23/2019 reviewed and showed sodium 144, potassium 4.0, creatinine 1.4  and GFR 60 - saw PCP Sabra Heck) 06/30/2019  Reviewed medication bottles.  Return in 6 months or sooner for any questions/problems before then.

## 2019-07-09 ENCOUNTER — Other Ambulatory Visit: Payer: Self-pay

## 2019-07-09 ENCOUNTER — Encounter: Payer: Self-pay | Admitting: Family

## 2019-07-09 ENCOUNTER — Ambulatory Visit: Payer: Medicare HMO | Attending: Family | Admitting: Family

## 2019-07-09 VITALS — BP 110/69 | HR 86 | Resp 18 | Ht 70.0 in | Wt 187.0 lb

## 2019-07-09 DIAGNOSIS — Z883 Allergy status to other anti-infective agents status: Secondary | ICD-10-CM | POA: Diagnosis not present

## 2019-07-09 DIAGNOSIS — I2510 Atherosclerotic heart disease of native coronary artery without angina pectoris: Secondary | ICD-10-CM | POA: Diagnosis not present

## 2019-07-09 DIAGNOSIS — Z79899 Other long term (current) drug therapy: Secondary | ICD-10-CM | POA: Diagnosis not present

## 2019-07-09 DIAGNOSIS — Z7982 Long term (current) use of aspirin: Secondary | ICD-10-CM | POA: Insufficient documentation

## 2019-07-09 DIAGNOSIS — Z96653 Presence of artificial knee joint, bilateral: Secondary | ICD-10-CM | POA: Diagnosis not present

## 2019-07-09 DIAGNOSIS — I10 Essential (primary) hypertension: Secondary | ICD-10-CM

## 2019-07-09 DIAGNOSIS — Z87891 Personal history of nicotine dependence: Secondary | ICD-10-CM | POA: Diagnosis not present

## 2019-07-09 DIAGNOSIS — I110 Hypertensive heart disease with heart failure: Secondary | ICD-10-CM | POA: Insufficient documentation

## 2019-07-09 DIAGNOSIS — K219 Gastro-esophageal reflux disease without esophagitis: Secondary | ICD-10-CM | POA: Diagnosis not present

## 2019-07-09 DIAGNOSIS — I5022 Chronic systolic (congestive) heart failure: Secondary | ICD-10-CM | POA: Diagnosis not present

## 2019-07-09 DIAGNOSIS — I509 Heart failure, unspecified: Secondary | ICD-10-CM | POA: Diagnosis present

## 2019-07-09 NOTE — Patient Instructions (Signed)
Continue weighing daily and call for an overnight weight gain of > 2 pounds or a weekly weight gain of >5 pounds. 

## 2019-07-27 ENCOUNTER — Encounter: Admit: 2019-07-27 | Discharge: 2019-07-28 | Payer: BLUE CROSS/BLUE SHIELD

## 2019-07-27 DIAGNOSIS — L309 Dermatitis, unspecified: Principal | ICD-10-CM

## 2019-07-27 DIAGNOSIS — R21 Rash and other nonspecific skin eruption: Secondary | ICD-10-CM

## 2019-07-27 MED ORDER — TRIAMCINOLONE ACETONIDE 0.1 % TOPICAL OINTMENT
6 refills | 0 days | Status: CP
Start: 2019-07-27 — End: ?

## 2019-07-27 MED ORDER — CLOBETASOL 0.05 % TOPICAL OINTMENT
Freq: Two times a day (BID) | TOPICAL | 5 refills | 0 days | Status: CP
Start: 2019-07-27 — End: ?

## 2019-07-27 NOTE — Unmapped (Signed)
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 Discussed that he should continue Dupilumab for now. Reviewed potential next steps including repeat biopsy today, adding back phototherapy, and/or patch testing at [facility] with Dr. Amada Kingfisher (messaged financial counselor about cost for patch testing). He was agreeable to repeating biopsy today. Will call patient once results back.  - Continue Dupilumab 300 mg/2 mL syringe injection: inject 1 syringe every other week.  - Start Clobetasol 0.05% ointment BID PRN (new prescription sent to see if this is now covered by his new insurance); otherwise, continue Triamcinolone 0.1% ointment BID PRN.  - Recommending discussing with cardiologist about substituting or discontinuing Carvedilol in case it is contributing to rash.      Return to clinic: Return in 4 months (on 11/26/2019) for eczematous dermatitis. Or sooner based on biopsy results.    CHIEF COMPLAINT:  Rash f/u    HPI:   This is a pleasant 78 y.o.-year-old who last saw me on 01/13/2019.  We had been seeing him for an eczematous dermatitis.  He has been taking dupilumab and had gotten previously some benefit from that.  Prior treatments have also included methotrexate, topical steroids, and phototherapy.  He has a number of significant medical comorbidities including heart failure.  At last visit he was doing very well in terms of his eczematous rash. Today reports he continues to have itching and dark patches on his legs, arms, and back. He continues with Dupilumab but has not been using Triamcinolone ointment as he feels it has not been helpful. No eye irritation, conjunctivitis, or injection site side effects.    PAST MEDICAL HISTORY:  Eczema  Acute on chronic systolic CHF, cardiomyopathy  Reactive airway disease  HTN  ??  No personal history of skin cancer  No diabetes    Eczema specific history:  -Was previously seen at [facility] dermatology where per report he had biopsies consistent with eczema and also underwent patch testing which showed that he was allergic to gold.  Biopsy from March 2018 is scanned into our media tab.  -Prednisone has previously been effective for controlling his eczema but this is relatively contraindicated given his congestive heart failure  -He got minimal benefit from methotrexate and this was discontinued  -We have also previously treated him empirically for scabies with ivermectin and permethrin  -He believes phototherapy has been the most helpful treatment thus far    MEDICATIONS:      Current Outpatient Medications on File Prior to Visit   Medication Sig Dispense Refill   ??? aspirin (ECOTRIN) 81 MG tablet Take 81 mg by mouth.     ??? atorvastatin (LIPITOR) 20 MG tablet Take 20 mg by mouth.     ??? betamethasone dipropionate (DIPROLENE) 0.05 % ointment Apply topically twice daily to affected areas as needed. 90 g 6   ??? carvedilol (COREG) 6.25 MG tablet Take 6.25 mg by mouth.     ??? clobetasoL (TEMOVATE) 0.05 % ointment Apply topically Two (2) times a day. 60 g 5   ??? dupilumab (DUPIXENT) 300 mg/2 mL Syrg injection INJECT 1 SYRINGE EVERY OTHER WEEK FOR MAINTENANCE 4 mL PRN   ??? folic acid (FOLVITE) 1 MG tablet Take 1 pill daily on NON-methotrexate days 100 tablet 3   ??? furosemide (LASIX) 20 MG  tablet      ??? furosemide (LASIX) 40 MG tablet Take 40 mg by mouth.     ??? ivermectin (STROMECTOL) 3 mg Tab Take 5 tablets once. Repeat in 1 week. Wash all clothing and bedding in hot water. 10 tablet 0   ??? losartan (COZAAR) 25 MG tablet Take 12.5 mg by mouth.     ??? montelukast (SINGULAIR) 10 mg tablet      ??? pantoprazole (PROTONIX) 40 MG tablet Take 40 mg by mouth.     ??? permethrin (ELIMITE) 5 % cream Thoroughly massage cream from head to soles; leave on for 10 hours before washing. Wash all linens and bedding in hot water. 60 g 1   ??? sacubitril-valsartan (ENTRESTO) 24-26 mg tablet Take 1 tablet by mouth.     ??? spironolactone (ALDACTONE) 25 MG tablet Take 25 mg by mouth.     ??? triamcinolone (KENALOG) 0.1 % ointment Apply topically for itch and rash 2-3 times daily. 80 g 6     No current facility-administered medications on file prior to visit.        ALLERGIES:   Terbinafine hcl    SOCIAL HISTORY:  [place]ives locally  Enjoys golf    FAMILY HISTORY:  Reviewed in Epic    REVIEW OF SYSTEMS:  Baseline state of health. No recent illnesses. No other skin complaints.    PHYSICAL EXAMINATION:  Examination in the presence of male chaperone:  General: Well-developed, well-nourished. No acute distress. Neuro: Alert and oriented, answers questions appropriately.  Skin: Examination of the scalp, face, neck, chest, abdomen, back, bilateral upper extremities, bilateral lower extremities, palms, nails, and soles was performed and notable for the following:  - Many hyperpigmented patches over B/L anterior lower legs, B/L forearms and upper arms, lower back, and chest around the axillae, a few of which are lichenified. These are significantly less erythematous and scaly compared to before.  - All other areas not specifically commented on are within normal limits.    The patient was seen and examined by Abbie Sons, MD who agrees with the assessment and plan as above.

## 2019-07-27 NOTE — Unmapped (Addendum)
Continue the Dupixent as there has been some improvement in your rash. Currently, the rash that is present is primarily post-inflammatory hyperpigmentation (dark spots that develop in areas of prior inflammation); it will take time for your skin to return to the original color.    Start Clobetasol ointment twice a day as needed to affected areas. Stop when smooth/clear.    Please talk to your cardiologist about Carvedilol to see if this can be substituted with an alternative medication or discontinued, in case it is contributing to your rash.

## 2019-07-30 NOTE — Unmapped (Signed)
Reviewed pathology and discussed results with patient by phone/VM.    Right upper arm, punch  -Chronic spongiotic dermatitis.  See comment.    Expected finding similar to prior biopsies. Will continue with current plan of Dupixent and topical CS for now +/- Patch testing if able.    Tracking: No  RV: 4 months as planned      Theora Gianotti, MD  PGY-3 Resident Physician  [facility] Department of Dermatology

## 2019-09-20 NOTE — Unmapped (Signed)
-----   Message from Dianna Rossetti, LPN sent at 1/61/0960  4:19 PM EDT -----  Regarding: needs stitch removal appointment  Contact: [phone]

## 2019-09-27 ENCOUNTER — Ambulatory Visit: Admit: 2019-09-27 | Discharge: 2019-09-28 | Payer: BLUE CROSS/BLUE SHIELD

## 2019-09-27 DIAGNOSIS — L309 Dermatitis, unspecified: Secondary | ICD-10-CM

## 2019-09-27 NOTE — Unmapped (Signed)
???   The patient is here today for suture removal. Biopsy was performed on 07/27/19 . Biopsy location: right upper arm.  ???  Patient has no complaints.  ??? Physical Exam: Surgery site healing appropriately with no signs of infection, bleeding, or dehiscence.  ??? Sutures removed without complication; Wound edges remained well approximated after suture removal. Patient to return to the clinic: prn or as scheduled. Continue routine wound care and monitor wound for signs of infection. Patient to call with any questions or concerns.

## 2019-10-15 ENCOUNTER — Other Ambulatory Visit
Admission: RE | Admit: 2019-10-15 | Discharge: 2019-10-15 | Disposition: A | Discharge status: Home / Self Care | Payer: Medicare HMO | Source: Ambulatory Visit | Attending: Internal Medicine | Admitting: Internal Medicine

## 2019-10-15 DIAGNOSIS — Z01812 Encounter for preprocedural laboratory examination: Secondary | ICD-10-CM | POA: Diagnosis present

## 2019-10-15 DIAGNOSIS — Z20828 Contact with and (suspected) exposure to other viral communicable diseases: Secondary | ICD-10-CM | POA: Insufficient documentation

## 2019-10-15 LAB — SARS CORONAVIRUS 2 (TAT 6-24 HRS): SARS Coronavirus 2: NEGATIVE

## 2019-10-19 ENCOUNTER — Encounter: Payer: Self-pay | Admitting: *Deleted

## 2019-10-20 ENCOUNTER — Other Ambulatory Visit: Payer: Self-pay

## 2019-10-20 ENCOUNTER — Encounter: Payer: Self-pay | Admitting: Anesthesiology

## 2019-10-20 ENCOUNTER — Encounter
Admission: RE | Disposition: A | Discharge status: Home / Self Care | Payer: Self-pay | Source: Home / Self Care | Attending: Internal Medicine

## 2019-10-20 ENCOUNTER — Encounter: Payer: Self-pay | Admitting: *Deleted

## 2019-10-20 ENCOUNTER — Ambulatory Visit
Admission: RE | Admit: 2019-10-20 | Discharge: 2019-10-20 | Disposition: A | Discharge status: Home / Self Care | Payer: Medicare HMO | Attending: Internal Medicine | Admitting: Internal Medicine

## 2019-10-20 HISTORY — DX: Dermatitis, unspecified: L30.9

## 2019-10-20 HISTORY — DX: Hyperlipidemia, unspecified: E78.5

## 2019-10-20 HISTORY — DX: Benign neoplasm of colon, unspecified: D12.6

## 2019-10-20 SURGERY — COLONOSCOPY WITH PROPOFOL
Anesthesia: General

## 2019-10-20 MED ORDER — SODIUM CHLORIDE 0.9 % IV SOLN: INTRAVENOUS | Status: DC

## 2019-10-20 NOTE — Progress Notes (Signed)
Patient not clean wants to reschedule

## 2019-10-21 ENCOUNTER — Encounter: Payer: Self-pay | Admitting: Internal Medicine

## 2019-10-21 ENCOUNTER — Ambulatory Visit (INDEPENDENT_AMBULATORY_CARE_PROVIDER_SITE_OTHER): Payer: Medicare HMO | Admitting: Internal Medicine

## 2019-10-21 VITALS — BP 110/60 | HR 81 | Ht 70.0 in | Wt 189.5 lb

## 2019-10-21 DIAGNOSIS — I2510 Atherosclerotic heart disease of native coronary artery without angina pectoris: Secondary | ICD-10-CM

## 2019-10-21 DIAGNOSIS — I5042 Chronic combined systolic (congestive) and diastolic (congestive) heart failure: Secondary | ICD-10-CM

## 2019-10-21 DIAGNOSIS — I5022 Chronic systolic (congestive) heart failure: Secondary | ICD-10-CM | POA: Diagnosis not present

## 2019-10-21 MED ORDER — FUROSEMIDE 40 MG PO TABS: 40.0000 mg | ORAL_TABLET | Freq: Every day | ORAL | 2 refills | Status: DC

## 2019-10-21 NOTE — Patient Instructions (Signed)
Medication Instructions:  Your physician has recommended you make the following change in your medication:  1- INCREASE Furosemide to 40 mg by mouth once a day.  *If you need a refill on your cardiac medications before your next appointment, please call your pharmacy*  Lab Work: Your physician recommends that you return for lab work in: [place].   If you have labs (blood work) drawn today and your tests are completely normal, you will receive your results only by: Marland Kitchen MyChart Message (if you have MyChart) OR . A paper copy in the mail If you have any lab test that is abnormal or we need to change your treatment, we will call you to review the results.  Testing/Procedures: NONE  Follow-Up: At [facility], you and your health needs are our priority.  As part of our continuing mission to provide you with exceptional heart care, we have created designated Provider Care Teams.  These Care Teams include your primary Cardiologist (physician) and Advanced Practice Providers (APPs -  Physician Assistants and Nurse Practitioners) who all work together to provide you with the care you need, when you need it.  Your next appointment:   2-3 weeks with Gerald Stabs or Thurmond Butts.  The format for your next appointment:   In Person  Provider:   Murray Hodgkins, NP or Christell Faith, PA

## 2019-10-21 NOTE — Progress Notes (Signed)
Follow-up Outpatient Visit Date: 10/21/2019  Primary Care Provider: Rusty Aus, MD [place] [address]  Chief Complaint: Follow-up heart failure  HPI:  Earl Hays is a 78 y.o. 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  I last saw Earl Hays in July, at which time he was doing well from a heart standpoint.  He continued to struggle with eczema in spite of having seen multiple dermatologists.  No medication changes or additional testing were pursued at that time.  Today, Earl Hays is concerned about some abdominal bloating.  He was scheduled for colonoscopy yesterday but was unable to clear his colon in spite of using the entire bowel prep.  He has felt more bloated over the last few days, even preceding his bowel prep.  He thinks that he has put on some weight, though his weight today is only up 2 pounds.  He denies leg edema.  He has mild shortness of breath, which is new since our last visit in July.  He denies chest pain and palpitations.  He has stable two-pillow orthopnea.  He has been compliant with his medications.  --------------------------------------------------------------------------------------------------  Past Medical History:  Diagnosis Date  . CHF (congestive heart failure) ([place])   . Colon adenoma   . Coronary artery disease 12/2017   Non-obstructive by cath (12/31/2017)  . Eczema   . GERD (gastroesophageal reflux disease)   . Hyperlipidemia   . Hypertension   . Varices, esophageal ([place])    Past Surgical History:  Procedure Laterality Date  . CARDIAC CATHETERIZATION    . CATARACT EXTRACTION W/PHACO Left 01/21/2017   Procedure: CATARACT  EXTRACTION PHACO AND INTRAOCULAR LENS PLACEMENT (IOC);  Surgeon: Birder Robson, MD;  Location: [facility] ORS;  Service: Ophthalmology;  Laterality: Left;  [place] 00:48 AP% 21.6 CDE 10.39 Fluid pack lot # [id number]  . CATARACT EXTRACTION W/PHACO Right 02/11/2017   Procedure: CATARACT EXTRACTION PHACO AND INTRAOCULAR LENS PLACEMENT (IOC);  Surgeon: Birder Robson, MD;  Location: [facility] ORS;  Service: Ophthalmology;  Laterality: Right;  [place] 00:42 AP% 22.4 CDE 9.47 Fluid pack lot # [id number]  . COLONOSCOPY    . ESOPHAGOGASTRODUODENOSCOPY    . HERNIA REPAIR    . JOINT REPLACEMENT     TKR  . PROSTATE SURGERY    . REPLACEMENT TOTAL KNEE BILATERAL Bilateral   . RIGHT/LEFT HEART CATH AND CORONARY ANGIOGRAPHY N/A 12/31/2017   Procedure: RIGHT/LEFT HEART CATH AND CORONARY ANGIOGRAPHY;  Surgeon: Nelva Bush, MD;  Location: [facility];  Service: Cardiovascular;  Laterality: N/A;    Current Meds  Medication Sig  . aspirin EC 81 MG tablet Take 81 mg by mouth daily.  Marland Kitchen atorvastatin (LIPITOR) 20 MG tablet Take 1 tablet (20 mg total) by mouth daily at 6 PM.  . carvedilol (COREG) 6.25 MG tablet Take 1 tablet (6.25 mg total) by mouth 2 (two) times daily.  . COD LIVER OIL PO Take by mouth daily.  . Cyanocobalamin 3000 MCG SUBL Place 3,000 mcg under the tongue once a week.   . Docosahexaenoic Acid 100 MG CAPS Take by mouth daily.   . Dupilumab, Asthma, (DUPIXENT [place]) Inject 300 mg into the skin every 14 (fourteen) days.  . Omega-3 Fatty Acids (FISH OIL) 1000 MG CAPS Take by mouth  daily.  . pantoprazole (PROTONIX) 40 MG tablet Take 40 mg by mouth daily.   . sacubitril-valsartan (ENTRESTO) 24-26 MG Take 1 tablet by mouth 2 (two) times daily.  Marland Kitchen spironolactone (ALDACTONE) 25 MG tablet Take 1 tablet (25 mg total) by mouth daily.    Allergies: Lamisil [terbinafine]  Social History   Tobacco Use  . Smoking status: Former Smoker    Types: Cigarettes    Quit date: 2004    Years since quitting: 16.8   . Smokeless tobacco: Never Used  . Tobacco comment: Quit in 2004  Substance Use Topics  . Alcohol use: Yes    Alcohol/week: 1.0 standard drinks    Types: 1 Cans of beer per week    Frequency: Never    Comment: occ  . Drug use: No    Family History  Problem Relation Age of Onset  . Brain cancer Mother     Review of Systems: Pruritus, particularly on arms, persists.  Otherwise, a 12-system review of systems was performed and was negative except as noted in the HPI.  --------------------------------------------------------------------------------------------------  Physical Exam: BP 110/60 (BP Location: Left Arm, Patient Position: Sitting, Cuff Size: Normal)   Pulse 81   Ht 5\' 10"  (1.778 m)   Wt 189 lb 8 oz (86 kg)   SpO2 98%   BMI 27.19 kg/m   General: NAD. HEENT: No conjunctival pallor or scleral icterus. Neck: Supple without lymphadenopathy, thyromegaly, JVD, or HJR. Lungs: Normal work of breathing. Clear to auscultation bilaterally without wheezes or crackles. Heart: Regular rate and rhythm without murmurs, rubs, or gallops. Non-displaced PMI. Abd: Bowel sounds present.  Soft but mildly distended.  No tenderness. Ext: No lower extremity edema. Skin: Warm and dry without rash.  EKG: Normal sinus rhythm with PACs and nonspecific T wave changes.  Lab Results  Component Value Date   WBC 7.4 12/31/2017   HGB 13.9 12/31/2017   HCT 41.3 12/31/2017   MCV 91.8 12/31/2017   PLT 136 (L) 12/31/2017    Lab Results  Component Value Date   NA 138 10/21/2019   K 3.8 10/21/2019   CL 101 10/21/2019   CO2 25 10/21/2019   BUN 14 10/21/2019   CREATININE 1.33 (H) 10/21/2019   GLUCOSE 91 10/21/2019   ALT 23 10/07/2018    Lab Results  Component Value Date   CHOL 153 10/07/2018   HDL 68 10/07/2018   LDLCALC 50 10/07/2018   TRIG 176 (H) 10/07/2018   CHOLHDL 2.3 10/07/2018     --------------------------------------------------------------------------------------------------  ASSESSMENT AND PLAN: Chronic systolic and diastolic heart failure secondary to nonischemic cardiomyopathy: Earl Hays patient has mild abdominal distention today and is also up 2 pounds since July, though he reports that his weight has increased more over the last few weeks.  Some of this may be related to recent bowel prep, though I am also concerned about some fluid retention in the setting of his heart failure.  I recommended increasing furosemide to 40 mg daily.  We will check a basic metabolic panel today to ensure that his renal function and electrolytes are normal.  We will not make any changes to his current doses of carvedilol, Entresto, and spironolactone.  Nonobstructive coronary artery disease: No symptoms to suggest worsening coronary insufficiency.  Continue medical therapy with atorvastatin, aspirin, and carvedilol.  Follow-up: Return to clinic in 2 to 3 weeks.  Nelva Bush, MD 10/22/2019 3:33 PM

## 2019-10-22 ENCOUNTER — Encounter: Payer: Self-pay | Admitting: Internal Medicine

## 2019-10-22 ENCOUNTER — Telehealth: Payer: Self-pay | Admitting: *Deleted

## 2019-10-22 LAB — BASIC METABOLIC PANEL
BUN/Creatinine Ratio: 11 (ref 10–24)
BUN: 14 mg/dL (ref 8–27)
CO2: 25 mmol/L (ref 20–29)
Calcium: 9.3 mg/dL (ref 8.6–10.2)
Chloride: 101 mmol/L (ref 96–106)
Creatinine, Ser: 1.33 mg/dL — ABNORMAL HIGH (ref 0.76–1.27)
GFR calc Af Amer: 60 mL/min/{1.73_m2} (ref >59)
GFR calc non Af Amer: 52 mL/min/{1.73_m2} — ABNORMAL LOW (ref >59)
Glucose: 91 mg/dL (ref 65–99)
Potassium: 3.8 mmol/L (ref 3.5–5.2)
Sodium: 138 mmol/L (ref 134–144)

## 2019-10-22 MED ORDER — FUROSEMIDE 40 MG PO TABS: ORAL_TABLET | ORAL | 2 refills | Status: DC

## 2019-10-22 NOTE — Telephone Encounter (Signed)
-----   Message from Nelva Bush, MD sent at 10/22/2019 12:51 PM EDT ----- Please let [name] know that his creatinine is slightly above baseline but labs otherwise normal.  I think he should increase his furosemide to 40 mg daily x 1 week and then decrease it to 20 mg daily.  We will f/u as planned in 2-3 weeks; repeat BMP should be drawn at that time.  No other medication changes are recommended at this time.

## 2019-10-22 NOTE — Telephone Encounter (Signed)
Results called to pt. Pt verbalized understanding or results and to take furosemide 40 mg by mouth daily for one week then go back to 20 mg daily. He is aware of upcoming appointment date and time.

## 2019-11-04 ENCOUNTER — Encounter: Payer: Self-pay | Admitting: Nurse Practitioner

## 2019-11-04 ENCOUNTER — Ambulatory Visit: Payer: Medicare HMO | Admitting: Nurse Practitioner

## 2019-11-04 ENCOUNTER — Telehealth: Payer: Self-pay | Admitting: Internal Medicine

## 2019-11-04 NOTE — Progress Notes (Deleted)
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   Past Medical History    Past Medical History:  Diagnosis Date  . Colon adenoma   . Coronary artery disease    a. 12/2017 Cath: LM min irregs, LAD 40ost, 54m, RI 60, LCX 48m, RCA large, nl.  . Eczema   . GERD (gastroesophageal reflux disease)   . HFrEF (heart failure with reduced ejection fraction) ([place])    a. 12/2017 Echo: EF 30-35%, gr2 DD. Mild to mod MR. Mod dil LA. Nl RV fxn. Mildly dil RA. PASP 28mmHg; b. 04/2018 Echo: EF 30-35%, diff HK. Nl RV fxn.  . Hyperlipidemia   . Hypertension   . NICM (nonischemic cardiomyopathy) ([place])    a. 12/2017 Echo: EF 30-35%, gr2 DD; b. 04/2018 Echo: EF 30-35%.  . Varices, esophageal ([place])    Past Surgical History:  Procedure Laterality Date  . CARDIAC CATHETERIZATION    . CATARACT EXTRACTION W/PHACO Left 01/21/2017   Procedure: CATARACT EXTRACTION PHACO AND INTRAOCULAR LENS PLACEMENT (IOC);  Surgeon: Birder Robson, MD;  Location: [facility] ORS;  Service: Ophthalmology;  Laterality: Left;  [place] 00:48 AP% 21.6 CDE 10.39 Fluid pack lot # [id number]  . CATARACT EXTRACTION W/PHACO Right 02/11/2017   Procedure: CATARACT EXTRACTION PHACO AND INTRAOCULAR LENS PLACEMENT (IOC);  Surgeon: Birder Robson, MD;  Location: [facility] ORS;  Service: Ophthalmology;  Laterality: Right;  [place] 00:42 AP% 22.4 CDE 9.47 Fluid pack lot # [id number]  . COLONOSCOPY    . ESOPHAGOGASTRODUODENOSCOPY    . HERNIA REPAIR    . JOINT REPLACEMENT     TKR  . PROSTATE SURGERY    . REPLACEMENT  TOTAL KNEE BILATERAL Bilateral   . RIGHT/LEFT HEART CATH AND CORONARY ANGIOGRAPHY N/A 12/31/2017   Procedure: RIGHT/LEFT HEART CATH AND CORONARY ANGIOGRAPHY;  Surgeon: Nelva Bush, MD;  Location: [place] [facility];  Service: Cardiovascular;  Laterality: N/A;    Allergies  Allergies  Allergen Reactions  . 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  At the end of October, he was scheduled to have a colonoscopy and unable to clear his colon in spite of using the entire bowel prep. He felt more bloated and thought he had put some weight. During the visit, he had a 2 pound weight gain with no lower extremity edema. He had some mild shortness of breath, which was new from his previous cardiology appointment in July 2020. Furosamide was increased to 40 mg daily for one week and then reassume 20 mg daily. Creatinine was elevated @ 1.33. He denied chest pain and palpitations.    Home Medications    Prior to Admission medications   Medication Sig Start Date End Date Taking? Authorizing Provider  aspirin EC 81 MG tablet Take 81 mg by mouth  daily.    [provider]  atorvastatin (LIPITOR) 20 MG tablet Take 1 tablet (20 mg total) by mouth daily at 6 PM. 05/07/19 10/21/19  Alisa Graff, FNP  carvedilol (COREG) 6.25 MG tablet Take 1 tablet (6.25 mg total) by mouth 2 (two) times daily. 05/07/19   Darylene Price A, FNP  COD LIVER OIL PO Take by mouth daily.    [provider]  Cyanocobalamin 3000 MCG SUBL Place 3,000 mcg under the tongue once a week.     [provider]  Docosahexaenoic Acid 100 MG CAPS  Take by mouth daily.     [provider]  Dupilumab, Asthma, (DUPIXENT [place]) Inject 300 mg into the skin every 14 (fourteen) days.    [provider]  furosemide (LASIX) 40 MG tablet Take 1 tablet (40 mg) by mouth daily for 1 week then decrease to 0.5 tablet (20 mg) by mouth once a day. 10/22/19   End, Harrell Gave, MD  Omega-3 Fatty Acids (FISH OIL) 1000 MG CAPS Take by mouth daily.    [provider]  pantoprazole (PROTONIX) 40 MG tablet Take 40 mg by mouth daily.     [provider]  sacubitril-valsartan (ENTRESTO) 24-26 MG Take 1 tablet by mouth 2 (two) times daily.    [provider]  spironolactone (ALDACTONE) 25 MG tablet Take 1 tablet (25 mg total) by mouth daily. 05/07/19   Alisa Graff, FNP    Review of Systems    ***.  All other systems reviewed and are otherwise negative except as noted above.  Physical Exam    VS:  There were no vitals taken for this visit. , BMI There is no height or weight on file to calculate BMI. GEN: Well nourished, well developed, in no acute distress. HEENT: normal. Neck: Supple, no JVD, carotid bruits, or masses. Cardiac: RRR, no murmurs, rubs, or gallops. No clubbing, cyanosis, edema.  Radials/DP/PT 2+ and equal bilaterally.  Respiratory:  Respirations regular and unlabored, clear to auscultation bilaterally. GI: Soft, nontender, nondistended, BS + x 4. MS: no deformity or atrophy. Skin: warm and dry, no rash. Neuro:  Strength and sensation are intact. Psych: Normal affect.  Accessory Clinical Findings    ECG personally reviewed by me today - *** - no acute changes.  Assessment & Plan    1.  Chronic systolic and diasystolic heart failure secondary to nonischemic cardiomyopathy:     Murray Hodgkins, PA-C 11/04/2019, 9:13 AM

## 2019-11-04 NOTE — Telephone Encounter (Signed)
Pt c/o medication issue:  1. Name of Medication: furosemide   2. How are you currently taking this medication (dosage and times per day)? 40 mg po q d x 7 days   3. Are you having a reaction (difficulty breathing--STAT)? no  4. What is your medication issue?  Patient was told to go back to 20 mg po q day after 7 days and he is concerned he may continue to need 40 mg po   Patient missed appt this morning due to weather .  Added to waitlist scheduled for 11/18 with End .  Patient concerned about waiting that long for med concern answer.

## 2019-11-04 NOTE — Telephone Encounter (Signed)
Called patient and was able to reschedule him to tomorrow, at 2:45 pm with [name], NP. Patient pleased with this option and had no further issues at this time.

## 2019-11-05 ENCOUNTER — Ambulatory Visit (INDEPENDENT_AMBULATORY_CARE_PROVIDER_SITE_OTHER): Payer: Medicare HMO | Admitting: Nurse Practitioner

## 2019-11-05 ENCOUNTER — Encounter: Payer: Self-pay | Admitting: Nurse Practitioner

## 2019-11-05 ENCOUNTER — Other Ambulatory Visit: Payer: Self-pay

## 2019-11-05 VITALS — BP 120/78 | HR 71 | Temp 97.2°F | Ht 70.0 in | Wt 187.5 lb

## 2019-11-05 DIAGNOSIS — I5022 Chronic systolic (congestive) heart failure: Secondary | ICD-10-CM

## 2019-11-05 NOTE — Progress Notes (Signed)
Office Visit    Patient Name: Earl Hays Date of Encounter: 11/05/2019  Primary Care Provider:  Rusty Aus, MD Primary Cardiologist:  Nelva Bush, MD  Chief Complaint    78 year old male with a history of chronic systolic and diastolic congestive heart failure, nonischemic cardiomyopathy, nonobstructive CAD, esophageal varices with history of upper GI bleed, recurrent epistaxis, and eczema, who presents for follow-up related to CHF.  Past Medical History    Past Medical History:  Diagnosis Date  . Colon adenoma   . Coronary artery disease    a. 12/2017 Cath: LM min irregs, LAD 40ost, 3m, RI 60, LCX 32m, RCA large, nl.  . Eczema   . GERD (gastroesophageal reflux disease)   . HFrEF (heart failure with reduced ejection fraction) ([place])    a. 12/2017 Echo: EF 30-35%, gr2 DD. Mild to mod MR. Mod dil LA. Nl RV fxn. Mildly dil RA. PASP 67mmHg; b. 04/2018 Echo: EF 30-35%, diff HK. Nl RV fxn.  . Hyperlipidemia   . Hypertension   . NICM (nonischemic cardiomyopathy) ([place])    a. 12/2017 Echo: EF 30-35%, gr2 DD; b. 04/2018 Echo: EF 30-35%.  . Varices, esophageal ([place])    Past Surgical History:  Procedure Laterality Date  . CARDIAC CATHETERIZATION    . CATARACT EXTRACTION W/PHACO Left 01/21/2017   Procedure: CATARACT EXTRACTION PHACO AND INTRAOCULAR LENS PLACEMENT (IOC);  Surgeon: Birder Robson, MD;  Location: [facility] ORS;  Service: Ophthalmology;  Laterality: Left;  [place] 00:48 AP% 21.6 CDE 10.39 Fluid pack lot # [id number]  . CATARACT EXTRACTION W/PHACO Right 02/11/2017   Procedure: CATARACT EXTRACTION PHACO AND INTRAOCULAR LENS PLACEMENT (IOC);  Surgeon: Birder Robson, MD;  Location: [facility] ORS;  Service: Ophthalmology;  Laterality: Right;  [place] 00:42 AP% 22.4 CDE 9.47 Fluid pack lot # [id number]  . COLONOSCOPY    . ESOPHAGOGASTRODUODENOSCOPY    . HERNIA REPAIR    . JOINT REPLACEMENT     TKR  . PROSTATE SURGERY    . REPLACEMENT TOTAL KNEE BILATERAL Bilateral   . RIGHT/LEFT  HEART CATH AND CORONARY ANGIOGRAPHY N/A 12/31/2017   Procedure: RIGHT/LEFT HEART CATH AND CORONARY ANGIOGRAPHY;  Surgeon: Nelva Bush, MD;  Location: [facility];  Service: Cardiovascular;  Laterality: N/A;    Allergies  Allergies  Allergen Reactions  . Lamisil [Terbinafine] Itching and Rash    History of Present Illness    78 year old male with a history of chronic systolic and diastolic congestive heart failure, nonischemic cardiomyopathy, nonobstructive CAD, esophageal varices with history of upper GI bleed, recurrent epistaxis, and eczema (previously on methotrexate).  He was recently seen by Dr. Saunders Revel on October 29 in the setting of abdominal bloating.  1 day prior, he had been scheduled for colonoscopy but was unable to clear his colon in spite of using the entire bowel prep.  The case was canceled.  His weight was up 2 pounds at the time of his office visit and he reported mild dyspnea.  He was advised to increase his Lasix to 40 mg once daily x1 week and then drop back down to 20 mg daily in the setting of a slight rise in creatinine to 1.33 on follow-up labs on October 29.  Since his last visit, he has been feeling well.  His wt is down 2 lbs. he still notes some abdominal bloating but denies chest pain, dyspnea, palpitations, PND, orthopnea, dizziness, syncope, edema, or early satiety.  The only time he ever really notes dyspnea is if  he is carrying something heavy while walking.  Home Medications    Prior to Admission medications   Medication Sig Start Date End Date Taking? Authorizing Provider  aspirin EC 81 MG tablet Take 81 mg by mouth daily.    [provider]  atorvastatin (LIPITOR) 20 MG tablet Take 1 tablet (20 mg total) by mouth daily at 6 PM. 05/07/19 10/21/19  Alisa Graff, FNP  carvedilol (COREG) 6.25 MG tablet Take 1 tablet (6.25 mg total) by mouth 2 (two) times daily. 05/07/19   Darylene Price A, FNP  COD LIVER OIL PO Take by mouth daily.     [provider]  Cyanocobalamin 3000 MCG SUBL Place 3,000 mcg under the tongue once a week.     [provider]  Docosahexaenoic Acid 100 MG CAPS Take by mouth daily.     [provider]  Dupilumab, Asthma, (DUPIXENT [place]) Inject 300 mg into the skin every 14 (fourteen) days.    [provider]  furosemide (LASIX) 40 MG tablet Take 1 tablet (40 mg) by mouth daily for 1 week then decrease to 0.5 tablet (20 mg) by mouth once a day. 10/22/19   End, Harrell Gave, MD  Omega-3 Fatty Acids (FISH OIL) 1000 MG CAPS Take by mouth daily.    [provider]  pantoprazole (PROTONIX) 40 MG tablet Take 40 mg by mouth daily.     [provider]  sacubitril-valsartan (ENTRESTO) 24-26 MG Take 1 tablet by mouth 2 (two) times daily.    [provider]  spironolactone (ALDACTONE) 25 MG tablet Take 1 tablet (25 mg total) by mouth daily. 05/07/19   Alisa Graff, FNP    Review of Systems    Some dyspnea if walking while carrying something heavy.  He has somewhat chronic abdominal bloating which has not changed significantly despite 2 pound weight loss/diuresis.  He denies chest pain, palpitations, PND, orthopnea, dizziness, syncope, edema, or early satiety.  All other systems reviewed and are otherwise negative except as noted above.  Physical Exam    VS:  BP 120/78 (BP Location: Left Arm, Patient Position: Sitting, Cuff Size: Normal)   Pulse 71   Temp (!) 97.2 F (36.2 C)   Ht 5\' 10"  (1.778 m)   Wt 187 lb 8 oz (85 kg)   BMI 26.90 kg/m  , BMI Body mass index is 26.9 kg/m. GEN: Well nourished, well developed, in no acute distress. HEENT: normal. Neck: Supple, no JVD, carotid bruits, or masses. Cardiac: RRR, no murmurs, rubs, or gallops. No clubbing, cyanosis, edema.  Radials/PT 2+ and equal bilaterally.  Respiratory:  Respirations regular and unlabored, clear to auscultation bilaterally. GI: Protuberant, semifirm, nontender, nondistended, BS + x  4. MS: no deformity or atrophy. Skin: warm and dry, no rash. Neuro:  Strength and sensation are intact. Psych: Normal affect.  Accessory Clinical Findings    ECG personally reviewed by me today -regular sinus rhythm, 71, PVC, left axis, inferior Q's- no acute changes.  Lab Results  Component Value Date   WBC 7.4 12/31/2017   HGB 13.9 12/31/2017   HCT 41.3 12/31/2017   MCV 91.8 12/31/2017   PLT 136 (L) 12/31/2017   Lab Results  Component Value Date   CREATININE 1.33 (H) 10/21/2019   BUN 14 10/21/2019   NA 138 10/21/2019   K 3.8 10/21/2019   CL 101 10/21/2019   CO2 25 10/21/2019   Lab Results  Component Value Date   ALT 23 10/07/2018   AST  28 01/24/2014   ALKPHOS 60 01/24/2014   BILITOT 0.6 01/24/2014   Lab Results  Component Value Date   CHOL 153 10/07/2018   HDL 68 10/07/2018   LDLCALC 50 10/07/2018   TRIG 176 (H) 10/07/2018   CHOLHDL 2.3 10/07/2018     Assessment & Plan    1.  Chronic systolic diastolic congestive heart failure/nonischemic cardiomyopathy: At recent visit on October 29, he complained of abdominal bloating and some dyspnea.  His Lasix was increased to 40 mg daily x1 week and now he is back down to 20 mg daily.  His weight is down 2 pounds since his last visit.  He has not had any significant dyspnea though at higher levels of activity or when carrying something, he may notice some.  He continues to have some abdominal bloating but notes that this is chronic.  He is overall euvolemic on examination.  Heart rate and blood pressure stable.  Continue beta-blocker, Entresto, Aldactone, and current dose of Lasix.  Follow-up basic metabolic panel today given slight rise in creatinine on last check.  2.  Essential hypertension: Stable on beta-blocker, Entresto, spironolactone, and Lasix.  3.  Nonobstructive coronary artery disease: Mild diffuse disease in the left coronary tree on catheterization in January 2019.  No chest pain.  He remains on aspirin,  beta-blocker, and statin therapy.  4.  Hyperlipidemia: LDL of 84 in July.  5.  Disposition: Follow-up basic metabolic panel today.  Follow-up with Dr. Saunders Revel in 3 months or sooner if necessary.   Murray Hodgkins, NP 11/05/2019, 2:59 PM

## 2019-11-05 NOTE — Patient Instructions (Signed)
Medication Instructions:  Your physician recommends that you continue on your current medications as directed. Please refer to the Current Medication list given to you today.  *If you need a refill on your cardiac medications before your next appointment, please call your pharmacy*  Lab Work: Your physician recommends that you have lab work today(BMET)  If you have labs (blood work) drawn today and your tests are completely normal, you will receive your results only by: Marland Kitchen MyChart Message (if you have MyChart) OR . A paper copy in the mail If you have any lab test that is abnormal or we need to change your treatment, we will call you to review the results.  Testing/Procedures: None ordered   Follow-Up: At [facility], you and your health needs are our priority.  As part of our continuing mission to provide you with exceptional heart care, we have created designated Provider Care Teams.  These Care Teams include your primary Cardiologist (physician) and Advanced Practice Providers (APPs -  Physician Assistants and Nurse Practitioners) who all work together to provide you with the care you need, when you need it.  Your next appointment:   3 months  The format for your next appointment:   In Person  Provider:    You may see Nelva Bush, MD or Murray Hodgkins, NP.

## 2019-11-06 LAB — BASIC METABOLIC PANEL
BUN/Creatinine Ratio: 12 (ref 10–24)
BUN: 16 mg/dL (ref 8–27)
CO2: 27 mmol/L (ref 20–29)
Calcium: 9.3 mg/dL (ref 8.6–10.2)
Chloride: 101 mmol/L (ref 96–106)
Creatinine, Ser: 1.29 mg/dL — ABNORMAL HIGH (ref 0.76–1.27)
GFR calc Af Amer: 62 mL/min/{1.73_m2} (ref >59)
GFR calc non Af Amer: 53 mL/min/{1.73_m2} — ABNORMAL LOW (ref >59)
Glucose: 92 mg/dL (ref 65–99)
Potassium: 4 mmol/L (ref 3.5–5.2)
Sodium: 141 mmol/L (ref 134–144)

## 2019-11-10 ENCOUNTER — Ambulatory Visit: Payer: Medicare HMO | Admitting: Internal Medicine

## 2019-11-29 NOTE — Unmapped (Signed)
PA for Dupixent approved via email.

## 2019-11-29 NOTE — Unmapped (Signed)
PA initiated for Dupixent 300mg /63ml pre-filled syringes. Key: [id number]

## 2019-12-08 ENCOUNTER — Encounter: Admit: 2019-12-08 | Discharge: 2019-12-09 | Payer: BLUE CROSS/BLUE SHIELD

## 2019-12-08 DIAGNOSIS — L2084 Intrinsic (allergic) eczema: Principal | ICD-10-CM

## 2019-12-08 DIAGNOSIS — R21 Rash and other nonspecific skin eruption: Principal | ICD-10-CM

## 2019-12-08 MED ORDER — CETIRIZINE 10 MG TABLET
ORAL_TABLET | Freq: Every day | ORAL | 3 refills | 30.00000 days | Status: CP
Start: 2019-12-08 — End: 2020-12-07

## 2019-12-08 MED ORDER — HALOBETASOL PROPIONATE 0.05 % TOPICAL OINTMENT
6 refills | 0 days | Status: CP
Start: 2019-12-08 — End: ?

## 2019-12-08 MED ORDER — TRIAMCINOLONE ACETONIDE 0.1 % TOPICAL OINTMENT
6 refills | 0 days | Status: CP
Start: 2019-12-08 — End: ?

## 2019-12-08 NOTE — Unmapped (Signed)
ASSESSMENT AND PLAN:    Atopic dermatitis, overall improved but still with significant itch:  -Continue Dupixent as I believe he is gotten significant improvement with this.  -We discussed the possibility of switching of his medications for congestive heart failure but these are very important medications and he is also very adamant that he believes the rash started before he was on these medications so we will hold off for now.  -We discussed the option of trying to readd aggressive topical therapy (triamcinolone and halobetasol) to his regimen as well as good skin hydration.  He is overall very dry and he would likely benefit significantly from this if he could use them regularly.  -Finally, we discussed another option of adding CellCept to his regimen.  We discussed risks and benefits with this.  Obviously the downside is that this is an additional medication for him which could potentially have side effects especially side effect in terms of immunosuppression.  After discussion of this, he would prefer to hold off on adding an additional medication for now, although we would keep that as an option in our back pocket.  -We will also keep the option of potentially restarting light therapy once the coronavirus pandemic is over and we are able to do so again.  -I also suggested in the possibility of trying a Zyrtec daily to see if this might help with the itch.  We discussed risks and benefits of this as well.    Orders:   - halobetasol (ULTRAVATE) 0.05 % ointment; Apply topically twice daily for stubborn areas of eczema, as needed only.  Dispense: 50 g; Refill: 6  - triamcinolone (KENALOG) 0.1 % ointment; Apply topically for itch and rash 2-3 times daily with cake frosting like application.  Dispense: 454 g; Refill: 6  - cetirizine (ZYRTEC) 10 MG tablet; Take 1 tablet (10 mg total) by mouth daily.  Dispense: 30 tablet; Refill: 3    Return to clinic: 3 months.     CHIEF COMPLAINT:  Atopic dermatitis    HPI: This is a pleasant 78 y.o.-year-old who last saw me in 07/27/2019.  At that time he was seen for his chronic eczematous dermatitis which had improved on dupilumab.  Prior treatments and also included clobetasol, triamcinolone, betamethasone, methotrexate.  The dupilumab has been somewhat helpful.  The medication that is most helpful for him is prednisone although he has a strong contraindication to this given his cardiac status with a history of acute on chronic congestive heart failure.  At last visit, we did a biopsy that was consistent with a chronic spongiotic dermatitis but inflammation was overall relatively minimal.  We had talked him previously about doing extended patch testing but he has had true testing prior to seeing [place].  His last lab work with [place] was in 2019.  His differential with [place] at that time did not show an elevation in eosinophils.  He has labs in care everywhere which show a normal chem profile except for a creatinine of 1.29.  His glucose looks good.  He had a negative coronavirus test in October.  We have also treated him empirically with permethrin and ivermectin.    Since last visit he continues to have itch.  This seems to recently be especially associated with the cold weather.  He is tolerating Dupixent otherwise well.  He is demonstrated overall improvement since the Dupixent.  He believes that all of his rash and itching started before he started his medications for congestive heart failure.  He is only currently minimally using topicals steroids.    PAST MEDICAL HISTORY:  Eczema  Acute on chronic systolic CHF, cardiomyopathy  Reactive airway disease  HTN  ??  No personal history of skin cancer  No diabetes  ??  Eczema specific history:  -Was previously seen at [facility] dermatology where per report he had biopsies consistent with eczema and also underwent patch testing which showed that he was allergic to gold.  Biopsy from March 2018 is scanned into our media tab. -Prednisone has previously been effective for controlling his eczema but this is relatively contraindicated given his congestive heart failure  -He got minimal benefit from methotrexate and this was discontinued  -We have also previously treated him empirically for scabies with ivermectin and permethrin  -He believes phototherapy has been the most helpful treatment thus far    MEDICATIONS:   Reviewed in Epic.      ALLERGIES:   Terbinafine hcl    SOCIAL HISTORY:  [place] locally  Enjoys golf    REVIEW OF SYSTEMS:  Baseline state of health. No recent illnesses. No other skin complaints.    PHYSICAL EXAMINATION:  Examination in the presence of male chaperone:  General: Well-developed, well-nourished. No acute distress. Neuro: Alert and oriented, answers questions appropriately.  Skin: Examination of the scalp, face, neck, chest, abdomen, back, bilateral upper extremities, bilateral lower extremities was performed and notable for the following:  Thin eczematous patches on armpits, forearms, and legs, significantly improved since photos  Severe diffuse xerosis  Background postinflammatory hyperpigmentation

## 2019-12-08 NOTE — Unmapped (Addendum)
Dear [name],    [place] to see you today.  We saw you again for your eczema.  Overall, this is improved but we are still struggling with itch.  We talked about options today.  One option is to resume liberal use of triamcinolone ointment and also a strong steroid for stubborn areas called halobetasol.  This may work better than before since her eczema has improved while being on the Dupixent.  I would recommend applying a cake frost like layering to all affected areas with the triamcinolone twice daily.  Between applications, I recommend good skin hydration with things like Vaseline or Aquaphor.  I think part of the reason your skin itches so much is because it is so dry.  Obviously, the dry air in the winter also makes this worse.    Another option we discussed today is the possibility of trying a more aggressive medication approach.  The medication that I would be thinking about would be CellCept.  We discussed briefly side effects of this medication today.  Theoretical side effects are things like an increased risk of infection and malignancy and we would also need to do labs while you are on this.  I know that you are somewhat hesitant to start a new medication given that you are on a number of other medications and that is reasonable as well.  However, this is something we can keep in our pocket in case we need to use it in the future.    Finally, you might also consider trying a Zyrtec daily which is an antihistamine which can sometimes help reduce certain types of inflammation.  Common side effects of Zyrtec are things like dry mouth and sometimes difficulty going to the bathroom.  If you experience the side effects do not feel like you need to continue it but it is an overall very safe medication and may be worth a try.    I hope you have a great holiday.    Sincerely,    Donald Percy, MD        Try Zyrtec at night to help with itching. Stop taking it if you experience any side effects. Patient Education        mycophenolate mofetil (oral/injection)  Pronunciation:  MYE koe FEN oh late MOE fe til  Brand:  CellCept  What is the most important information I should know about mycophenolate mofetil?  This medicine can cause a miscarriage or birth defects when used during pregnancy. Both men and women using mycophenolate mofetil should use effective birth control to prevent pregnancy.  Mycophenolate mofetil may cause your body to overproduce white blood cells. This can lead to cancer, severe brain infection causing disability or death, or a viral infection causing kidney transplant failure.  Call your doctor right away if you have symptoms such as: fever, swollen glands, night sweats, weight loss, vomiting or diarrhea, burning when you urinate, a new skin lesion, any change in your mental state, weakness on one side of your body, problems with speech or vision, or tenderness near your transplanted kidney.  What is mycophenolate mofetil?  Mycophenolate mofetil is used with other medicines to prevent organ rejection after a kidney, liver, or heart transplant. Organ rejection happens when the immune system treats the new organ as an invader and attacks it.  Mycophenolate mofetil may also be used for purposes not listed in this medication guide.  What should I discuss with my healthcare provider before using mycophenolate mofetil?  You should not  use mycophenolate mofetil if you are allergic to it, mycophenolic acid (Myfortic), or to an ingredient called Polysorbate 80.  Talk with your doctor about the risks and benefits of mycophenolate mofetil. This medicine can affect your immune system, and may cause overproduction of certain white blood cells. This can lead to cancer, severe brain infection causing disability or death, or a viral infection causing kidney transplant failure.  Tell your doctor if you have ever had:  ?? a stomach ulcer or problems with digestion;  ?? diabetes;  ?? hepatitis B or C; ?? phenylketonuria, or PKU (the liquid form of this medicine may contain phenylalanine); or  ?? a rare inherited enzyme deficiency such as Lesch-Nyhan syndrome or Kelley-Seegmiller syndrome.  Mycophenolate mofetil can cause a miscarriage or birth defects if the mother or the father is using this medicine.  ?? If you are a woman, do not use mycophenolate mofetil if you are pregnant. Use effective birth control to prevent pregnancy while you are using this medicine and for at least 6 weeks after your last dose.  ?? If you are a man, use effective birth control if your sex partner is able to get pregnant. Keep using birth control for at least 90 days after your last dose.  ?? Tell your doctor right away if a pregnancy occurs while either the mother or the father is using mycophenolate mofetil.  You may need to have a negative pregnancy test before starting this treatment.  Mycophenolate mofetil can make birth control pills less effective. Ask your doctor about using a non-hormonal birth control (condom, diaphragm, cervical cap, or contraceptive sponge) to prevent pregnancy.  If a pregnancy occurs during treatment, do not stop using mycophenolate mofetil. Call your doctor for instructions. Also call the Mycophenolate Pregnancy Registry ([phone]).  You should not breastfeed while using mycophenolate mofetil.  Mycophenolate mofetil is not approved for use for heart or liver transplants by anyone younger than 76 years old. This medicine is not approved for use for kidney transplants by anyone younger than 75 months old.  How should I use mycophenolate mofetil?  Follow all directions on your prescription label and read all medication guides or instruction sheets. Use the medicine exactly as directed.  You must remain under the care of a doctor while you are using mycophenolate mofetil.  Mycophenolate mofetil injection is given as an infusion into a vein. A healthcare provider will give you this injection. Take oral mycophenolate mofetil on an empty stomach, at least 1 hour before or 2 hours after a meal.  Swallow the capsule whole and do not crush, chew, break, or open it. Tell your doctor if you have trouble swallowing a tablet or capsule.  Shake the oral suspension (liquid) before you measure a dose. Use the dosing syringe provided, or use a medicine dose-measuring device (not a kitchen spoon).  Read and carefully follow any Instructions for Use provided with your medicine. Ask your doctor or pharmacist if you do not understand these instructions.  Your dose needs may change if you switch to a different brand, strength, or form of this medicine.  Avoid medication errors by using only the form and strength your doctor prescribes.  You will need frequent medical tests.  If you've ever had hepatitis B or C, using mycophenolate mofetil can cause this virus to become active or get worse. You may need frequent liver function tests while using this medicine and for several months after you stop.  Store at room temperature away from [place]  and heat. Keep the bottle tightly closed when not in use. Throw away any unused liquid that is older than 60 days.  The liquid medicine may also be stored in the refrigerator. Do not freeze.  What happens if I miss a dose?  Use the medicine as soon as you can, but skip the missed dose if it is less than 2 hours away for your next dose. Do not use two doses at one time.  What happens if I overdose?  Seek emergency medical attention or call the Poison Help line at [phone].  What should I avoid while using mycophenolate mofetil?  Avoid driving or hazardous activity until you know how this medicine will affect you. Your reactions could be impaired.  You must not donate blood or sperm while using this medicine, and for at least 6 weeks (for blood) or 90 days (for sperm) after your last dose. Do not receive a live vaccine while using mycophenolate mofetil. The vaccine may not work as well and may not fully protect you from disease. Live vaccines include measles, mumps, rubella (MMR), rotavirus, typhoid, yellow fever, varicella (chickenpox), zoster (shingles), and nasal flu (influenza) vaccine.  Mycophenolate mofetil can make you sunburn more easily. Avoid sunlight or tanning beds. Wear protective clothing and use sunscreen (SPF 30 or higher) when you are outdoors.  What are the possible side effects of mycophenolate mofetil?  Get emergency medical help if you have signs of an allergic reaction: hives; difficult breathing; swelling of your face, lips, tongue, or throat.  Mycophenolate mofetil can affect your immune system, and may cause certain white blood cells to grow out of control. Call your doctor right away if you have:  ?? fever, swollen glands, painful mouth sores, cold or flu symptoms, headache, ear pain;  ?? stomach pain, vomiting, diarrhea, weight loss;  ?? weakness on one side of your body, loss of muscle control;  ?? confusion, thinking problems, loss of interest in things that normally interest you;  ?? pain or burning when you urinate;  ?? tenderness around the transplanted kidney;  ?? swelling, warmth, redness, or oozing around a skin wound; or  ?? a new skin lesion, or a mole that has changed in size or color.  Also call your doctor at once if you have:  ?? bloody or tarry stools, coughing up blood or vomit that looks like coffee grounds;  ?? a blood infection (sepsis) --fever, flu symptoms, mouth and throat ulcers, rapid heart rate, shallow breathing; or  ?? low blood cell counts --fever, chills, tiredness, mouth sores, skin sores, easy bruising, unusual bleeding, pale skin, cold hands and feet, feeling light-headed or short of breath.  Common side effects may include:  ?? stomach pain, nausea, vomiting, diarrhea, constipation;  ?? swelling in your ankles or feet;  ?? rash; ?? headache, dizziness, tremors;  ?? fever, sore throat, cold symptoms, or other signs of infections;  ?? high blood sugar;  ?? abnormal blood tests;  ?? pain anywhere in your body;  ?? low blood cell counts; or  ?? increased blood pressure or heart rate.  This is not a complete list of side effects and others may occur. Call your doctor for medical advice about side effects. You may report side effects to FDA at 1-800-FDA-1088.  What other drugs will affect mycophenolate mofetil?  If you take sevelamer or an antacid, take your oral mycophenolate mofetil dose 2 hours before you take these other medicines.  Sometimes it is not safe to use  certain medications at the same time. Some drugs can affect your blood levels of other drugs you take, which may increase side effects or make the medications less effective.  Tell your doctor about all your other medicines, especially:  ?? azathioprine;  ?? birth control pills or hormone replacement therapy;  ?? cholestyramine;  ?? antiviral medicine --acyclovir, ganciclovir, valacyclovir, valganciclovir;  ?? an antibiotic --amoxicillin, ciprofloxacin, metronidazole, norfloxacin, rifampin, sulfa drugs (SMX-TMP or SMZ-TMP, and others); or  ?? a stomach acid reducer --esomeprazole, lansoprazole, omeprazole, Nexium, Prevacid, Prilosec, Protonix, and others.  This list is not complete. Other drugs may affect mycophenolate mofetil, including prescription and over-the-counter medicines, vitamins, and herbal products. Not all possible drug interactions are listed here.  Where can I get more information?  Your doctor or pharmacist can provide more information about mycophenolate mofetil.  Remember, keep this and all other medicines out of the reach of children, never share your medicines with others, and use this medication only for the indication prescribed. Every effort has been made to ensure that the information provided by [employer] ('[employer]') is accurate, up-to-date, and complete, but no guarantee is made to that effect. Drug information contained herein may be time sensitive. [employer] information has been compiled for use by healthcare practitioners and consumers in the [place] and therefore [employer] does not warrant that uses outside of the [place] are appropriate, unless specifically indicated otherwise. [employer]'s drug information does not endorse drugs, diagnose patients or recommend therapy. [employer]'s drug information is an [employer], corporate to assist licensed healthcare practitioners in caring for their patients and/or to serve consumers viewing this service as a supplement to, and not a substitute for, the expertise, skill, knowledge and judgment of healthcare practitioners. The absence of a warning for a given drug or drug combination in no way should be construed to indicate that the drug or drug combination is safe, effective or appropriate for any given patient. [employer] does not assume any responsibility for any aspect of healthcare administered with the aid of information [employer] provides. The information contained herein is not intended to cover all possible uses, directions, precautions, warnings, drug interactions, allergic reactions, or adverse effects. If you have questions about the drugs you are taking, check with your doctor, nurse or pharmacist.  Copyright [phone] [employer] Version: 10.01. Revision date: 01/15/2019.  Care instructions adapted under license by [facility]. If you have questions about a medical condition or this instruction, always ask your healthcare professional. [employer] disclaims any warranty or liability for your use of this information.       Patient Education        triamcinolone topical  Pronunciation:  TRYE am SIN oh lone Brand:  DermasilkRx SDS Pak, Dermasorb TA, DermaWerx SDS Pak, [place], [place], [place], Triderm  What is the most important information I should know about triamcinolone topical?  Follow all directions on your medicine label and package. Tell each of your healthcare providers about all your medical conditions, allergies, and all medicines you use.  What is triamcinolone topical?  Triamcinolone is a potent steroid that helps reduce inflammation in the body.  Triamcinolone topical (for the skin) is used to treat the inflammation and itching caused by skin conditions that respond to steroid medication.  The dental paste form of triamcinolone topical is used to treat mouth ulcers.  Triamcinolone topical may also be used for purposes not listed in this medication guide.  What should I discuss with my healthcare provider before using  triamcinolone topical?  You should not use triamcinolone if you are allergic to it.  Tell your doctor if you have ever had:  ?? any type of skin infection;  ?? a skin reaction to any steroid medicine;  ?? liver disease; or  ?? an adrenal gland disorder.  Steroid medicines can increase the glucose (sugar) levels in your blood or urine. Tell your doctor if you have diabetes.  Tell your doctor if you are pregnant or breastfeeding. If you apply triamcinolone to your chest, avoid areas that may come into contact with the nursing baby's mouth.  How should I use triamcinolone topical?  Follow all directions on your prescription label and read all medication guides or instruction sheets. Use the medicine exactly as directed.  Triamcinolone topical cream, lotion, ointment, or spray is for use only on the skin. Triamcinolone dental paste is applied directly onto an ulcer inside the mouth and left in place.  Do not swallow this medicine.    Wash your hands before and after using triamcinolone topical, unless you are using this medicine to treat the skin on your hands. Apply a thin layer of medicine to the affected skin. Do not apply this medicine over a large area of skin unless your doctor has told you to.  Do not cover the treated skin area with a bandage or other covering unless your doctor tells you to. Covering treated areas can increase the amount of medicine absorbed through your skin and may cause harmful effects.  If you are treating the diaper area, do not use plastic pants or tight-fitting diapers.  To use the dental paste, press a small dab onto the mouth ulcer but do not rub in the medicine. The dab will form a thin film that should be left in place for several hours. Triamcinolone dental paste is usually applied at bedtime and/or after meals. Follow your doctor's instructions.  Call your doctor if your symptoms do not improve, or if they get worse. A mouth ulcer should improve within 1 week of using triamcinolone dental paste.  You should stop using this medicine once your symptoms are controlled.  Store at room temperature away from moisture and heat.  What happens if I miss a dose?  Apply the medicine as soon as you can, but skip the missed dose if it is almost time for your next dose. Do not apply two doses at one time.  What happens if I overdose?  Seek emergency medical attention or call the Poison Help line at [phone] if anyone has accidentally swallowed the medication.  High doses or long-term use of steroid medicine can lead to thinning skin, easy bruising, changes in body fat (especially in your face, neck, back, and waist), increased acne or facial hair, menstrual problems, impotence, or loss of interest in sex.  What should I avoid while using triamcinolone topical?  Avoid getting this medicine in your eyes.  Avoid using other topical steroid medications on the areas you treat with triamcinolone unless your doctor tells you to.  Do not use triamcinolone topical to treat any condition that has not been checked by your doctor. Do not share this medicine with another person, even if they have the same symptoms you have.  What are the possible side effects of triamcinolone topical?  Get emergency medical help if you have signs of an allergic reaction: hives; difficult breathing; swelling of your face, lips, tongue, or throat.  Call your doctor at once if you have:  ?? worsening  of your skin condition;  ?? redness, warmth, swelling, oozing, or severe irritation of any treated skin;  ?? blurred vision, tunnel vision, eye pain, or seeing halos around lights;  ?? high blood sugar --increased thirst, increased urination, dry mouth, fruity breath odor; or  ?? possible signs of absorbing this medicine through your skin or gums --weight gain (especially in your face or your upper back and torso), slow wound healing, thinning or discolored skin, increased body hair, muscle weakness, nausea, diarrhea, tiredness, mood changes, menstrual changes, sexual changes.  Children can absorb larger amounts of this medicine through the skin and may be more likely to have side effects such as growth delay, headaches, or pain behind the eyes. A baby using this medicine may have a bulging soft spot (the top of the head where the skull hasn't yet grown together).  Common side effects may include:  ?? burning, itching, dryness, or other irritation of treated skin;  ?? redness or crusting around your hair follicles;  ?? redness or itching around your mouth;  ?? allergic skin reaction;  ?? stretch marks;  ?? acne, increased body hair growth;  ?? thinning skin or discoloration; or  ?? white or pruned appearance of the skin (caused by covering treated skin with a tight bandage or other covering).  This is not a complete list of side effects and others may occur. Call your doctor for medical advice about side effects. You may report side effects to FDA at 1-800-FDA-1088.  What other drugs will affect triamcinolone topical? Medicine used on the skin is not likely to be affected by other drugs you use. But many drugs can interact with each other. Tell each of your healthcare providers about all medicines you use, including prescription and over-the-counter medicines, vitamins, and herbal products.  Where can I get more information?  Your pharmacist can provide more information about triamcinolone topical.  Remember, keep this and all other medicines out of the reach of children, never share your medicines with others, and use this medication only for the indication prescribed. Every effort has been made to ensure that the information provided by [employer]') is accurate, up-to-date, and complete, but no guarantee is made to that effect. Drug information contained herein may be time sensitive. [employer] information has been compiled for use by healthcare practitioners and consumers in the [place] and therefore [employer] does not warrant that uses outside of the [place] are appropriate, unless specifically indicated otherwise. [employer]'s drug information does not endorse drugs, diagnose patients or recommend therapy. [employer]'s drug information is an [employer], corporate to assist licensed healthcare practitioners in caring for their patients and/or to serve consumers viewing this service as a supplement to, and not a substitute for, the expertise, skill, knowledge and judgment of healthcare practitioners. The absence of a warning for a given drug or drug combination in no way should be construed to indicate that the drug or drug combination is safe, effective or appropriate for any given patient. [employer] does not assume any responsibility for any aspect of healthcare administered with the aid of information [employer] provides. The information contained herein is not intended to cover all possible uses, directions, precautions, warnings, drug interactions, allergic reactions, or adverse effects. If you have questions about the drugs you are taking, check with your doctor, nurse or pharmacist.  Copyright [phone] [employer] Version: 8.02. Revision date: 12/18/2018.  Care instructions adapted under license by [facility]. If you have questions about a medical  condition or this instruction, always ask your healthcare professional. [employer] disclaims any warranty or liability for your use of this information.       Patient Education        halobetasol topical  Pronunciation:  HAL oh BAY ta sol Brand:  Bryhali, Lexette, Ultravate, Ultravate X Ointment  What is the most important information I should know about halobetasol topical?  Follow all directions on your medicine label and package. Tell each of your healthcare providers about all your medical conditions, allergies, and all medicines you use.  What is halobetasol topical?  Halobetasol is a highly potent steroid that helps reduce inflammation in the body.  Halobetasol topical (for the skin) is used to treat inflammation and itching caused by plaque psoriasis or skin conditions that respond to steroid medication.  Halobetasol topical may also be used for purposes not listed in this medication guide.  What should I discuss with my healthcare provider before using halobetasol topical?  You should not use halobetasol if you are allergic to it.  Tell your doctor if you have ever had:  ?? any type of skin infection;  ?? a skin reaction to any steroid medicine;  ?? an adrenal gland disorder;  ?? liver disease; or  ?? if you plan to have surgery.  Steroid medicines can increase the glucose (sugar) levels in your blood or urine. Tell your doctor if you have diabetes.  It is not known whether this medicine will harm an unborn baby. Tell your doctor if you are pregnant.  It may not be safe to breastfeed while using this medicine. Ask your doctor about any risk. If you apply halobetasol to your chest, avoid areas that may come into contact with the baby's mouth.  Halobetasol topical is not approved for use by anyone younger than 76 years old. Some brands or forms of this medicine are for use only in adults 18 and over.  Children can absorb larger amounts of this medicine through the skin and may be more likely to have side effects.  How should I use halobetasol topical?  Follow all directions on your prescription label and read all medication guides or instruction sheets. Use the medicine exactly as directed. Do not take by mouth. Topical medicine is for use only on the skin. Do not use on open wounds or on sunburned, windburned, dry, or irritated skin. Rinse with water if this medicine gets in your eyes or mouth.   Wash your hands before and after using halobetasol, unless you are using this medicine to treat the skin on your hands.  Shake the foam before each use.  Apply a thin layer of halobetasol topical to the affected skin and rub it in gently. Do not apply this medicine over a large area of skin unless your doctor has told you to.  Do not cover the treated skin area with a bandage or other covering unless your doctor tells you to. Covering treated areas can increase the amount of medicine absorbed through your skin and may cause harmful effects.  If you are treating the diaper area, do not use plastic pants or tight-fitting diapers.  This medicine is for short-term use only (2 weeks, or up to 8 weeks for psoriasis). Follow your doctor's dosing instructions very carefully.  If you use halobetasol to treat plaque psoriasis, you should stop using the medicine once your skin symptoms are controlled.  Call your doctor if your symptoms do not improve, or if they get worse.  You should not stop using halobetasol suddenly. Follow your doctor's instructions about tapering your dose.  Store at room temperature away from moisture and heat. Do not freeze.  Keep halobetasol topical foam away from open flame or high heat. The canister may explode if it gets too hot. Do not puncture or burn an empty canister.  What happens if I miss a dose?  Apply the medicine as soon as you can, but skip the missed dose if it is almost time for your next dose. Do not apply two doses at one time.  What happens if I overdose?  Seek emergency medical attention or call the Poison Help line at [phone] if anyone has accidentally swallowed the medication. High doses or long-term use of halobetasol topical can lead to thinning skin, easy bruising, changes in body fat (especially in your face, neck, back, and waist), increased acne or facial hair, menstrual problems, impotence, or loss of interest in sex.  What should I avoid while using halobetasol topical?  Avoid applying this medicine to your face, scalp, underarms, or groin area.  Do not use halobetasol topical to treat any skin condition that has not been checked by your doctor.  Avoid using other topical steroid medications on the areas you treat with halobetasol unless your doctor tells you to.  What are the possible side effects of halobetasol topical?  Get emergency medical help if you have signs of an allergic reaction: hives; difficult breathing; swelling of your face, lips, tongue, or throat.  Call your doctor at once if you have:  ?? worsening of your skin condition;  ?? redness, warmth, swelling, oozing, or severe irritation of any treated skin;  ?? blurred vision, tunnel vision, eye pain, or seeing halos around lights;  ?? high blood sugar --increased thirst, increased urination, dry mouth, fruity breath odor; or  ?? possible signs of absorbing this medicine through your skin --weight gain (especially in your face or your upper back and torso), slow wound healing, thinning or discolored skin, increased body hair, muscle weakness, nausea, diarrhea, tiredness, mood changes, menstrual changes, sexual changes.  Common side effects may include:  ?? burning, stinging, itching, or dryness of treated skin;  ?? pain where the foam was applied;  ?? redness or crusting around your hair follicles;  ?? stretch marks;  ?? spider veins;  ?? headache; or  ?? cold symptoms such as stuffy nose, sneezing, sore throat.  This is not a complete list of side effects and others may occur. Call your doctor for medical advice about side effects. You may report side effects to FDA at 1-800-FDA-1088. What other drugs will affect halobetasol topical?  Medicine used on the skin is not likely to be affected by other drugs you use. But many drugs can interact with each other. Tell each of your healthcare providers about all medicines you use, including prescription and over-the-counter medicines, vitamins, and herbal products.  Where can I get more information?  Your pharmacist can provide more information about halobetasol topical.  Remember, keep this and all other medicines out of the reach of children, never share your medicines with others, and use this medication only for the indication prescribed. Every effort has been made to ensure that the information provided by [employer] ('[employer]') is accurate, up-to-date, and complete, but no guarantee is made to that effect. Drug information contained herein may be time sensitive. [employer] information has been compiled for use by healthcare practitioners and consumers in the [place] and therefore  [employer] does not warrant that uses outside of the [place] are appropriate, unless specifically indicated otherwise. [employer]'s drug information does not endorse drugs, diagnose patients or recommend therapy. [employer]'s drug information is an [employer], corporate to assist licensed healthcare practitioners in caring for their patients and/or to serve consumers viewing this service as a supplement to, and not a substitute for, the expertise, skill, knowledge and judgment of healthcare practitioners. The absence of a warning for a given drug or drug combination in no way should be construed to indicate that the drug or drug combination is safe, effective or appropriate for any given patient. Mult[employer] does not assume any responsibility for any aspect of healthcare administered with the aid of information [employer] provides. The information contained herein is not intended to cover all possible uses, directions, precautions, warnings, drug interactions, allergic reactions, or adverse effects. If you have questions about the drugs you are taking, check with your doctor, nurse or pharmacist.  Copyright [id number] [employer] Version: 8.02. Revision date: 03/02/2019.  Care instructions adapted under license by [facility]. If you have questions about a medical condition or this instruction, always ask your healthcare professional. [employer] disclaims any warranty or liability for your use of this information.

## 2019-12-14 DIAGNOSIS — L309 Dermatitis, unspecified: Principal | ICD-10-CM

## 2019-12-14 DIAGNOSIS — R21 Rash and other nonspecific skin eruption: Principal | ICD-10-CM

## 2019-12-14 MED ORDER — DUPILUMAB 300 MG/2 ML SUBCUTANEOUS SYRINGE
PRN refills | 0 days | Status: CP
Start: 2019-12-14 — End: ?

## 2019-12-14 NOTE — Unmapped (Signed)
Rx request received for:  Requested Prescriptions     Pending Prescriptions Disp Refills   ??? dupilumab (DUPIXENT) 300 mg/2 mL Syrg injection 4 mL PRN     Sig: INJECT 1 SYRINGE EVERY OTHER WEEK FOR MAINTENANCE

## 2019-12-15 DIAGNOSIS — L309 Dermatitis, unspecified: Principal | ICD-10-CM

## 2019-12-15 DIAGNOSIS — R21 Rash and other nonspecific skin eruption: Principal | ICD-10-CM

## 2019-12-15 MED ORDER — DUPILUMAB 300 MG/2 ML SUBCUTANEOUS SYRINGE: mL | 0 refills | 0 days | Status: AC

## 2019-12-15 MED ORDER — DUPILUMAB 300 MG/2 ML SUBCUTANEOUS SYRINGE
SUBCUTANEOUS | PRN refills | 0.00000 days | Status: CP
Start: 2019-12-15 — End: 2019-12-15

## 2019-12-29 NOTE — Unmapped (Signed)
Per CMM, PA was already approved a month ago. Called [employer] to figure out what they needed so Oshea can get his medication. Patient assistance ended on December 31st. Was transferred to a Dupixent [employer] nurse. Was placed on hold for awhile. Wanted confirmation of ICD10 code which was provided. Requested a new PA, which I attempted to do via CMM. The patient currently has access to the requested medication and a PA is not needed.     Called PA number [phone] which was provided by the nurse. Per this rep, test claim paid for it. PA is good 12/23/18 through 12/22/20.     Called [employer] again. She is still not seeing an approval. She says she will send this to the team to review and will have a nurse call Yovanny to explain the hold up.

## 2019-12-29 NOTE — Unmapped (Signed)
Patient called stating Dupixent needs something done so he can get it.

## 2019-12-31 NOTE — Unmapped (Signed)
Called [employer] to figure out why Donald Sanchez still cannot get his dupixent. They do not open until 8am.

## 2019-12-31 NOTE — Unmapped (Signed)
Called [employer] again. Transferred to a Dupixent [employer] nurse. Discussed again that PA was extended, and we did not receive a new PA approval letter. The nurse says they will need to call [employer] to verify which they should have done on Wednesday when I called the first time. They said they should be reaching out to the patient in 48-72 hours.    Total call time: 15 minutes

## 2020-01-12 NOTE — Progress Notes (Signed)
Patient ID: Earl Hays, male    DOB: Sep 18, 1941, 79 y.o.   MRN: [id number]  HPI  Earl Hays is a 79 y/o male with a history of GERD, HTN, eczema, previous tobacco use and chronic heart failure.   Echo report done 04/29/18 reviewed and showed an EF of 30-35%. Echo report done on 12/30/17 reviewed and showed an EF of 30-35% along with mild/mod Earl and moderately increased PA pressure of 45 mm Hg.   Cardiac catheterization done 12/31/17 showed mild-moderate non-obstructive CAD along with elevated left and right pressures.  Has not been admitted or been in the ED in the last 6 months   He presents today with a chief complaint of a follow-up visit. He denies any symptoms and specifically denies any difficulty sleeping, abdominal distention, palpitations, pedal edema, palpitations, pedal edema, chest pain, shortness of breath, cough, dizziness or fatigue.   Past Medical History:  Diagnosis Date  . Colon adenoma   . Coronary artery disease    a. 12/2017 Cath: LM min irregs, LAD 40ost, 34m, RI 60, LCX 19m, RCA large, nl.  . Eczema   . GERD (gastroesophageal reflux disease)   . HFrEF (heart failure with reduced ejection fraction) ([place])    a. 12/2017 Echo: EF 30-35%, gr2 DD. Mild to mod Earl. Mod dil LA. Nl RV fxn. Mildly dil RA. PASP 97mmHg; b. 04/2018 Echo: EF 30-35%, diff HK. Nl RV fxn.  . Hyperlipidemia   . Hypertension   . NICM (nonischemic cardiomyopathy) ([place])    a. 12/2017 Echo: EF 30-35%, gr2 DD; b. 04/2018 Echo: EF 30-35%.  . Varices, esophageal ([place])    Past Surgical History:  Procedure Laterality Date  . CARDIAC CATHETERIZATION    . CATARACT EXTRACTION W/PHACO Left 01/21/2017   Procedure: CATARACT EXTRACTION PHACO AND INTRAOCULAR LENS PLACEMENT (IOC);  Surgeon: Birder Robson, MD;  Location: [facility] ORS;  Service: Ophthalmology;  Laterality: Left;  [place] 00:48 AP% 21.6 CDE 10.39 Fluid pack lot # [id number]  . CATARACT EXTRACTION W/PHACO Right 02/11/2017   Procedure: CATARACT EXTRACTION PHACO AND  INTRAOCULAR LENS PLACEMENT (IOC);  Surgeon: Birder Robson, MD;  Location: [facility] ORS;  Service: Ophthalmology;  Laterality: Right;  [place] 00:42 AP% 22.4 CDE 9.47 Fluid pack lot # [id number]  . COLONOSCOPY    . ESOPHAGOGASTRODUODENOSCOPY    . HERNIA REPAIR    . JOINT REPLACEMENT     TKR  . PROSTATE SURGERY    . REPLACEMENT TOTAL KNEE BILATERAL Bilateral   . RIGHT/LEFT HEART CATH AND CORONARY ANGIOGRAPHY N/A 12/31/2017   Procedure: RIGHT/LEFT HEART CATH AND CORONARY ANGIOGRAPHY;  Surgeon: Nelva Bush, MD;  Location: [facility];  Service: Cardiovascular;  Laterality: N/A;   Family History  Problem Relation Age of Onset  . Brain cancer Mother    Social History   Tobacco Use  . Smoking status: Former Smoker    Types: Cigarettes    Quit date: 2004    Years since quitting: 17.0  . Smokeless tobacco: Never Used  . Tobacco comment: Quit in 2004  Substance Use Topics  . Alcohol use: Yes    Alcohol/week: 1.0 standard drinks    Types: 1 Cans of beer per week    Comment: occ   Allergies  Allergen Reactions  . Lamisil [Terbinafine] Itching and Rash   Prior to Admission medications   Medication Sig Start Date End Date Taking? Authorizing Provider  aspirin EC 81 MG tablet Take 81 mg by mouth daily.   Yes [provider]  atorvastatin (LIPITOR) 20 MG tablet Take 1 tablet (20 mg total) by mouth daily at 6 PM. 05/07/19 01/13/20 Yes Chrislynn Mosely, Aura Fey, FNP  carvedilol (COREG) 6.25 MG tablet Take 1 tablet (6.25 mg total) by mouth 2 (two) times daily. 05/07/19  Yes Tailyn Hantz, Otila Kluver A, FNP  COD LIVER OIL PO Take by mouth daily.   Yes [provider]  Cyanocobalamin 3000 MCG SUBL Place 3,000 mcg under the tongue once a week.    Yes [provider]  Docosahexaenoic Acid 100 MG CAPS Take by mouth daily.    Yes [provider]  Dupilumab, Asthma, (DUPIXENT [place]) Inject 300 mg into the skin every 14 (fourteen) days.   Yes [provider]  furosemide  (LASIX) 40 MG tablet Take 1 tablet (40 mg) by mouth daily for 1 week then decrease to 0.5 tablet (20 mg) by mouth once a day. 10/22/19  Yes End, Harrell Gave, MD  Omega-3 Fatty Acids (FISH OIL) 1000 MG CAPS Take by mouth daily.   Yes [provider]  pantoprazole (PROTONIX) 40 MG tablet Take 40 mg by mouth daily.    Yes [provider]  sacubitril-valsartan (ENTRESTO) 24-26 MG Take 1 tablet by mouth 2 (two) times daily.   Yes [provider]  spironolactone (ALDACTONE) 25 MG tablet Take 1 tablet (25 mg total) by mouth daily. 05/07/19  Yes Alisa Graff, FNP     Review of Systems  Constitutional: Negative for appetite change and fatigue.  HENT: Negative for congestion, postnasal drip and sore throat.   Eyes: Negative.   Respiratory: Negative for cough, chest tightness and shortness of breath.   Cardiovascular: Negative for chest pain, palpitations and leg swelling.  Gastrointestinal: Negative for abdominal distention and abdominal pain.  Endocrine: Negative.   Genitourinary: Negative.   Musculoskeletal: Negative for back pain and neck pain.  Skin: Negative.   Allergic/Immunologic: Negative.   Neurological: Negative for dizziness and light-headedness.  Hematological: Negative for adenopathy. Does not bruise/bleed easily.  Psychiatric/Behavioral: Negative for dysphoric mood and sleep disturbance. The patient is not nervous/anxious.    Vitals:   01/13/20 1319 01/13/20 1320  BP: (!) 152/97 122/70  Pulse: 70   Resp: 18   SpO2: 100%   Weight: 183 lb 3.2 oz (83.1 kg)   Height: 5\' 8"  (1.727 m)    Wt Readings from Last 3 Encounters:  01/13/20 183 lb 3.2 oz (83.1 kg)  11/05/19 187 lb 8 oz (85 kg)  10/21/19 189 lb 8 oz (86 kg)   Lab Results  Component Value Date   CREATININE 1.29 (H) 11/05/2019   CREATININE 1.33 (H) 10/21/2019   CREATININE 1.08 02/12/2019     Physical Exam  Constitutional: He is oriented to person, place, and time. He appears  well-developed and well-nourished.  HENT:  Head: Normocephalic and atraumatic.  Neck: No JVD present.  Cardiovascular: Normal rate and regular rhythm.  Pulmonary/Chest: Effort normal. He has no wheezes. He has no rales.  Abdominal: He exhibits no distension. There is no abdominal tenderness.  Musculoskeletal:        General: No tenderness or edema.     Cervical back: Normal range of motion and neck supple.  Neurological: He is alert and oriented to person, place, and time.  Skin: Skin is warm and dry.  Psychiatric: He has a normal mood and affect. His behavior is normal. Thought content normal.  Nursing note and vitals reviewed.  Assessment & Plan:  1: Chronic heart failure with reduced  ejection fraction- - NYHA class I - euvolemic today - weighing daily. Reminded to call for an overnight weight gain of >2 pounds or a weekly weight gain of >5 pounds - weight down 4 pounds from last visit 6 months ago - not adding salt to his food. Reminded to keep daily sodium intake to 2000mg  daily. Using Mrs. Dash & No salt; says that his appetite is "great" - trying to remain active by walking but he hasn't returned to the gym because he doesn't think he can exercise while wearing a mask - saw cardiology Sharolyn Douglas) 11/05/2019 - last echo was May 2019 so this was scheduled for 01/28/20 - reports receiving his flu and pneumonia vaccines for this season  2: HTN- - BP elevated initially but had improved upon recheck with a manual cuff after sitting for 15 minutes - BMP from 12/27/19 reviewed and showed sodium 143, potassium 3.9, creatinine 1.3  and GFR 65 - saw PCP Sabra Heck) 01/03/20  Reviewed medication bottles.  Return in 6 months or sooner for any questions/problems before then.

## 2020-01-13 ENCOUNTER — Encounter: Payer: Self-pay | Admitting: Family

## 2020-01-13 ENCOUNTER — Ambulatory Visit: Payer: Medicare HMO | Attending: Family | Admitting: Family

## 2020-01-13 ENCOUNTER — Other Ambulatory Visit: Payer: Self-pay

## 2020-01-13 VITALS — BP 122/70 | HR 70 | Resp 18 | Ht 68.0 in | Wt 183.2 lb

## 2020-01-13 DIAGNOSIS — K219 Gastro-esophageal reflux disease without esophagitis: Secondary | ICD-10-CM | POA: Diagnosis not present

## 2020-01-13 DIAGNOSIS — E785 Hyperlipidemia, unspecified: Secondary | ICD-10-CM | POA: Diagnosis not present

## 2020-01-13 DIAGNOSIS — Z79899 Other long term (current) drug therapy: Secondary | ICD-10-CM | POA: Insufficient documentation

## 2020-01-13 DIAGNOSIS — Z96653 Presence of artificial knee joint, bilateral: Secondary | ICD-10-CM | POA: Insufficient documentation

## 2020-01-13 DIAGNOSIS — Z9842 Cataract extraction status, left eye: Secondary | ICD-10-CM | POA: Diagnosis not present

## 2020-01-13 DIAGNOSIS — I10 Essential (primary) hypertension: Secondary | ICD-10-CM

## 2020-01-13 DIAGNOSIS — I5022 Chronic systolic (congestive) heart failure: Secondary | ICD-10-CM | POA: Diagnosis not present

## 2020-01-13 DIAGNOSIS — I110 Hypertensive heart disease with heart failure: Secondary | ICD-10-CM | POA: Diagnosis present

## 2020-01-13 DIAGNOSIS — I428 Other cardiomyopathies: Secondary | ICD-10-CM | POA: Diagnosis not present

## 2020-01-13 DIAGNOSIS — Z883 Allergy status to other anti-infective agents status: Secondary | ICD-10-CM | POA: Diagnosis not present

## 2020-01-13 DIAGNOSIS — Z7982 Long term (current) use of aspirin: Secondary | ICD-10-CM | POA: Insufficient documentation

## 2020-01-13 DIAGNOSIS — Z87891 Personal history of nicotine dependence: Secondary | ICD-10-CM | POA: Insufficient documentation

## 2020-01-13 DIAGNOSIS — Z808 Family history of malignant neoplasm of other organs or systems: Secondary | ICD-10-CM | POA: Diagnosis not present

## 2020-01-13 DIAGNOSIS — I2510 Atherosclerotic heart disease of native coronary artery without angina pectoris: Secondary | ICD-10-CM | POA: Diagnosis not present

## 2020-01-13 NOTE — Patient Instructions (Signed)
Continue weighing daily and call for an overnight weight gain of > 2 pounds or a weekly weight gain of >5 pounds. 

## 2020-01-17 NOTE — Unmapped (Signed)
Dup[employer]ent [employer] left a VM requesting PA status. Faxed approval to [phone].

## 2020-01-28 ENCOUNTER — Other Ambulatory Visit: Payer: Self-pay

## 2020-01-28 ENCOUNTER — Ambulatory Visit
Admission: RE | Admit: 2020-01-28 | Discharge: 2020-01-28 | Disposition: A | Discharge status: Home / Self Care | Payer: Medicare HMO | Source: Ambulatory Visit | Attending: Family | Admitting: Family

## 2020-01-28 DIAGNOSIS — I2510 Atherosclerotic heart disease of native coronary artery without angina pectoris: Secondary | ICD-10-CM | POA: Insufficient documentation

## 2020-01-28 DIAGNOSIS — I110 Hypertensive heart disease with heart failure: Secondary | ICD-10-CM | POA: Diagnosis not present

## 2020-01-28 DIAGNOSIS — I5022 Chronic systolic (congestive) heart failure: Secondary | ICD-10-CM | POA: Insufficient documentation

## 2020-01-28 DIAGNOSIS — I428 Other cardiomyopathies: Secondary | ICD-10-CM | POA: Insufficient documentation

## 2020-01-28 DIAGNOSIS — I081 Rheumatic disorders of both mitral and tricuspid valves: Secondary | ICD-10-CM | POA: Diagnosis not present

## 2020-01-28 NOTE — Progress Notes (Signed)
*  PRELIMINARY RESULTS* Echocardiogram 2D Echocardiogram has been performed.  Sherrie Sport 01/28/2020, 11:46 AM

## 2020-02-04 ENCOUNTER — Ambulatory Visit: Payer: Medicare HMO | Attending: Internal Medicine

## 2020-02-04 DIAGNOSIS — Z23 Encounter for immunization: Secondary | ICD-10-CM | POA: Insufficient documentation

## 2020-02-04 NOTE — Progress Notes (Signed)
   Covid-19 Vaccination Clinic  Name:  Earl Hays    MRN: [id number] DOB: 1941-04-16  02/04/2020  [name] was observed post Covid-19 immunization for 15 minutes without incidence. He was provided with Vaccine Information Sheet and instruction to access the V-Safe system.   [name] was instructed to call 911 with any severe reactions post vaccine: Marland Kitchen Difficulty breathing  . Swelling of your face and throat  . A fast heartbeat  . A bad rash all over your body  . Dizziness and weakness    Immunizations Administered    Name Date Dose VIS Date Route   Moderna COVID-19 Vaccine 02/04/2020 12:19 PM 0.5 mL 11/23/2019 Intramuscular   Manufacturer: Moderna   Lot: [id number]   [id number]

## 2020-02-07 ENCOUNTER — Other Ambulatory Visit
Admission: RE | Admit: 2020-02-07 | Discharge: 2020-02-07 | Disposition: A | Discharge status: Home / Self Care | Payer: Medicare HMO | Source: Ambulatory Visit | Attending: Internal Medicine | Admitting: Internal Medicine

## 2020-02-07 ENCOUNTER — Other Ambulatory Visit: Payer: Self-pay

## 2020-02-07 DIAGNOSIS — Z01812 Encounter for preprocedural laboratory examination: Secondary | ICD-10-CM | POA: Insufficient documentation

## 2020-02-07 DIAGNOSIS — Z20822 Contact with and (suspected) exposure to covid-19: Secondary | ICD-10-CM | POA: Diagnosis not present

## 2020-02-08 ENCOUNTER — Encounter: Payer: Self-pay | Admitting: Internal Medicine

## 2020-02-08 LAB — SARS CORONAVIRUS 2 (TAT 6-24 HRS): SARS Coronavirus 2: NEGATIVE

## 2020-02-09 ENCOUNTER — Ambulatory Visit: Payer: Medicare HMO | Admitting: Anesthesiology

## 2020-02-09 ENCOUNTER — Encounter: Payer: Self-pay | Admitting: Internal Medicine

## 2020-02-09 ENCOUNTER — Ambulatory Visit
Admission: RE | Admit: 2020-02-09 | Discharge: 2020-02-09 | Disposition: A | Discharge status: Home / Self Care | Payer: Medicare HMO | Attending: Internal Medicine | Admitting: Internal Medicine

## 2020-02-09 ENCOUNTER — Encounter
Admission: RE | Disposition: A | Discharge status: Home / Self Care | Payer: Self-pay | Source: Home / Self Care | Attending: Internal Medicine

## 2020-02-09 ENCOUNTER — Other Ambulatory Visit: Payer: Self-pay

## 2020-02-09 ENCOUNTER — Telehealth: Payer: Self-pay | Admitting: Internal Medicine

## 2020-02-09 DIAGNOSIS — I110 Hypertensive heart disease with heart failure: Secondary | ICD-10-CM | POA: Insufficient documentation

## 2020-02-09 DIAGNOSIS — D122 Benign neoplasm of ascending colon: Secondary | ICD-10-CM | POA: Insufficient documentation

## 2020-02-09 DIAGNOSIS — K641 Second degree hemorrhoids: Secondary | ICD-10-CM | POA: Insufficient documentation

## 2020-02-09 DIAGNOSIS — K7460 Unspecified cirrhosis of liver: Secondary | ICD-10-CM | POA: Insufficient documentation

## 2020-02-09 DIAGNOSIS — I2510 Atherosclerotic heart disease of native coronary artery without angina pectoris: Secondary | ICD-10-CM | POA: Diagnosis not present

## 2020-02-09 DIAGNOSIS — I509 Heart failure, unspecified: Secondary | ICD-10-CM | POA: Diagnosis not present

## 2020-02-09 DIAGNOSIS — K449 Diaphragmatic hernia without obstruction or gangrene: Secondary | ICD-10-CM | POA: Diagnosis not present

## 2020-02-09 DIAGNOSIS — Z1211 Encounter for screening for malignant neoplasm of colon: Secondary | ICD-10-CM | POA: Diagnosis not present

## 2020-02-09 DIAGNOSIS — E785 Hyperlipidemia, unspecified: Secondary | ICD-10-CM | POA: Insufficient documentation

## 2020-02-09 DIAGNOSIS — K219 Gastro-esophageal reflux disease without esophagitis: Secondary | ICD-10-CM | POA: Insufficient documentation

## 2020-02-09 DIAGNOSIS — Z86010 Personal history of colonic polyps: Secondary | ICD-10-CM | POA: Diagnosis not present

## 2020-02-09 HISTORY — PX: ESOPHAGOGASTRODUODENOSCOPY (EGD) WITH PROPOFOL: SHX5813

## 2020-02-09 HISTORY — PX: COLONOSCOPY WITH PROPOFOL: SHX5780

## 2020-02-09 HISTORY — DX: Gastro-esophageal laceration-hemorrhage syndrome: K22.6

## 2020-02-09 HISTORY — DX: Anemia, unspecified: D64.9

## 2020-02-09 SURGERY — COLONOSCOPY WITH PROPOFOL
Anesthesia: General

## 2020-02-09 MED ORDER — PROPOFOL 500 MG/50ML IV EMUL
INTRAVENOUS | Status: DC | PRN
  Administered 2020-02-09: 145 ug/kg/min via INTRAVENOUS

## 2020-02-09 MED ORDER — PROPOFOL 10 MG/ML IV BOLUS
INTRAVENOUS | Status: DC | PRN
  Administered 2020-02-09: 30 mg via INTRAVENOUS

## 2020-02-09 MED ORDER — EPHEDRINE SULFATE 50 MG/ML IJ SOLN
INTRAMUSCULAR | Status: DC | PRN
  Administered 2020-02-09: 10 mg via INTRAVENOUS

## 2020-02-09 MED ORDER — SODIUM CHLORIDE 0.9 % IV SOLN: INTRAVENOUS | Status: DC

## 2020-02-09 MED ORDER — PROPOFOL 500 MG/50ML IV EMUL
INTRAVENOUS | Status: AC
  Filled 2020-02-09: qty 50

## 2020-02-09 MED ORDER — PHENYLEPHRINE HCL (PRESSORS) 10 MG/ML IV SOLN
INTRAVENOUS | Status: DC | PRN
  Administered 2020-02-09: 100 ug via INTRAVENOUS

## 2020-02-09 NOTE — Op Note (Signed)
[facility] Patient Name: Earl Hays Procedure Date: 02/09/2020 8:41 AM MRN: [id number] Account #: [id number] Date of Birth: Apr 29, 1941 Admit Type: Outpatient Age: 79 Room: [facility] ENDO ROOM 3 Gender: Male Note Status: Finalized Procedure:             Colonoscopy Indications:           Surveillance: Personal history of adenomatous polyps                         on last colonoscopy > 5 years ago Providers:             Lorie Apley K. Niquita Digioia MD, MD Medicines:             Propofol per Anesthesia Complications:         No immediate complications. Procedure:             Pre-Anesthesia Assessment:                        - The risks and benefits of the procedure and the                         sedation options and risks were discussed with the                         patient. All questions were answered and informed                         consent was obtained.                        - Patient identification and proposed procedure were                         verified prior to the procedure by the nurse. The                         procedure was verified in the procedure room.                        - ASA Grade Assessment: III - A patient with severe                         systemic disease.                        - After reviewing the risks and benefits, the patient                         was deemed in satisfactory condition to undergo the                         procedure.                        After obtaining informed consent, the colonoscope was                         passed under direct vision. Throughout the procedure,  the patient's blood pressure, pulse, and oxygen                         saturations were monitored continuously. The                         Colonoscope was introduced through the anus and                         advanced to the the cecum, identified by appendiceal                         orifice and ileocecal  valve. The colonoscopy was                         performed without difficulty. The patient tolerated                         the procedure well. The quality of the bowel                         preparation was adequate. The ileocecal valve,                         appendiceal orifice, and rectum were photographed. Findings:      The perianal and digital rectal examinations were normal. Pertinent       negatives include normal sphincter tone and no palpable rectal lesions.      Non-bleeding internal hemorrhoids were found during retroflexion. The       hemorrhoids were small and Grade II (internal hemorrhoids that prolapse       but reduce spontaneously).      A 8 mm polyp was found in the ascending colon. The polyp was sessile.       The polyp was removed with a cold snare. Resection and retrieval were       complete. To prevent bleeding after the polypectomy, one hemostatic clip       was successfully placed (MR conditional). There was no bleeding at the       end of the procedure.      The exam was otherwise without abnormality. Impression:            - Non-bleeding internal hemorrhoids.                        - One 8 mm polyp in the ascending colon, removed with                         a cold snare. Resected and retrieved. Clip (MR                         conditional) was placed.                        - The examination was otherwise normal. Recommendation:        - Patient has a contact number available for                         emergencies. The signs and symptoms of potential  delayed complications were discussed with the patient.                         Return to normal activities tomorrow. Written                         discharge instructions were provided to the patient.                        - Resume previous diet.                        - Continue present medications.                        - Contiune medical management of GERD given hiatal                          hernia noted on today's EGD.                        - Return to physician assistant in 1 year.                        - The findings and recommendations were discussed with                         the patient. Procedure Code(s):     --- Professional ---                        [phone], Colonoscopy, flexible; with removal of                         tumor(s), polyp(s), or other lesion(s) by snare                         technique Diagnosis Code(s):     --- Professional ---                        K64.1, Second degree hemorrhoids                        K63.5, Polyp of colon                        Z86.010, Personal history of colonic polyps CPT copyright 2019 American Medical Association. All rights reserved. The codes documented in this report are preliminary and upon coder review may  be revised to meet current compliance requirements. Efrain Sella MD, MD 02/09/2020 9:19:35 AM This report has been signed electronically. Number of Addenda: 0 Note Initiated On: 02/09/2020 8:41 AM Scope Withdrawal Time: 0 hours 9 minutes 12 seconds  Total Procedure Duration: 0 hours 14 minutes 46 seconds  Estimated Blood Loss:  Estimated blood loss: none. Estimated blood loss was                         minimal.      [facility]

## 2020-02-09 NOTE — Interval H&P Note (Signed)
History and Physical Interval Note:  02/09/2020 8:53 AM  Earl Hays  has presented today for surgery, with the diagnosis of HX POLYPS GERD.  The various methods of treatment have been discussed with the patient and family. After consideration of risks, benefits and other options for treatment, the patient has consented to  Procedure(s): COLONOSCOPY WITH PROPOFOL (N/A) ESOPHAGOGASTRODUODENOSCOPY (EGD) WITH PROPOFOL (N/A) as a surgical intervention.  The patient's history has been reviewed, patient examined, no change in status, stable for surgery.  I have reviewed the patient's chart and labs.  Questions were answered to the patient's satisfaction.     [place], Ethan

## 2020-02-09 NOTE — Op Note (Signed)
[facility] Patient Name: Earl Hays Procedure Date: 02/09/2020 8:41 AM MRN: [id number] Account #: [id number] Date of Birth: 03-30-1941 Admit Type: Outpatient Age: 79 Room: [facility] ENDO ROOM 3 Gender: Male Note Status: Finalized Procedure:             Upper GI endoscopy Indications:           Gastro-esophageal reflux disease Providers:             Benay Pike. Lydiann Bonifas MD, MD Medicines:             Propofol per Anesthesia Complications:         No immediate complications. Procedure:             Pre-Anesthesia Assessment:                        - The risks and benefits of the procedure and the                         sedation options and risks were discussed with the                         patient. All questions were answered and informed                         consent was obtained.                        - Patient identification and proposed procedure were                         verified prior to the procedure by the nurse. The                         procedure was verified in the procedure room.                        - ASA Grade Assessment: III - A patient with severe                         systemic disease.                        - After reviewing the risks and benefits, the patient                         was deemed in satisfactory condition to undergo the                         procedure.                        After obtaining informed consent, the endoscope was                         passed under direct vision. Throughout the procedure,                         the patient's blood pressure, pulse, and oxygen  saturations were monitored continuously. The Endoscope                         was introduced through the mouth, and advanced to the                         third part of duodenum. The upper GI endoscopy was                         accomplished without difficulty. The patient tolerated                         the  procedure well. Findings:      The Z-line was regular and was found at the gastroesophageal junction.      A 3 cm hiatal hernia was present.      The examined duodenum was normal.      The exam was otherwise without abnormality. Impression:            - Z-line regular, at the gastroesophageal junction.                        - 3 cm hiatal hernia.                        - Normal examined duodenum.                        - The examination was otherwise normal.                        - No specimens collected. Recommendation:        - Proceed with colonoscopy Procedure Code(s):     --- Professional ---                        [phone], Esophagogastroduodenoscopy, flexible,                         transoral; diagnostic, including collection of                         specimen(s) by brushing or washing, when performed                         (separate procedure) Diagnosis Code(s):     --- Professional ---                        K44.9, Diaphragmatic hernia without obstruction or                         gangrene                        K21.9, Gastro-esophageal reflux disease without                         esophagitis CPT copyright 2019 American Medical Association. All rights reserved. The codes documented in this report are preliminary and upon coder review may  be revised to meet current compliance requirements. Efrain Sella MD, MD 02/09/2020 8:58:45 AM This report has  been signed electronically. Number of Addenda: 0 Note Initiated On: 02/09/2020 8:41 AM Estimated Blood Loss:  Estimated blood loss: none.      [facility]

## 2020-02-09 NOTE — Anesthesia Preprocedure Evaluation (Addendum)
Anesthesia Evaluation  Patient identified by MRN, date of birth, ID band Patient awake    Reviewed: Allergy & Precautions, H&P , NPO status , Patient's Chart, lab work & pertinent test results  Airway Mallampati: II  TM Distance: >3 FB     Dental  (+) Upper Dentures, Lower Dentures   Pulmonary neg COPD, neg recent URI, former smoker,           Cardiovascular hypertension, (-) angina+ CAD and +CHF  (-) Past MI   1. Left ventricular ejection fraction, by visual estimation, is 45 to  50%. The left ventricle has mildly decreased function. There is no left  ventricular hypertrophy.  2. Left ventricular diastolic parameters are consistent with Grade I  diastolic dysfunction (impaired relaxation).  3. The left ventricle demonstrates global hypokinesis.  4. Global right ventricle has normal systolic function.The right  ventricular size is normal. No increase in right ventricular wall  thickness.  5. Left atrial size was normal.  6. Normal pulmonary artery systolic pressure.    Neuro/Psych negative neurological ROS  negative psych ROS   GI/Hepatic GERD  Controlled,(+) Cirrhosis   Esophageal Varices    ,   Endo/Other  negative endocrine ROS  Renal/GU negative Renal ROS  negative genitourinary   Musculoskeletal   Abdominal   Peds  Hematology  (+) Blood dyscrasia, anemia ,   Anesthesia Other Findings Past Medical History: No date: Anemia No date: CHF (congestive heart failure) (HCC) No date: Colon adenoma No date: Coronary artery disease     Comment:  a. 12/2017 Cath: LM min irregs, LAD 40ost, 68m, RI 60,               LCX 76m, RCA large, nl. No date: Eczema No date: GERD (gastroesophageal reflux disease) No date: HFrEF (heart failure with reduced ejection fraction) ([place])     Comment:  a. 12/2017 Echo: EF 30-35%, gr2 DD. Mild to mod MR. Mod               dil LA. Nl RV fxn. Mildly dil RA. PASP 27mmHg; b. 04/2018              Echo: EF 30-35%, diff HK. Nl RV fxn. No date: Hyperlipidemia No date: Hypertension No date: Mallory-Weiss tear     Comment:  with upper gi bleed 12/2013 No date: NICM (nonischemic cardiomyopathy) ([place])     Comment:  a. 12/2017 Echo: EF 30-35%, gr2 DD; b. 04/2018 Echo: EF               30-35%. No date: Varices, esophageal ([place])  Past Surgical History: No date: CARDIAC CATHETERIZATION 01/21/2017: CATARACT EXTRACTION W/PHACO; Left     Comment:  Procedure: CATARACT EXTRACTION PHACO AND INTRAOCULAR               LENS PLACEMENT (IOC);  Surgeon: Birder Robson, MD;                Location: [facility] ORS;  Service: Ophthalmology;  Laterality:              Left;  [place] 00:48 AP% 21.6 CDE 10.39 Fluid pack lot #               [id number] 02/11/2017: CATARACT EXTRACTION W/PHACO; Right     Comment:  Procedure: CATARACT EXTRACTION PHACO AND INTRAOCULAR               LENS PLACEMENT (IOC);  Surgeon: Birder Robson, MD;  Location: [facility] ORS;  Service: Ophthalmology;  Laterality:              Right;  [place] 00:42 AP% 22.4 CDE 9.47 Fluid pack lot #               [id number] No date: COLONOSCOPY No date: ESOPHAGOGASTRODUODENOSCOPY No date: HERNIA REPAIR No date: JOINT REPLACEMENT     Comment:  TKR No date: PROSTATE SURGERY No date: REPLACEMENT TOTAL KNEE BILATERAL; Bilateral 12/31/2017: RIGHT/LEFT HEART CATH AND CORONARY ANGIOGRAPHY; N/A     Comment:  Procedure: RIGHT/LEFT HEART CATH AND CORONARY               ANGIOGRAPHY;  Surgeon: Nelva Bush, MD;  Location:               [place] [facility];  Service: Cardiovascular;                Laterality: N/A;     Reproductive/Obstetrics negative OB ROS                            Anesthesia Physical Anesthesia Plan  ASA: III  Anesthesia Plan: General   Post-op Pain Management:    Induction:   PONV Risk Score and Plan: Propofol infusion and TIVA  Airway Management Planned: Natural Airway and Nasal  Cannula  Additional Equipment:   Intra-op Plan:   Post-operative Plan:   Informed Consent: I have reviewed the patients History and Physical, chart, labs and discussed the procedure including the risks, benefits and alternatives for the proposed anesthesia with the patient or authorized representative who has indicated his/her understanding and acceptance.     Dental Advisory Given  Plan Discussed with: Anesthesiologist  Anesthesia Plan Comments:         Anesthesia Quick Evaluation

## 2020-02-09 NOTE — H&P (Signed)
Outpatient short stay form Pre-procedure 02/09/2020 8:52 AM Earl Hays K. Alice Reichert, M.D.  Primary Physician: Emily Filbert, M.D.  Reason for visit:  GERD, Personal hx of colon polyps (TA in 2003)  History of present illness: patient has hx of uncomplicated GERD.                           Patient presents for colonoscopy for a personal hx of colon polyps. The patient denies abdominal pain, abnormal weight loss or rectal bleeding.      Current Facility-Administered Medications:  .  0.9 %  sodium chloride infusion, , Intravenous, Continuous, [place], Benay Pike, MD, Last Rate: 20 mL/hr at 02/09/20 0819, New Bag at 02/09/20 [id number]  Facility-Administered Medications Ordered in Other Encounters:  .  propofol (DIPRIVAN) 500 MG/50ML infusion, , Intravenous, Continuous PRN, Fredderick Phenix, CRNA, Last Rate: 69.078 mL/hr at 02/09/20 0851, 145 mcg/kg/min at 02/09/20 0851  Medications Prior to Admission  Medication Sig Dispense Refill Last Dose  . aspirin EC 81 MG tablet Take 81 mg by mouth daily.   02/08/2020 at Unknown time  . atorvastatin (LIPITOR) 40 MG tablet Take 40 mg by mouth daily.   02/08/2020 at Unknown time  . betamethasone dipropionate (DIPROLENE) 0.05 % ointment Apply topically as needed.     . carvedilol (COREG) 6.25 MG tablet Take 1 tablet (6.25 mg total) by mouth 2 (two) times daily. 180 tablet 3 02/08/2020 at Unknown time  . COD LIVER OIL PO Take by mouth daily.   02/08/2020 at Unknown time  . Cyanocobalamin 3000 MCG SUBL Place 3,000 mcg under the tongue once a week.    02/08/2020 at Unknown time  . Dupilumab, Asthma, (DUPIXENT [place]) Inject 300 mg into the skin every 14 (fourteen) days.   Past Month at Unknown time  . famotidine (PEPCID) 40 MG tablet Take 40 mg by mouth daily.   02/08/2020 at Unknown time  . folic acid (FOLVITE) 1 MG tablet Take 1 mg by mouth daily.   02/08/2020 at Unknown time  . furosemide (LASIX) 40 MG tablet Take 1 tablet (40 mg) by mouth daily for 1 week then decrease to 0.5  tablet (20 mg) by mouth once a day. 90 tablet 2 02/08/2020 at Unknown time  . losartan (COZAAR) 25 MG tablet Take 25 mg by mouth daily.   02/08/2020 at Unknown time  . montelukast (SINGULAIR) 10 MG tablet Take 10 mg by mouth at bedtime.   02/08/2020 at Unknown time  . Omega-3 Fatty Acids (FISH OIL) 1000 MG CAPS Take by mouth daily.   02/08/2020 at Unknown time  . pantoprazole (PROTONIX) 40 MG tablet Take 40 mg by mouth daily.    02/08/2020 at Unknown time  . sacubitril-valsartan (ENTRESTO) 24-26 MG Take 1 tablet by mouth 2 (two) times daily.   02/08/2020 at Unknown time  . spironolactone (ALDACTONE) 25 MG tablet Take 1 tablet (25 mg total) by mouth daily. 90 tablet 3 02/08/2020 at Unknown time  . atorvastatin (LIPITOR) 20 MG tablet Take 1 tablet (20 mg total) by mouth daily at 6 PM. 90 tablet 3   . clobetasol ointment (TEMOVATE) [id number] % Apply 1 application topically 2 (two) times daily.   Not Taking at Unknown time  . Docosahexaenoic Acid 100 MG CAPS Take by mouth daily.         Allergies  Allergen Reactions  . Lamisil [Terbinafine] Itching and Rash     Past Medical History:  Diagnosis Date  .  Anemia   . CHF (congestive heart failure) ([place])   . Colon adenoma   . Coronary artery disease    a. 12/2017 Cath: LM min irregs, LAD 40ost, 37m, RI 60, LCX 71m, RCA large, nl.  . Eczema   . GERD (gastroesophageal reflux disease)   . HFrEF (heart failure with reduced ejection fraction) ([place])    a. 12/2017 Echo: EF 30-35%, gr2 DD. Mild to mod MR. Mod dil LA. Nl RV fxn. Mildly dil RA. PASP 60mmHg; b. 04/2018 Echo: EF 30-35%, diff HK. Nl RV fxn.  . Hyperlipidemia   . Hypertension   . Mallory-Weiss tear    with upper gi bleed 12/2013  . NICM (nonischemic cardiomyopathy) ([place])    a. 12/2017 Echo: EF 30-35%, gr2 DD; b. 04/2018 Echo: EF 30-35%.  . Varices, esophageal ([place])     Review of systems:  Otherwise negative.    Physical Exam  Gen: Alert, oriented. Appears stated age.  HEENT: [place]/AT.  PERRLA. Lungs: CTA, no wheezes. CV: RR nl S1, S2. Abd: soft, benign, no masses. BS+ Ext: No edema. Pulses 2+    Planned procedures: Proceed with EGD and colonoscopy. The patient understands the nature of the planned procedure, indications, risks, alternatives and potential complications including but not limited to bleeding, infection, perforation, damage to internal organs and possible oversedation/side effects from anesthesia. The patient agrees and gives consent to proceed.  Please refer to procedure notes for findings, recommendations and patient disposition/instructions.     Earl Hays K. Alice Reichert, M.D. Gastroenterology 02/09/2020  8:52 AM

## 2020-02-09 NOTE — Anesthesia Postprocedure Evaluation (Signed)
Anesthesia Post Note  Patient: Earl Hays  Procedure(s) Performed: COLONOSCOPY WITH PROPOFOL (N/A ) ESOPHAGOGASTRODUODENOSCOPY (EGD) WITH PROPOFOL (N/A )  Patient location during evaluation: PACU Anesthesia Type: General Level of consciousness: awake and alert Pain management: pain level controlled Vital Signs Assessment: post-procedure vital signs reviewed and stable Respiratory status: spontaneous breathing, nonlabored ventilation and respiratory function stable Cardiovascular status: blood pressure returned to baseline and stable Postop Assessment: no apparent nausea or vomiting Anesthetic complications: no     Last Vitals:  Vitals:   02/09/20 0939 02/09/20 0958  BP: 103/68 118/81  Pulse: 76 73  Resp: 16 17  Temp:    SpO2: 100% 100%    Last Pain:  Vitals:   02/09/20 0958  TempSrc:   PainSc: 0-No pain                 Tera Mater

## 2020-02-09 NOTE — Telephone Encounter (Signed)

## 2020-02-09 NOTE — Transfer of Care (Signed)
Immediate Anesthesia Transfer of Care Note  Patient: Earl Hays  Procedure(s) Performed: COLONOSCOPY WITH PROPOFOL (N/A ) ESOPHAGOGASTRODUODENOSCOPY (EGD) WITH PROPOFOL (N/A )  Patient Location: PACU  Anesthesia Type:General  Level of Consciousness: awake and drowsy  Airway & Oxygen Therapy: Patient Spontanous Breathing and Patient connected to nasal cannula oxygen  Post-op Assessment: Report given to RN and Post -op Vital signs reviewed and stable  Post vital signs: Reviewed and stable  Last Vitals:  Vitals Value Taken Time  BP 86/61 02/09/20 0923  Temp 36 C 02/09/20 0919  Pulse 84 02/09/20 0925  Resp 18 02/09/20 0925  SpO2 97 % 02/09/20 0925  Vitals shown include unvalidated device data.  Last Pain:  Vitals:   02/09/20 0919  TempSrc: Temporal  PainSc: 0-No pain         Complications: No apparent anesthesia complications

## 2020-02-09 NOTE — Interval H&P Note (Deleted)
History and Physical Interval Note:  02/09/2020 8:41 AM  Earl Hays  has presented today for surgery, with the diagnosis of HX POLYPS GERD.  The various methods of treatment have been discussed with the patient and family. After consideration of risks, benefits and other options for treatment, the patient has consented to  Procedure(s): COLONOSCOPY WITH PROPOFOL (N/A) ESOPHAGOGASTRODUODENOSCOPY (EGD) WITH PROPOFOL (N/A) as a surgical intervention.  The patient's history has been reviewed, patient examined, no change in status, stable for surgery.  I have reviewed the patient's chart and labs.  Questions were answered to the patient's satisfaction.     [place], [place]

## 2020-02-09 NOTE — H&P (Deleted)
Outpatient short stay form Pre-procedure 02/09/2020 8:40 AM Earl Hays, M.D.  Primary Physician: Emily Filbert, M.D.  Reason for visit: Personal hx of adenomatous colon polyps > 10 years prior.   History of present illness:                            Patient presents for colonoscopy for a personal hx of colon polyps. The patient denies abdominal pain, abnormal weight loss or rectal bleeding.      Current Facility-Administered Medications:  .  0.9 %  sodium chloride infusion, , Intravenous, Continuous, [place], Benay Pike, MD, Last Rate: 20 mL/hr at 02/09/20 0819, New Bag at 02/09/20 0819  Medications Prior to Admission  Medication Sig Dispense Refill Last Dose  . aspirin EC 81 MG tablet Take 81 mg by mouth daily.   02/08/2020 at Unknown time  . atorvastatin (LIPITOR) 40 MG tablet Take 40 mg by mouth daily.   02/08/2020 at Unknown time  . betamethasone dipropionate (DIPROLENE) 0.05 % ointment Apply topically as needed.     . carvedilol (COREG) 6.25 MG tablet Take 1 tablet (6.25 mg total) by mouth 2 (two) times daily. 180 tablet 3 02/08/2020 at Unknown time  . COD LIVER OIL PO Take by mouth daily.   02/08/2020 at Unknown time  . Cyanocobalamin 3000 MCG SUBL Place 3,000 mcg under the tongue once a week.    02/08/2020 at Unknown time  . Dupilumab, Asthma, (DUPIXENT [place]) Inject 300 mg into the skin every 14 (fourteen) days.   Past Month at Unknown time  . famotidine (PEPCID) 40 MG tablet Take 40 mg by mouth daily.   02/08/2020 at Unknown time  . folic acid (FOLVITE) 1 MG tablet Take 1 mg by mouth daily.   02/08/2020 at Unknown time  . furosemide (LASIX) 40 MG tablet Take 1 tablet (40 mg) by mouth daily for 1 week then decrease to 0.5 tablet (20 mg) by mouth once a day. 90 tablet 2 02/08/2020 at Unknown time  . losartan (COZAAR) 25 MG tablet Take 25 mg by mouth daily.   02/08/2020 at Unknown time  . montelukast (SINGULAIR) 10 MG tablet Take 10 mg by mouth at bedtime.   02/08/2020 at Unknown time  .  Omega-3 Fatty Acids (FISH OIL) 1000 MG CAPS Take by mouth daily.   02/08/2020 at Unknown time  . pantoprazole (PROTONIX) 40 MG tablet Take 40 mg by mouth daily.    02/08/2020 at Unknown time  . sacubitril-valsartan (ENTRESTO) 24-26 MG Take 1 tablet by mouth 2 (two) times daily.   02/08/2020 at Unknown time  . spironolactone (ALDACTONE) 25 MG tablet Take 1 tablet (25 mg total) by mouth daily. 90 tablet 3 02/08/2020 at Unknown time  . atorvastatin (LIPITOR) 20 MG tablet Take 1 tablet (20 mg total) by mouth daily at 6 PM. 90 tablet 3   . clobetasol ointment (TEMOVATE) [id number] % Apply 1 application topically 2 (two) times daily.   Not Taking at Unknown time  . Docosahexaenoic Acid 100 MG CAPS Take by mouth daily.         Allergies  Allergen Reactions  . Lamisil [Terbinafine] Itching and Rash     Past Medical History:  Diagnosis Date  . Anemia   . CHF (congestive heart failure) ([employer])   . Colon adenoma   . Coronary artery disease    a. 12/2017 Cath: LM min irregs, LAD 40ost, 21m, RI 60, LCX 13m, RCA large,  nl.  . Eczema   . GERD (gastroesophageal reflux disease)   . HFrEF (heart failure with reduced ejection fraction) ([place])    a. 12/2017 Echo: EF 30-35%, gr2 DD. Mild to mod MR. Mod dil LA. Nl RV fxn. Mildly dil RA. PASP 32mmHg; b. 04/2018 Echo: EF 30-35%, diff HK. Nl RV fxn.  . Hyperlipidemia   . Hypertension   . Mallory-Weiss tear    with upper gi bleed 12/2013  . NICM (nonischemic cardiomyopathy) ([place])    a. 12/2017 Echo: EF 30-35%, gr2 DD; b. 04/2018 Echo: EF 30-35%.  . Varices, esophageal ([place])     Review of systems:  Otherwise negative.    Physical Exam  Gen: Alert, oriented. Appears stated age.  HEENT: [place]/AT. PERRLA. Lungs: CTA, no wheezes. CV: RR nl S1, S2. Abd: soft, benign, no masses. BS+ Ext: No edema. Pulses 2+    Planned procedures: Proceed with colonoscopy. The patient understands the nature of the planned procedure, indications, risks, alternatives and potential  complications including but not limited to bleeding, infection, perforation, damage to internal organs and possible oversedation/side effects from anesthesia. The patient agrees and gives consent to proceed.  Please refer to procedure notes for findings, recommendations and patient disposition/instructions.     Earl Hays K. [name], M.D. Gastroenterology 02/09/2020  8:40 AM

## 2020-02-10 ENCOUNTER — Telehealth (INDEPENDENT_AMBULATORY_CARE_PROVIDER_SITE_OTHER): Payer: Medicare HMO | Admitting: Internal Medicine

## 2020-02-10 ENCOUNTER — Encounter: Payer: Self-pay | Admitting: Internal Medicine

## 2020-02-10 VITALS — BP 118/81 | HR 76 | Ht 70.0 in | Wt 175.0 lb

## 2020-02-10 DIAGNOSIS — I5022 Chronic systolic (congestive) heart failure: Secondary | ICD-10-CM | POA: Diagnosis not present

## 2020-02-10 DIAGNOSIS — I2510 Atherosclerotic heart disease of native coronary artery without angina pectoris: Secondary | ICD-10-CM

## 2020-02-10 DIAGNOSIS — I428 Other cardiomyopathies: Secondary | ICD-10-CM

## 2020-02-10 LAB — SURGICAL PATHOLOGY

## 2020-02-10 NOTE — Patient Instructions (Signed)
Medication Instructions:  Your physician recommends that you continue on your current medications as directed. Please refer to the Current Medication list given to you today.  *If you need a refill on your cardiac medications before your next appointment, please call your pharmacy*  Lab Work: none If you have labs (blood work) drawn today and your tests are completely normal, you will receive your results only by: . MyChart Message (if you have MyChart) OR . A paper copy in the mail If you have any lab test that is abnormal or we need to change your treatment, we will call you to review the results.  Testing/Procedures: none  Follow-Up: At [facility], you and your health needs are our priority.  As part of our continuing mission to provide you with exceptional heart care, we have created designated Provider Care Teams.  These Care Teams include your primary Cardiologist (physician) and Advanced Practice Providers (APPs -  Physician Assistants and Nurse Practitioners) who all work together to provide you with the care you need, when you need it.  Your next appointment:   3 month(s)  The format for your next appointment:   In Person  Provider:    You may see Christopher End, MD or one of the following Advanced Practice Providers on your designated Care Team:    Christopher Berge, NP  Ryan Dunn, PA-C  Jacquelyn Visser, PA-C 

## 2020-02-10 NOTE — Progress Notes (Signed)
Virtual Visit via Telephone Note   This visit type was conducted due to national recommendations for restrictions regarding the COVID-19 Pandemic (e.g. social distancing) in an effort to limit this patient's exposure and mitigate transmission in our community.  Due to his co-morbid illnesses, this patient is at least at moderate risk for complications without adequate follow up.  This format is felt to be most appropriate for this patient at this time.  The patient did not have access to video technology/had technical difficulties with video requiring transitioning to audio format only (telephone).  All issues noted in this document were discussed and addressed.  No physical exam could be performed with this format.  Please refer to the patient's chart for his  consent to telehealth for [facility].   Date:  02/10/2020   ID:  Earl Hays, DOB 03-03-1941, MRN [id number]  Patient Location: Home Provider Location: Home  PCP:  Rusty Aus, MD  Cardiologist:  Nelva Bush, MD  Electrophysiologist:  None   Evaluation Performed:  Follow-Up Visit  Chief Complaint: Follow-up heart failure  History of Present Illness:    Earl Hays is a 79 y.o. 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  He was last seen in our office in 10/2019 by Earl Bayley, NP, at which time he reported less shortness of breath after brief escalation of furosemide at our prior visit in 09/2019.  He continued to have some chronic abdominal bloating.  No medication changes were made.  Today, Earl Hays reports that he is doing well.  He continues to some abdominal bloating, though it is not any worse than when he was last seen in our office.  He went upper and lower endoscopy yesterday which was notable for small hiatal hernia and 8 mm colonic  polyp that was removed.  He has not had any significant chest pain, shortness of breath, palpitations, lightheadedness, or edema.  He is tolerating his current medications well.  He received his first COVID-19 vaccine a few weeks ago. The patient does not have symptoms concerning for COVID-19 infection (fever, chills, cough, or new shortness of breath).    Past Medical History:  Diagnosis Date  . Anemia   . CHF (congestive heart failure) ([place])   . Colon adenoma   . Coronary artery disease    a. 12/2017 Cath: LM min irregs, LAD 40ost, 8m, RI 60, LCX 11m, RCA large, nl.  . Eczema   . GERD (gastroesophageal reflux disease)   . HFrEF (heart failure with reduced ejection fraction) ([place])    a. 12/2017 Echo: EF 30-35%, gr2 DD. Mild to mod MR. Mod dil LA. Nl RV fxn. Mildly dil RA. PASP 37mmHg; b. 04/2018 Echo: EF 30-35%, diff HK. Nl RV fxn.  . Hyperlipidemia   . Hypertension   . Mallory-Weiss tear    with upper gi bleed 12/2013  . NICM (nonischemic cardiomyopathy) ([place])    a. 12/2017 Echo: EF 30-35%, gr2 DD; b. 04/2018 Echo: EF 30-35%.  . Varices, esophageal ([place])    Past Surgical History:  Procedure Laterality Date  . CARDIAC CATHETERIZATION    . CATARACT EXTRACTION W/PHACO Left 01/21/2017   Procedure: CATARACT EXTRACTION PHACO AND INTRAOCULAR LENS PLACEMENT (IOC);  Surgeon: Birder Robson, MD;  Location: [facility] ORS;  Service: Ophthalmology;  Laterality: Left;  [place] 00:48 AP% 21.6 CDE 10.39 Fluid pack lot # [id number]  .  CATARACT EXTRACTION W/PHACO Right 02/11/2017   Procedure: CATARACT EXTRACTION PHACO AND INTRAOCULAR LENS PLACEMENT (IOC);  Surgeon: Birder Robson, MD;  Location: [facility] ORS;  Service: Ophthalmology;  Laterality: Right;  [place] 00:42 AP% 22.4 CDE 9.47 Fluid pack lot # [id number]  . COLONOSCOPY    . COLONOSCOPY WITH PROPOFOL N/A 02/09/2020   Procedure: COLONOSCOPY WITH PROPOFOL;  Surgeon: Toledo, Benay Pike, MD;  Location: [facility] ENDOSCOPY;  Service: Gastroenterology;  Laterality: N/A;  .  ESOPHAGOGASTRODUODENOSCOPY    . ESOPHAGOGASTRODUODENOSCOPY (EGD) WITH PROPOFOL N/A 02/09/2020   Procedure: ESOPHAGOGASTRODUODENOSCOPY (EGD) WITH PROPOFOL;  Surgeon: Toledo, Benay Pike, MD;  Location: [facility] ENDOSCOPY;  Service: Gastroenterology;  Laterality: N/A;  . HERNIA REPAIR    . JOINT REPLACEMENT     TKR  . PROSTATE SURGERY    . REPLACEMENT TOTAL KNEE BILATERAL Bilateral   . RIGHT/LEFT HEART CATH AND CORONARY ANGIOGRAPHY N/A 12/31/2017   Procedure: RIGHT/LEFT HEART CATH AND CORONARY ANGIOGRAPHY;  Surgeon: Nelva Bush, MD;  Location: [place] [facility];  Service: Cardiovascular;  Laterality: N/A;     Current Meds  Medication Sig  . aspirin EC 81 MG tablet Take 81 mg by mouth daily.  Marland Kitchen atorvastatin (LIPITOR) 20 MG tablet Take 1 tablet (20 mg total) by mouth daily at 6 PM.  . betamethasone dipropionate (DIPROLENE) 0.05 % ointment Apply topically as needed.  . carvedilol (COREG) 6.25 MG tablet Take 1 tablet (6.25 mg total) by mouth 2 (two) times daily.  . clobetasol ointment (TEMOVATE) [id number] % Apply 1 application topically 2 (two) times daily.  . COD LIVER OIL PO Take by mouth daily.  . Cyanocobalamin 3000 MCG SUBL Place 3,000 mcg under the tongue once a week.   . Docosahexaenoic Acid 100 MG CAPS Take by mouth daily.   . Dupilumab, Asthma, (DUPIXENT [place]) Inject 300 mg into the skin every 14 (fourteen) days.  . famotidine (PEPCID) 40 MG tablet Take 40 mg by mouth daily.  . folic acid (FOLVITE) 1 MG tablet Take 1 mg by mouth daily.  . furosemide (LASIX) 40 MG tablet Take 1 tablet (40 mg) by mouth daily for 1 week then decrease to 0.5 tablet (20 mg) by mouth once a day.  . montelukast (SINGULAIR) 10 MG tablet Take 10 mg by mouth at bedtime.  . Omega-3 Fatty Acids (FISH OIL) 1000 MG CAPS Take by mouth daily.  . pantoprazole (PROTONIX) 40 MG tablet Take 40 mg by mouth daily.   . sacubitril-valsartan (ENTRESTO) 24-26 MG Take 1 tablet by mouth 2 (two) times daily.  Marland Kitchen spironolactone  (ALDACTONE) 25 MG tablet Take 1 tablet (25 mg total) by mouth daily.  . [DISCONTINUED] losartan (COZAAR) 25 MG tablet Take 25 mg by mouth daily.     Allergies:   Lamisil [terbinafine]   Social History   Tobacco Use  . Smoking status: Former Smoker    Packs/day: 0.25    Years: 44.00    Pack years: 11.00    Types: Cigarettes    Quit date: 2004    Years since quitting: 17.1  . Smokeless tobacco: Never Used  . Tobacco comment: 03/23/04  Substance Use Topics  . Alcohol use: Not Currently  . Drug use: No     Family Hx: The patient's family history includes Brain cancer in his mother.  ROS:   Please see the history of present illness.   All other systems reviewed and are negative.   Prior CV studies:   The following studies were reviewed today:  Limited TTE (  04/29/2018): Mildly dilated LV.  LVEF 30-35% with diffuse hypokinesis.  Normal RV size and function.  No significant valvular abnormality.  R/LHC (12/31/2017): Nonobstructive CAD, including normal LMCA, 40% proximal LAD, 20% mid LAD, 60% ramus, and 20% mid LCx stenoses.  RA 10, RV 40/14, PA 40/21 (27), and PCWP 22 mmHg.  PA sat 74%.  Fick CO/CI 6.5/3.7.  LVEDP 25 mmHg.  Labs/Other Tests and Data Reviewed:    EKG:  No ECG reviewed.  Recent Labs: 11/05/2019: BUN 16; Creatinine, Ser 1.29; Potassium 4.0; Sodium 141   Recent Lipid Panel Lab Results  Component Value Date/Time   CHOL 153 10/07/2018 02:52 PM   TRIG 176 (H) 10/07/2018 02:52 PM   HDL 68 10/07/2018 02:52 PM   CHOLHDL 2.3 10/07/2018 02:52 PM   CHOLHDL 5.1 07/22/2018 11:07 AM   LDLCALC 50 10/07/2018 02:52 PM    Wt Readings from Last 3 Encounters:  02/10/20 175 lb (79.4 kg)  02/09/20 175 lb (79.4 kg)  01/13/20 183 lb 3.2 oz (83.1 kg)     Objective:    Vital Signs:  BP 118/81   Pulse 76   Ht 5\' 10"  (1.778 m)   Wt 175 lb (79.4 kg)   BMI 25.11 kg/m    VITAL SIGNS:  reviewed  ASSESSMENT & PLAN:    Chronic HFrEF due to nonischemic cardiomyopathy: Mr.  Betanzos continues to do well with NYHA class I-II symptoms.  LVEF has improved considerably with goal-directed medical therapy and is now only mildly reduced.  We will not make any medication changes at this time.  Nonobstructive coronary artery disease: No symptoms suggest worsening coronary insufficiency.  Continue current medications to prevent progression of disease.  Abdominal distention: Nonspecific; question if hiatal hernia may be contributing.  Ongoing management per GI.  COVID-19 Education: The signs and symptoms of COVID-19 were discussed with the patient and how to seek care for testing (follow up with PCP or arrange E-visit).  The importance of social distancing was discussed today.  Time:   Today, I have spent 10 minutes with the patient with telehealth technology discussing the above problems.     Medication Adjustments/Labs and Tests Ordered: Current medicines are reviewed at length with the patient today.  Concerns regarding medicines are outlined above.   Tests Ordered: No orders of the defined types were placed in this encounter.   Medication Changes: No orders of the defined types were placed in this encounter.   Follow Up:  In Person in 3 month(s)  Signed, Nelva Bush, MD  02/10/2020 2:21 PM    [facility]

## 2020-03-06 ENCOUNTER — Ambulatory Visit: Payer: Medicare HMO | Attending: Internal Medicine

## 2020-03-06 DIAGNOSIS — Z23 Encounter for immunization: Secondary | ICD-10-CM

## 2020-03-06 NOTE — Progress Notes (Signed)
   Covid-19 Vaccination Clinic  Name:  Earl Hays    MRN: [id number] DOB: [birth year]  03/06/2020  [name] was observed post Covid-19 immunization for 15 minutes without incident. He was provided with Vaccine Information Sheet and instruction to access the V-Safe system.   [name] was instructed to call 911 with any severe reactions post vaccine: Marland Kitchen Difficulty breathing  . Swelling of face and throat  . A fast heartbeat  . A bad rash all over body  . Dizziness and weakness   Immunizations Administered    Name Date Dose VIS Date Route   Moderna COVID-19 Vaccine 03/06/2020 12:08 PM 0.5 mL 11/23/2019 Intramuscular   Manufacturer: Moderna   Lot: [id number]   [id number]

## 2020-04-06 ENCOUNTER — Other Ambulatory Visit: Payer: Self-pay | Admitting: Gastroenterology

## 2020-04-06 DIAGNOSIS — R140 Abdominal distension (gaseous): Secondary | ICD-10-CM

## 2020-04-06 DIAGNOSIS — K449 Diaphragmatic hernia without obstruction or gangrene: Secondary | ICD-10-CM

## 2020-04-06 DIAGNOSIS — K219 Gastro-esophageal reflux disease without esophagitis: Secondary | ICD-10-CM

## 2020-04-06 DIAGNOSIS — R6881 Early satiety: Secondary | ICD-10-CM

## 2020-04-13 ENCOUNTER — Ambulatory Visit
Admission: RE | Admit: 2020-04-13 | Discharge: 2020-04-13 | Disposition: A | Discharge status: Home / Self Care | Payer: Medicare HMO | Source: Ambulatory Visit | Attending: Gastroenterology | Admitting: Gastroenterology

## 2020-04-13 ENCOUNTER — Other Ambulatory Visit: Payer: Self-pay

## 2020-04-13 DIAGNOSIS — K219 Gastro-esophageal reflux disease without esophagitis: Secondary | ICD-10-CM | POA: Insufficient documentation

## 2020-04-13 DIAGNOSIS — R6881 Early satiety: Secondary | ICD-10-CM | POA: Diagnosis present

## 2020-04-13 DIAGNOSIS — K449 Diaphragmatic hernia without obstruction or gangrene: Secondary | ICD-10-CM | POA: Diagnosis not present

## 2020-04-13 DIAGNOSIS — R140 Abdominal distension (gaseous): Secondary | ICD-10-CM | POA: Diagnosis present

## 2020-05-09 ENCOUNTER — Encounter: Payer: Self-pay | Admitting: Family

## 2020-05-09 ENCOUNTER — Other Ambulatory Visit: Payer: Self-pay

## 2020-05-09 ENCOUNTER — Ambulatory Visit (INDEPENDENT_AMBULATORY_CARE_PROVIDER_SITE_OTHER): Payer: Medicare HMO | Admitting: Family

## 2020-05-09 VITALS — BP 120/76 | HR 55 | Ht 70.0 in | Wt 184.5 lb

## 2020-05-09 DIAGNOSIS — I5022 Chronic systolic (congestive) heart failure: Secondary | ICD-10-CM | POA: Diagnosis not present

## 2020-05-09 DIAGNOSIS — I2510 Atherosclerotic heart disease of native coronary artery without angina pectoris: Secondary | ICD-10-CM

## 2020-05-09 DIAGNOSIS — E785 Hyperlipidemia, unspecified: Secondary | ICD-10-CM

## 2020-05-09 NOTE — Progress Notes (Signed)
Office Visit    Patient Name: CLABORN KROLCZYK Date of Encounter: 05/09/2020  Primary Care Provider:  Rusty Aus, MD Primary Cardiologist:  Nelva Bush, MD Electrophysiologist:  None   Chief Complaint    THAXTON Hays is a 79 y.o. 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 Anemia   . CHF (congestive heart failure) ([place])   . Colon adenoma   . Coronary artery disease    a. 12/2017 Cath: LM min irregs, LAD 40ost, 25m, RI 60, LCX 23m, RCA large, nl.  . Eczema   . GERD (gastroesophageal reflux disease)   . HFrEF (heart failure with reduced ejection fraction) ([place])    a. 12/2017 Echo: EF 30-35%, gr2 DD. Mild to mod MR. Mod dil LA. Nl RV fxn. Mildly dil RA. PASP 16mmHg; b. 04/2018 Echo: EF 30-35%, diff HK. Nl RV fxn.  . Hyperlipidemia   . Hypertension   . Mallory-Weiss tear    with upper gi bleed 12/2013  . NICM (nonischemic cardiomyopathy) ([place])    a. 12/2017 Echo: EF 30-35%, gr2 DD; b. 04/2018 Echo: EF 30-35%.  . Varices, esophageal ([place])    Past Surgical History:  Procedure Laterality Date  . CARDIAC CATHETERIZATION    . CATARACT EXTRACTION W/PHACO Left 01/21/2017   Procedure: CATARACT EXTRACTION PHACO AND INTRAOCULAR LENS PLACEMENT (IOC);  Surgeon: Birder Robson, MD;  Location: [facility] ORS;  Service: Ophthalmology;  Laterality: Left;  [place] 00:48 AP% 21.6 CDE 10.39 Fluid pack lot # [id number]  . CATARACT EXTRACTION W/PHACO Right 02/11/2017   Procedure: CATARACT EXTRACTION PHACO AND INTRAOCULAR LENS PLACEMENT (IOC);  Surgeon: Birder Robson, MD;  Location: [facility] ORS;  Service: Ophthalmology;  Laterality: Right;  [place] 00:42 AP% 22.4 CDE 9.47 Fluid pack lot # [id number]  . COLONOSCOPY    .  COLONOSCOPY WITH PROPOFOL N/A 02/09/2020   Procedure: COLONOSCOPY WITH PROPOFOL;  Surgeon: Toledo, Benay Pike, MD;  Location: [facility] END[facility];  Service: Gastroenterology;  Laterality: N/A;  . ESOPHAGOGASTRODUODENOSCOPY    . ESOPHAGOGASTRODUODENOSCOPY (EGD) WITH PROPOFOL N/A 02/09/2020   Procedure: ESOPHAGOGASTRODUODENOSCOPY (EGD) WITH PROPOFOL;  Surgeon: Toledo, Benay Pike, MD;  Location: [facility] END[facility];  Service: Gastroenterology;  Laterality: N/A;  . HERNIA REPAIR    . JOINT REPLACEMENT     TKR  . PROSTATE SURGERY    . REPLACEMENT TOTAL KNEE BILATERAL Bilateral   . RIGHT/LEFT HEART CATH AND CORONARY ANGIOGRAPHY N/A 12/31/2017   Procedure: RIGHT/LEFT HEART CATH AND CORONARY ANGIOGRAPHY;  Surgeon: Nelva Bush, MD;  Location: [place] CV LAB;  Service: Cardiovascular;  Laterality: N/A;    Allergies  Allergies  Allergen Reactions  . Lamisil [Terbinafine] Itching and Rash    History of Present Illness    Earl Hays is a 79 y.o. 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 Saunders Revel.  09/2019 his furosemide dose was escalated.  Follow-up 10/2019 he reported his shortness of breath has been improved.  Echocardiogram 01/28/2020 with LVEF [id number], grade 1 diastolic dysfunction, no significant valvular abnormalities.  During telemedicine visit 02/10/2020 he was noted to have continued NYHA class I-II  symptoms and current medications were maintained.  Works part time at [employer] as a Tourist information centre manager. Plays in senior league for golf which he enjoys. Generally plays golf one to two times per week.   Gets somewhat dyspneic with more than usual activity such as carrying a case of water into the house. Tells me this is stable. He does not get dyspneic while golfing or performing normal daily activities.   Reports no shortness of  breath at rest. Reports no chest pain, pressure, or tightness. No edema, orthopnea, PND. Reports no palpitations.   Chief complaint today is feeling of fullness.  He has been following with GI for small hiatal hernia and GERD.  He reports compliance with his pantoprazole and Pepcid.  He is avoiding fried foods, spicy foods.  We also discussed avoiding acidic but he tells me he does not eat many others.  He was encouraged to follow-up with GI and given their phone number. EKGs/Labs/Other Studies Reviewed:   The following studies were reviewed today:  Echo 01/2020  1. Left ventricular ejection fraction, by visual estimation, is 45 to  50%. The left ventricle has mildly decreased function. There is no left  ventricular hypertrophy.   2. Left ventricular diastolic parameters are consistent with Grade I  diastolic dysfunction (impaired relaxation).   3. The left ventricle demonstrates global hypokinesis.   4. Global right ventricle has normal systolic function.The right  ventricular size is normal. No increase in right ventricular wall  thickness.   5. Left atrial size was normal.   6. Normal pulmonary artery systolic pressure.   Cardiac cath 12/2017 Conclusions: 1. Mild to moderate, non-obstructive coronary artery disease. 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 2. Mildly to moderately elevated left and right heart pressures. 3. Normal Fick cardiac output.   Recommendations: 1. Continue diuresis; will switch furosemide to 40 mg PO daily. 2. Continue current dose of carvedilol; will add losartan 12.5 mg daily. 3. Medical therapy and risk factor modification to prevent progression of coronary artery disease. 4. If patient is hemodynamically stable and able to ambulate without difficulty, discharge this afternoon could be considered.  EKG:  EKG is ordered today.  The ekg ordered today demonstrates sinus bradycardia 55 bpm with no acute ST/T wave changes.  Recent  Labs: 11/05/2019: BUN 16; Creatinine, Ser 1.29; Potassium 4.0; Sodium 141  Recent Lipid Panel    Component Value Date/Time   CHOL 153 10/07/2018 1452   TRIG 176 (H) 10/07/2018 1452   HDL 68 10/07/2018 1452   CHOLHDL 2.3 10/07/2018 1452   CHOLHDL 5.1 07/22/2018 1107   VLDL 33 07/22/2018 1107   LDLCALC 50 10/07/2018 1452    Home Medications   No outpatient medications have been marked as taking for the 05/09/20 encounter (Appointment) with Loel Dubonnet, NP.      Review of Systems      Review of Systems  Constitution: Negative for chills, fever and malaise/fatigue.  Cardiovascular: Negative for chest pain, dyspnea on exertion, leg swelling, near-syncope, orthopnea, palpitations and syncope.  Respiratory: Negative for cough, shortness of breath and wheezing.   Gastrointestinal: Positive for bloating and heartburn. Negative for nausea and vomiting.  Neurological: Negative for dizziness, light-headedness and weakness.   All other systems reviewed and are otherwise negative except as noted above.  Physical Exam    VS:  There were no vitals taken for this visit. , BMI There is no height or weight on file to calculate BMI. GEN: Well nourished, well developed, in no acute  distress. HEENT: normal. Neck: Supple, no JVD, carotid bruits, or masses. Cardiac: RRR, no murmurs, rubs, or gallops. No clubbing, cyanosis, edema.  Radials/PT 2+ and equal bilaterally.  Respiratory:  Respirations regular and unlabored, clear to auscultation bilaterally. GI: Soft, nontender, nondistended, BS + x 4. MS: No deformity or atrophy. Skin: Warm and dry, no rash. Neuro:  Strength and sensation are intact. Psych: Normal affect.  Accessory Clinical Findings    ECG personally reviewed by me today -sinus bradycardia 55 bpm- no acute changes.  Assessment & Plan    1. Chronic HFrEF due to nonischemic cardiomyopathy -euvolemic and well compensated on exam.  Weight stable compared to last visit 3 months  ago.  NYHA I-2.  Continue GDMT including Entresto, spironolactone, Lasix, Coreg.  2. Nonobstructive coronary artery disease -reports no anginal symptoms.  No indication for ischemic evaluation at this time.  Continue GDMT including aspirin, Coreg, atorvastatin.  3. HLD, LDL goal less than 70 -labs via care everywhere showing lipid panel 12/27/19 LDL 75. 12/27/19 normal liver function.  Continue atorvastatin 20 mg daily.  As he is close to goal of LDL less than 70 will optimize dietary changes.  Lipid-lowering diet education provided.  4. Hiatal hernia/GERD - Continue to follow with GI.   Of note, he reported compliance with all of his medications but shared that he sometimes was not sure what medications were for what condition.  I wrote on his discharge paperwork what each of his various cardiac medications was used for.  Disposition: Follow up in 3 month(s) with Dr. Saunders Revel or APP.    Loel Dubonnet, NP 05/09/2020, 7:50 AM

## 2020-05-09 NOTE — Patient Instructions (Signed)
Medication Instructions:  No medication changes today.   *If you need a refill on your cardiac medications before your next appointment, please call your pharmacy*  Lab Work: No lab work today.   If you have labs (blood work) drawn today and your tests are completely normal, you will receive your results only by: Marland Kitchen MyChart Message (if you have MyChart) OR . A paper copy in the mail If you have any lab test that is abnormal or we need to change your treatment, we will call you to review the results.   Testing/Procedures: Your EKG today showed sinus bradycardia which is a slow but regular heart rhythm.   Follow-Up: At [facility], you and your health needs are our priority.  As part of our continuing mission to provide you with exceptional heart care, we have created designated Provider Care Teams.  These Care Teams include your primary Cardiologist (physician) and Advanced Practice Providers (APPs -  Physician Assistants and Nurse Practitioners) who all work together to provide you with the care you need, when you need it.  We recommend signing up for the patient portal called "MyChart".  Sign up information is provided on this After Visit Summary.  MyChart is used to connect with patients for Virtual Visits (Telemedicine).  Patients are able to view lab/test results, encounter notes, upcoming appointments, etc.  Non-urgent messages can be sent to your provider as well.   To learn more about what you can do with MyChart, go to [email or web address].    Your next appointment:   3 month(s)  The format for your next appointment:   In Person  Provider:   You may see Nelva Bush, MD or one of the following Advanced Practice Providers on your designated Care Team:    Murray Hodgkins, NP  Christell Faith, PA-C  Marrianne Mood, PA-C  Laurann Montana, NP  Other Instructions  Would recommend following up with GI regarding your ultrasound. Their phone number is [phone].    Fat and Cholesterol Restricted Eating Plan Getting too much fat and cholesterol in your diet may cause health problems. Choosing the right foods helps keep your fat and cholesterol at normal levels. This can keep you from getting certain diseases.   What are tips for following this plan? Meal planning  At meals, divide your plate into four equal parts: ? Fill one-half of your plate with vegetables and green salads. ? Fill one-fourth of your plate with whole grains. ? Fill one-fourth of your plate with low-fat (lean) protein foods.  Eat fish that is high in omega-3 fats at least two times a week. This includes mackerel, tuna, sardines, and salmon.  Eat foods that are high in fiber, such as whole grains, beans, apples, broccoli, carrots, peas, and barley. General tips   Work with your doctor to lose weight if you need to.  Avoid: ? Foods with added sugar. ? Fried foods. ? Foods with partially hydrogenated oils.  Limit alcohol intake to no more than 1 drink a day for nonpregnant women and 2 drinks a day for men. One drink equals 12 oz of beer, 5 oz of wine, or 1 oz of hard liquor. Reading food labels  Check food labels for: ? Trans fats. ? Partially hydrogenated oils. ? Saturated fat (g) in each serving. ? Cholesterol (mg) in each serving. ? Fiber (g) in each serving.  Choose foods with healthy fats, such as: ? Monounsaturated fats. ? Polyunsaturated fats. ? Omega-3 fats.  Choose grain products that have  whole grains. Look for the word "whole" as the first word in the ingredient list. Cooking  Cook foods using low-fat methods. These include baking, boiling, grilling, and broiling.  Eat more home-cooked foods. Eat at restaurants and buffets less often.  Avoid cooking using saturated fats, such as butter, cream, palm oil, palm kernel oil, and coconut oil. Recommended foods  Fruits  All fresh, canned (in natural juice), or frozen fruits. Vegetables  Fresh or frozen  vegetables (raw, steamed, roasted, or grilled). Green salads. Grains  Whole grains, such as whole wheat or whole grain breads, crackers, cereals, and pasta. Unsweetened oatmeal, bulgur, barley, quinoa, or brown rice. Corn or whole wheat flour tortillas. Meats and other protein foods  Ground beef (85% or leaner), grass-fed beef, or beef trimmed of fat. Skinless chicken or [place]. Ground chicken or [place]. Pork trimmed of fat. All fish and seafood. Egg whites. Dried beans, peas, or lentils. Unsalted nuts or seeds. Unsalted canned beans. Nut butters without added sugar or oil. Dairy  Low-fat or nonfat dairy products, such as skim or 1% milk, 2% or reduced-fat cheeses, low-fat and fat-free ricotta or cottage cheese, or plain low-fat and nonfat yogurt. Fats and oils  Tub margarine without trans fats. Light or reduced-fat mayonnaise and salad dressings. Avocado. Olive, canola, sesame, or safflower oils. The items listed above may not be a complete list of foods and beverages you can eat. Contact a dietitian for more information. Foods to avoid Fruits  Canned fruit in heavy syrup. Fruit in cream or butter sauce. Fried fruit. Vegetables  Vegetables cooked in cheese, cream, or butter sauce. Fried vegetables. Grains  White bread. White pasta. White rice. Cornbread. Bagels, pastries, and croissants. Crackers and snack foods that contain trans fat and hydrogenated oils. Meats and other protein foods  Fatty cuts of meat. Ribs, chicken wings, bacon, sausage, bologna, salami, chitterlings, fatback, hot dogs, bratwurst, and packaged lunch meats. Liver and organ meats. Whole eggs and egg yolks. Chicken and [place] with skin. Fried meat. Dairy  Whole or 2% milk, cream, half-and-half, and cream cheese. Whole milk cheeses. Whole-fat or sweetened yogurt. Full-fat cheeses. Nondairy creamers and whipped toppings. Processed cheese, cheese spreads, and cheese curds. Beverages  Alcohol. Sugar-sweetened drinks  such as sodas, lemonade, and fruit drinks. Fats and oils  Butter, stick margarine, lard, shortening, ghee, or bacon fat. Coconut, palm kernel, and palm oils. Sweets and desserts  Corn syrup, sugars, honey, and molasses. Candy. Jam and jelly. Syrup. Sweetened cereals. Cookies, pies, cakes, donuts, muffins, and ice cream. The items listed above may not be a complete list of foods and beverages you should avoid. Contact a dietitian for more information. Summary  Choosing the right foods helps keep your fat and cholesterol at normal levels. This can keep you from getting certain diseases.  At meals, fill one-half of your plate with vegetables and green salads.  Eat high-fiber foods, like whole grains, beans, apples, carrots, peas, and barley.  Limit added sugar, saturated fats, alcohol, and fried foods. This information is not intended to replace advice given to you by your health care provider. Make sure you discuss any questions you have with your health care provider. Document Revised: 08/12/2018 Document Reviewed: 08/26/2017 Elsevier Patient Education  [place].

## 2020-05-10 ENCOUNTER — Ambulatory Visit: Payer: Medicare HMO | Admitting: Internal Medicine

## 2020-05-11 ENCOUNTER — Ambulatory Visit: Payer: Medicare HMO | Admitting: Nurse Practitioner

## 2020-06-02 ENCOUNTER — Other Ambulatory Visit: Payer: Self-pay | Admitting: Family

## 2020-06-17 ENCOUNTER — Other Ambulatory Visit: Payer: Self-pay | Admitting: Family

## 2020-06-30 ENCOUNTER — Other Ambulatory Visit: Payer: Self-pay | Admitting: Family

## 2020-07-13 ENCOUNTER — Other Ambulatory Visit: Payer: Self-pay | Admitting: Internal Medicine

## 2020-07-13 ENCOUNTER — Other Ambulatory Visit (HOSPITAL_COMMUNITY): Payer: Self-pay | Admitting: Internal Medicine

## 2020-07-13 DIAGNOSIS — R1084 Generalized abdominal pain: Secondary | ICD-10-CM

## 2020-07-13 DIAGNOSIS — R634 Abnormal weight loss: Secondary | ICD-10-CM

## 2020-07-18 NOTE — Progress Notes (Signed)
Patient ID: Earl Hays, male    DOB: 10-20-1941, 79 y.o.   MRN: [id number]  HPI  Earl Hays is a 79 y/o male with a history of GERD, HTN, eczema, previous tobacco use and chronic heart failure.   Echo report from 01/28/20 reviewed and showed an EF of 45-50%. Echo report done 04/29/18 reviewed and showed an EF of 30-35%. Echo report done on 12/30/17 reviewed and showed an EF of 30-35% along with mild/mod Earl and moderately increased PA pressure of 45 mm Hg.   Cardiac catheterization done 12/31/17 showed mild-moderate non-obstructive CAD along with elevated left and right pressures.  Has not been admitted or been in the ED in the last 6 months   He presents today for a follow-up visit with a chief complaint of minimal shortness of breath upon moderate exertion. He describes this as having been present for several years although it occurs on an intermittent basis. He has not other symptoms and specifically denies any difficulty sleeping, dizziness, abdominal distention, palpitations, pedal edema, chest pain, cough, fatigue or weight gain.   Says that he golfed 18 holes yesterday and says that his shirt was soaked with sweat. Thinks that he drank enough water as he drank 5 "small bottles" of water while golfing and then ate fried fish for lunch afterwards.   Past Medical History:  Diagnosis Date  . Anemia   . CHF (congestive heart failure) ([place])   . Colon adenoma   . Coronary artery disease    a. 12/2017 Cath: LM min irregs, LAD 40ost, 46m, RI 60, LCX 81m, RCA large, nl.  . Eczema   . GERD (gastroesophageal reflux disease)   . HFrEF (heart failure with reduced ejection fraction) ([place])    a. 12/2017 Echo: EF 30-35%, gr2 DD. Mild to mod Earl. Mod dil LA. Nl RV fxn. Mildly dil RA. PASP 51mmHg; b. 04/2018 Echo: EF 30-35%, diff HK. Nl RV fxn.  . Hyperlipidemia   . Hypertension   . Mallory-Weiss tear    with upper gi bleed 12/2013  . NICM (nonischemic cardiomyopathy) ([place])    a. 12/2017 Echo: EF 30-35%, gr2  DD; b. 04/2018 Echo: EF 30-35%.  . Varices, esophageal ([place])    Past Surgical History:  Procedure Laterality Date  . CARDIAC CATHETERIZATION    . CATARACT EXTRACTION W/PHACO Left 01/21/2017   Procedure: CATARACT EXTRACTION PHACO AND INTRAOCULAR LENS PLACEMENT (IOC);  Surgeon: Birder Robson, MD;  Location: [facility] ORS;  Service: Ophthalmology;  Laterality: Left;  [place] 00:48 AP% 21.6 CDE 10.39 Fluid pack lot # [id number]  . CATARACT EXTRACTION W/PHACO Right 02/11/2017   Procedure: CATARACT EXTRACTION PHACO AND INTRAOCULAR LENS PLACEMENT (IOC);  Surgeon: Birder Robson, MD;  Location: [facility] ORS;  Service: Ophthalmology;  Laterality: Right;  [place] 00:42 AP% 22.4 CDE 9.47 Fluid pack lot # [id number]  . COLONOSCOPY    . COLONOSCOPY WITH PROPOFOL N/A 02/09/2020   Procedure: COLONOSCOPY WITH PROPOFOL;  Surgeon: Toledo, Benay Pike, MD;  Location: [facility] ENDOSCOPY;  Service: Gastroenterology;  Laterality: N/A;  . ESOPHAGOGASTRODUODENOSCOPY    . ESOPHAGOGASTRODUODENOSCOPY (EGD) WITH PROPOFOL N/A 02/09/2020   Procedure: ESOPHAGOGASTRODUODENOSCOPY (EGD) WITH PROPOFOL;  Surgeon: Toledo, Benay Pike, MD;  Location: [facility] ENDOSCOPY;  Service: Gastroenterology;  Laterality: N/A;  . HERNIA REPAIR    . JOINT REPLACEMENT     TKR  . PROSTATE SURGERY    . REPLACEMENT TOTAL KNEE BILATERAL Bilateral   . RIGHT/LEFT HEART CATH AND CORONARY ANGIOGRAPHY N/A 12/31/2017   Procedure: RIGHT/LEFT HEART CATH  AND CORONARY ANGIOGRAPHY;  Surgeon: Nelva Bush, MD;  Location: [place] [facility];  Service: Cardiovascular;  Laterality: N/A;   Family History  Problem Relation Age of Onset  . Brain cancer Mother    Social History   Tobacco Use  . Smoking status: Former Smoker    Packs/day: 0.25    Years: 44.00    Pack years: 11.00    Types: Cigarettes    Quit date: 2004    Years since quitting: 17.5  . Smokeless tobacco: Never Used  . Tobacco comment: 03/23/04  Substance Use Topics  . Alcohol use: Yes    Comment: occassional  beer   Allergies  Allergen Reactions  . Lamisil [Terbinafine] Itching and Rash   Prior to Admission medications   Medication Sig Start Date End Date Taking? Authorizing Provider  aspirin EC 81 MG tablet Take 81 mg by mouth daily.   Yes [provider]  atorvastatin (LIPITOR) 20 MG tablet Take 1 tablet (20 mg total) by mouth daily at 6 PM. 05/07/19 07/19/20 Yes Darylene Price A, FNP  carvedilol (COREG) 6.25 MG tablet TAKE 1 TABLET TWICE A DAY 06/02/20  Yes Morgann Woodburn A, FNP  COD LIVER OIL PO Take by mouth daily.   Yes [provider]  Cyanocobalamin 3000 MCG SUBL Place 3,000 mcg under the tongue once a week.    Yes [provider]  Docosahexaenoic Acid 100 MG CAPS Take by mouth daily.    Yes [provider]  Dupilumab, Asthma, (DUPIXENT [place]) Inject 300 mg into the skin every 14 (fourteen) days.   Yes [provider]  famotidine (PEPCID) 40 MG tablet Take 40 mg by mouth daily.   Yes [provider]  folic acid (FOLVITE) 1 MG tablet Take 1 mg by mouth daily.   Yes [provider]  furosemide (LASIX) 20 MG tablet TAKE 1 TABLET DAILY 06/17/20  Yes Cheral Cappucci A, FNP  halobetasol (ULTRAVATE) 0.05 % ointment Apply 1 application topically as needed. 02/10/20  Yes [provider]  montelukast (SINGULAIR) 10 MG tablet Take 10 mg by mouth at bedtime.   Yes [provider]  Omega-3 Fatty Acids (FISH OIL) 1000 MG CAPS Take by mouth daily.   Yes [provider]  pantoprazole (PROTONIX) 40 MG tablet Take 40 mg by mouth daily.    Yes [provider]  sacubitril-valsartan (ENTRESTO) 24-26 MG Take 1 tablet by mouth 2 (two) times daily.   Yes [provider]  spironolactone (ALDACTONE) 25 MG tablet TAKE 1 TABLET DAILY 06/30/20  Yes Alisa Graff, FNP    Review of Systems  Constitutional: Negative for appetite change and fatigue.  HENT: Negative for congestion, postnasal drip and sore throat.   Eyes:  Negative.   Respiratory: Positive for shortness of breath (with moderate exertion). Negative for cough and chest tightness.   Cardiovascular: Negative for chest pain, palpitations and leg swelling.  Gastrointestinal: Negative for abdominal distention and abdominal pain.  Endocrine: Negative.   Genitourinary: Negative.   Musculoskeletal: Negative for back pain and neck pain.  Skin: Negative.   Allergic/Immunologic: Negative.   Neurological: Negative for dizziness and light-headedness.  Hematological: Negative for adenopathy. Does not bruise/bleed easily.  Psychiatric/Behavioral: Negative for dysphoric mood and sleep disturbance. The patient is not nervous/anxious.    Vitals:   07/19/20 1517  BP: (!) 98/64  Pulse: 80  Resp: 18  SpO2: 99%  Weight: 180 lb (81.6 kg)  Height: 5\' 10"  (1.778 m)   Wt  Readings from Last 3 Encounters:  07/19/20 180 lb (81.6 kg)  05/09/20 184 lb 8 oz (83.7 kg)  02/10/20 175 lb (79.4 kg)   Lab Results  Component Value Date   CREATININE 1.29 (H) 11/05/2019   CREATININE 1.33 (H) 10/21/2019   CREATININE 1.08 02/12/2019    Physical Exam Vitals and nursing note reviewed.  Constitutional:      Appearance: He is well-developed.  HENT:     Head: Normocephalic and atraumatic.  Neck:     Vascular: No JVD.  Cardiovascular:     Rate and Rhythm: Normal rate and regular rhythm.  Pulmonary:     Effort: Pulmonary effort is normal.     Breath sounds: No wheezing or rales.  Abdominal:     General: There is no distension.     Tenderness: There is no abdominal tenderness.  Musculoskeletal:        General: No tenderness.     Cervical back: Normal range of motion and neck supple.  Skin:    General: Skin is warm and dry.  Neurological:     Mental Status: He is alert and oriented to person, place, and time.  Psychiatric:        Behavior: Behavior normal.        Thought Content: Thought content normal.    Assessment & Plan:  1: Chronic heart failure with  reduced ejection fraction- - NYHA class II - euvolemic today - weighing daily. Reminded to call for an overnight weight gain of >2 pounds or a weekly weight gain of >5 pounds - weight down 3 pounds from last visit 6 months ago - recent echo showed improvement in EF  - not adding salt to his food. Reminded to keep daily sodium intake to 2000mg  daily. Using Mrs. Dash & No salt; says that his appetite is "great" - golfing once / week and has also returned to the [facility] - saw cardiology Gilford Rile) 05/09/20 - reports receiving both his COVID vaccines - BP doesn't allow for titration of entresto  2: HTN- - BP on the low side; golfed 18 holes yesterday in the heat/ humidity; drank ~ 5 small bottles of water yesterday; instructed him to drink some extra water today and tomorrow as he may have gotten dehydrated yesterday - BMP from 06/30/20 reviewed and showed sodium 139, potassium 4.1, creatinine 1.4  and GFR 60 - saw PCP Sabra Heck) 07/12/20   Patient did not bring his medications nor a list. Each medication was verbally reviewed with the patient and he was encouraged to bring the bottles to every visit to confirm accuracy of list.  Return in 6 months or sooner for any questions/problems before then.

## 2020-07-19 ENCOUNTER — Other Ambulatory Visit: Payer: Self-pay

## 2020-07-19 ENCOUNTER — Encounter: Payer: Self-pay | Admitting: Family

## 2020-07-19 ENCOUNTER — Ambulatory Visit: Payer: Medicare HMO | Attending: Family | Admitting: Family

## 2020-07-19 VITALS — BP 98/64 | HR 80 | Resp 18 | Ht 70.0 in | Wt 180.0 lb

## 2020-07-19 DIAGNOSIS — L309 Dermatitis, unspecified: Secondary | ICD-10-CM | POA: Insufficient documentation

## 2020-07-19 DIAGNOSIS — I2510 Atherosclerotic heart disease of native coronary artery without angina pectoris: Secondary | ICD-10-CM | POA: Diagnosis not present

## 2020-07-19 DIAGNOSIS — Z8719 Personal history of other diseases of the digestive system: Secondary | ICD-10-CM | POA: Diagnosis not present

## 2020-07-19 DIAGNOSIS — Z96653 Presence of artificial knee joint, bilateral: Secondary | ICD-10-CM | POA: Diagnosis not present

## 2020-07-19 DIAGNOSIS — Z87891 Personal history of nicotine dependence: Secondary | ICD-10-CM | POA: Diagnosis not present

## 2020-07-19 DIAGNOSIS — E785 Hyperlipidemia, unspecified: Secondary | ICD-10-CM | POA: Insufficient documentation

## 2020-07-19 DIAGNOSIS — Z7982 Long term (current) use of aspirin: Secondary | ICD-10-CM | POA: Diagnosis not present

## 2020-07-19 DIAGNOSIS — I5022 Chronic systolic (congestive) heart failure: Secondary | ICD-10-CM | POA: Insufficient documentation

## 2020-07-19 DIAGNOSIS — Z79899 Other long term (current) drug therapy: Secondary | ICD-10-CM | POA: Diagnosis not present

## 2020-07-19 DIAGNOSIS — I110 Hypertensive heart disease with heart failure: Secondary | ICD-10-CM | POA: Insufficient documentation

## 2020-07-19 DIAGNOSIS — K219 Gastro-esophageal reflux disease without esophagitis: Secondary | ICD-10-CM | POA: Diagnosis not present

## 2020-07-19 DIAGNOSIS — I10 Essential (primary) hypertension: Secondary | ICD-10-CM

## 2020-07-19 DIAGNOSIS — Z883 Allergy status to other anti-infective agents status: Secondary | ICD-10-CM | POA: Diagnosis not present

## 2020-07-19 NOTE — Patient Instructions (Signed)
Continue weighing daily and call for an overnight weight gain of > 2 pounds or a weekly weight gain of >5 pounds. 

## 2020-07-21 ENCOUNTER — Other Ambulatory Visit: Payer: Self-pay

## 2020-07-21 ENCOUNTER — Ambulatory Visit
Admission: RE | Admit: 2020-07-21 | Discharge: 2020-07-21 | Disposition: A | Discharge status: Home / Self Care | Payer: Medicare HMO | Source: Ambulatory Visit | Attending: Internal Medicine | Admitting: Internal Medicine

## 2020-07-21 DIAGNOSIS — R1084 Generalized abdominal pain: Secondary | ICD-10-CM

## 2020-07-21 DIAGNOSIS — R634 Abnormal weight loss: Secondary | ICD-10-CM

## 2020-07-21 IMAGING — US US ABDOMEN COMPLETE
1 series · 13 of 25 positions shown · non-contrast
Comparison: None.

CLINICAL DATA: Generalized abdominal pain.  Weight loss.

EXAM:
ABDOMEN ULTRASOUND COMPLETE

[Series 1: us abdomen complete · 13 of 78 slices shown]
[im 1/78]
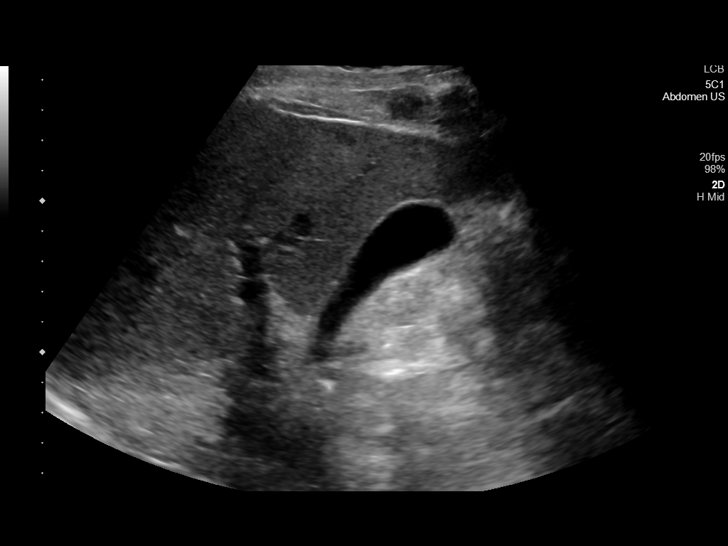
[im 7/78]
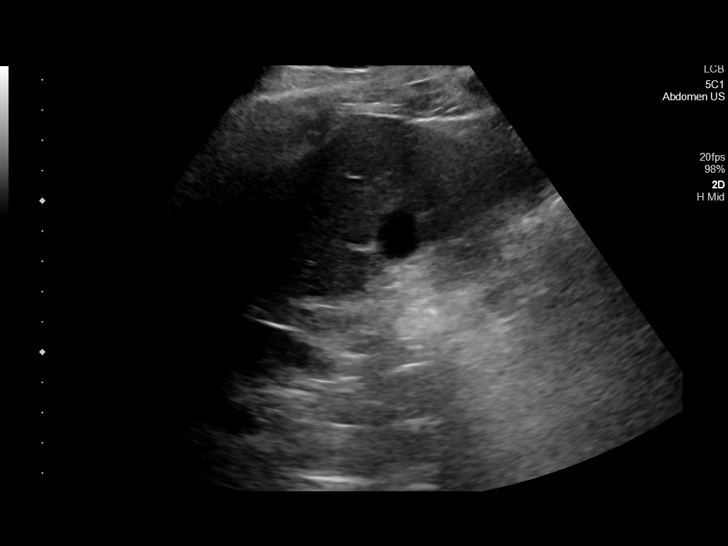
[im 13/78]
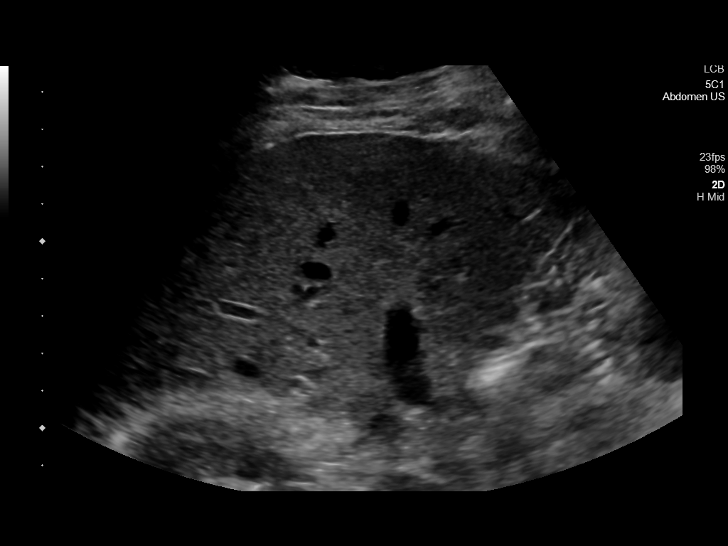
[im 20/78]
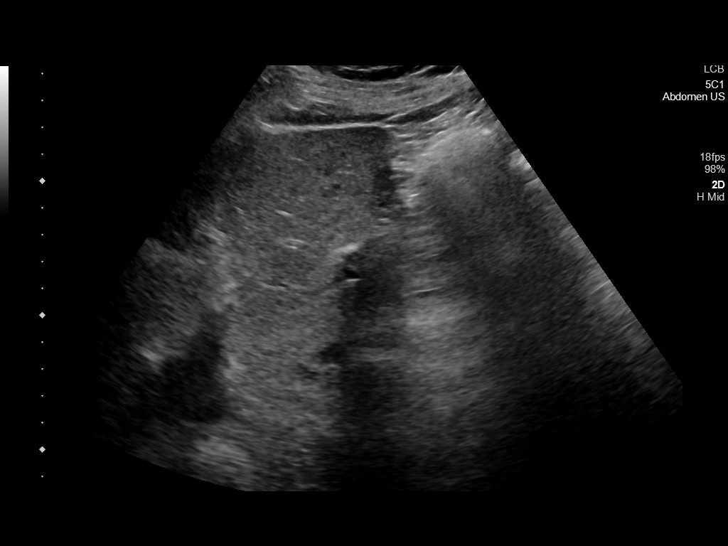
[im 26/78]
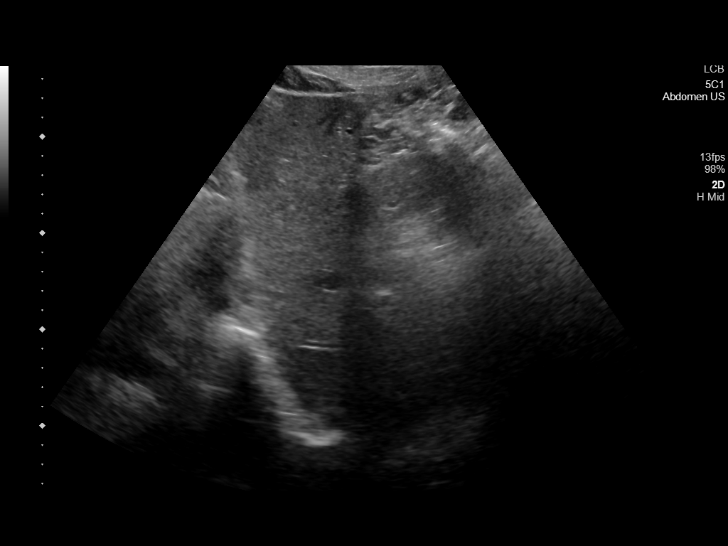
[im 33/78]
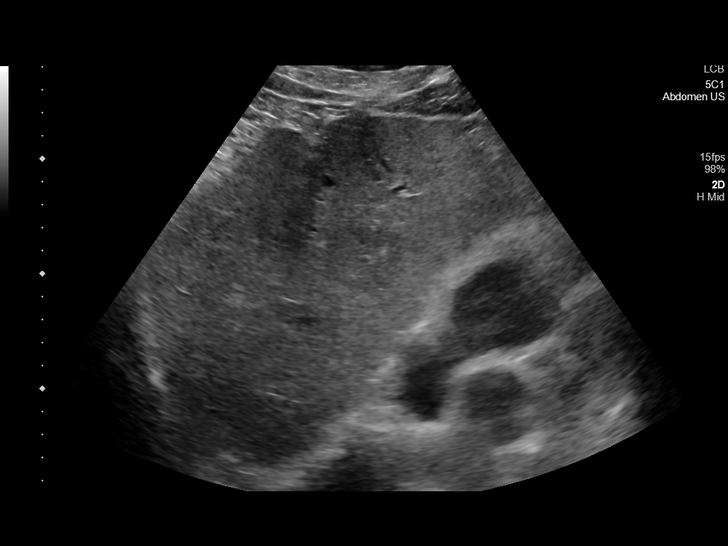
[im 39/78]
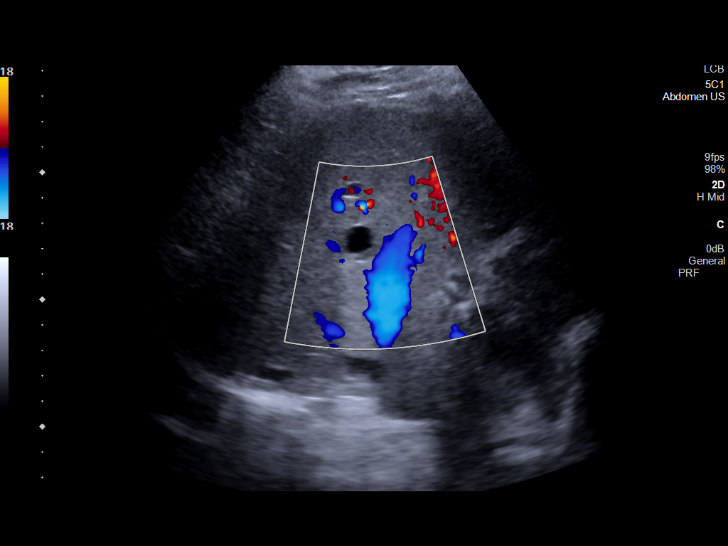
[im 45/78]
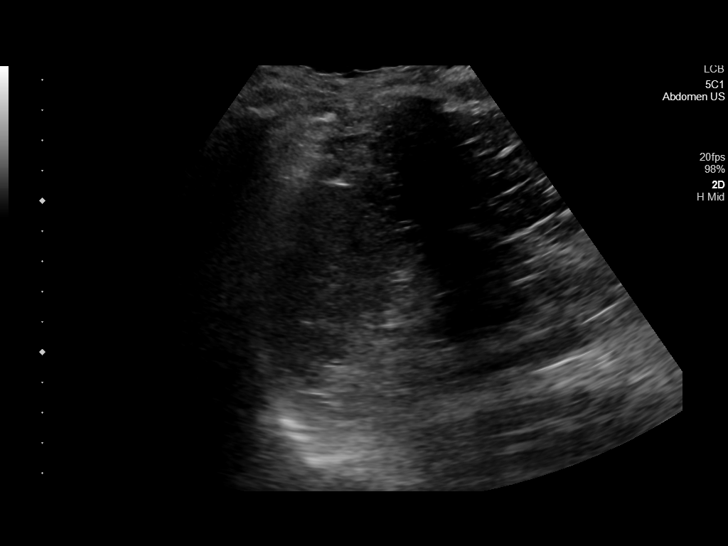
[im 52/78]
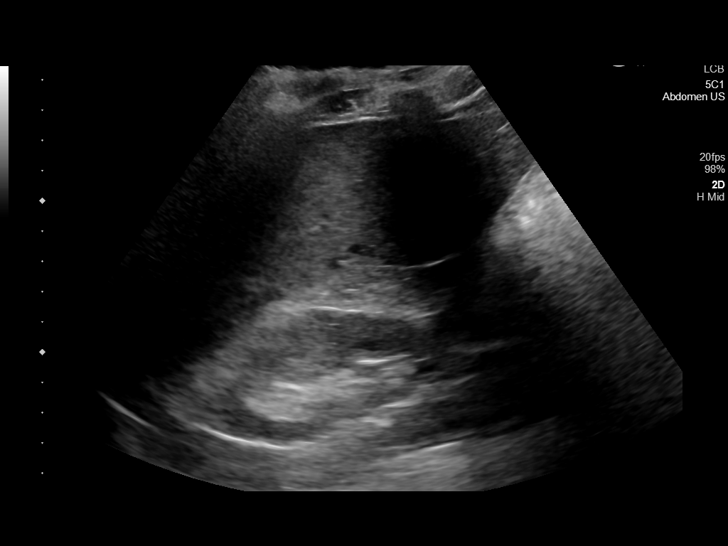
[im 58/78]
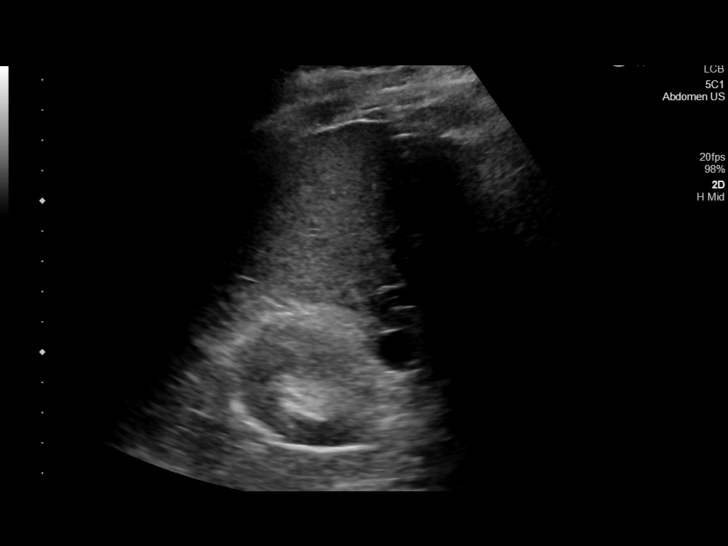
[im 65/78]
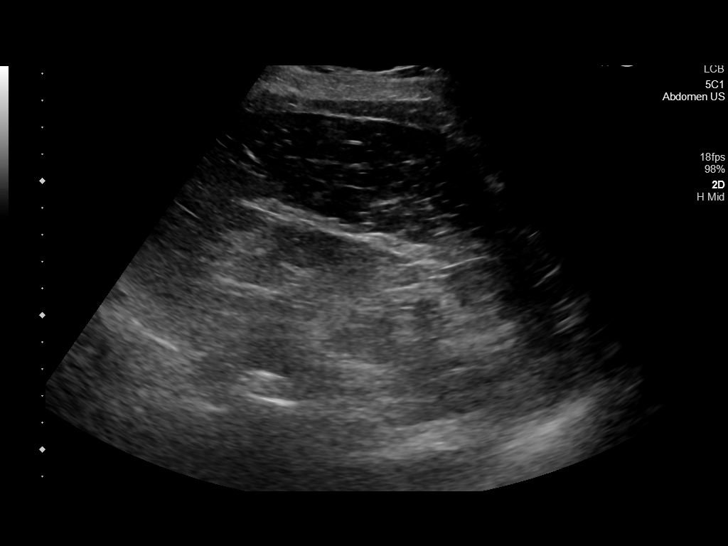
[im 71/78]
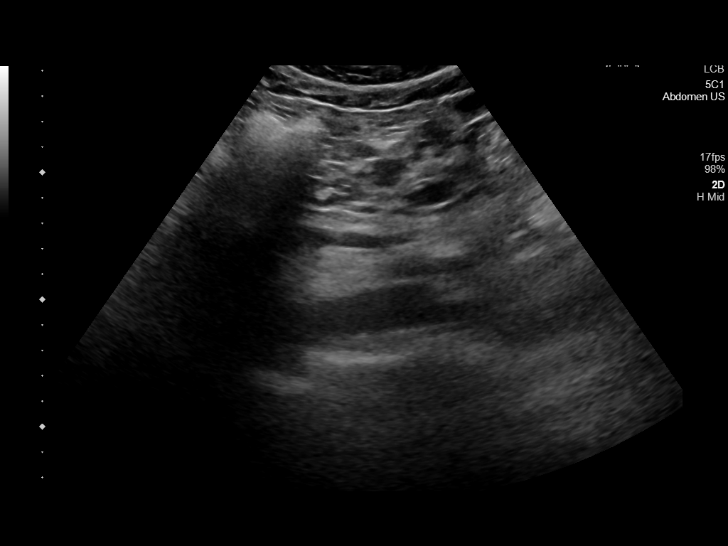
[im 78/78]
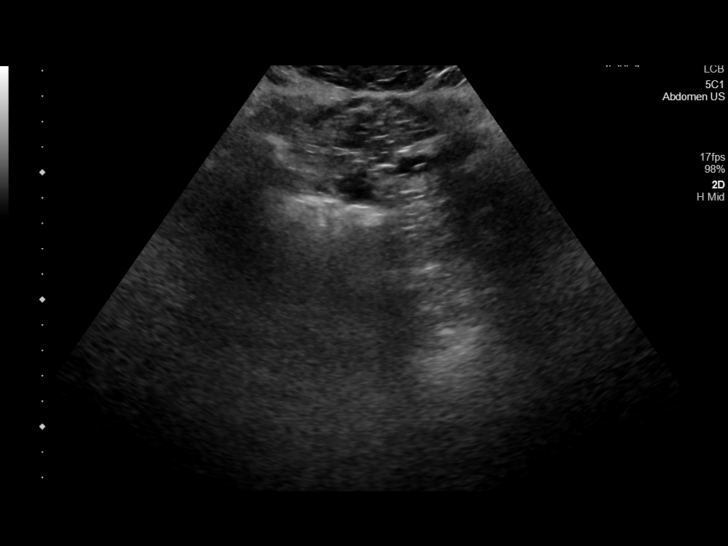

[13 of 25 positions shown; findings below may reference images not displayed]

FINDINGS: Gallbladder: No gallstones or wall thickening visualized. No
sonographic Murphy sign noted by sonographer.

Common bile duct: Diameter: 4 mm, normal.

Liver: Question minimally increased echogenicity which could suggest
mild steatosis. Few small liver cysts. No worrisome solid lesion.
The largest cyst previously seen in the medial segment of the left
lobe measuring 4 cm in diameter was not seen by the technologist. No
worrisome liver parenchymal finding. Portal vein is patent on color
Doppler imaging with normal direction of blood flow towards the
liver.

IVC: No abnormality visualized.

Pancreas: Poorly seen because of overlying bowel gas.

Spleen: Size and appearance within normal limits.

Right Kidney: Length: 7.8 cm. Echogenicity within normal limits. No
mass or hydronephrosis visualized.

Left Kidney: Length: 9.1 cm. Echogenicity within normal limits. No
mass or hydronephrosis visualized.

Abdominal aorta: No aneurysm visualized.

Other findings: No ascites
IMPRESSION: No acute or significant finding. Question slight increased
echogenicity of the liver parenchyma suggesting mild steatosis.
Several small liver cysts.

The pancreas is not visible because of overlying bowel gas.

## 2020-08-10 ENCOUNTER — Ambulatory Visit: Payer: Medicare HMO | Admitting: Internal Medicine

## 2020-08-10 ENCOUNTER — Other Ambulatory Visit: Payer: Self-pay

## 2020-08-10 ENCOUNTER — Encounter: Payer: Self-pay | Admitting: Internal Medicine

## 2020-08-10 VITALS — BP 100/60 | HR 69 | Ht 70.0 in | Wt 178.1 lb

## 2020-08-10 DIAGNOSIS — I2510 Atherosclerotic heart disease of native coronary artery without angina pectoris: Secondary | ICD-10-CM | POA: Diagnosis not present

## 2020-08-10 DIAGNOSIS — R6881 Early satiety: Secondary | ICD-10-CM

## 2020-08-10 DIAGNOSIS — R140 Abdominal distension (gaseous): Secondary | ICD-10-CM

## 2020-08-10 DIAGNOSIS — I5022 Chronic systolic (congestive) heart failure: Secondary | ICD-10-CM | POA: Diagnosis not present

## 2020-08-10 NOTE — Patient Instructions (Signed)
Medication Instructions:  Your physician has recommended you make the following change in your medication:  1- STOP Fish oil and [place].  If you continue to have bloating and early satiety in 2 weeks, please call your PCP or GI doctor for further evaluation and possible testing.  *If you need a refill on your cardiac medications before your next appointment, please call your pharmacy*  Follow-Up: At [facility], you and your health needs are our priority.  As part of our continuing mission to provide you with exceptional heart care, we have created designated Provider Care Teams.  These Care Teams include your primary Cardiologist (physician) and Advanced Practice Providers (APPs -  Physician Assistants and Nurse Practitioners) who all work together to provide you with the care you need, when you need it.  We recommend signing up for the patient portal called "MyChart".  Sign up information is provided on this After Visit Summary.  MyChart is used to connect with patients for Virtual Visits (Telemedicine).  Patients are able to view lab/test results, encounter notes, upcoming appointments, etc.  Non-urgent messages can be sent to your provider as well.   To learn more about what you can do with MyChart, go to [email or web address].    Your next appointment:   6 month(s)  The format for your next appointment:   In Person  Provider:    You may see Nelva Bush, MD or one of the following Advanced Practice Providers on your designated Care Team:    Murray Hodgkins, NP  Christell Faith, PA-C  Marrianne Mood, PA-C

## 2020-08-10 NOTE — Progress Notes (Signed)
Follow-up Outpatient Visit Date: 08/10/2020  Primary Care Provider: Rusty Aus, Earl Hays [place] [id number]  Chief Complaint: Swollen stomach  HPI:  Earl Hays is a 79 y.o. 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  He was last seen office in mid May by Laurann Montana, NP, at which time he reported stable exertional dyspnea but otherwise no symptoms.  No medication changes or further testing were pursued.  Today, Earl Hays is most concerned about abdominal distention and early satiety that has been present for several months.  He has been evaluated by GI and recently underwent upper GI series as well as abdominal ultrasound.  This was notable for a small hiatal hernia with acid reflux as well as possible mild hepatic steatosis.  Earl Hays has noticed modest improvement in his symptoms with escalation of pantoprazole.  However, he still feels full despite eating very little.  From a heart standpoint, Earl Hays is doing relatively well.  He has stable exertional dyspnea when walking long distances.  He exercises regularly at the [facility] without limitations.  He denies chest pain, palpitations, lightheadedness, and edema.  He has not had any medication side effects.  --------------------------------------------------------------------------------------------------  Past Medical History:  Diagnosis Date  . Anemia   . CHF (congestive heart failure) ([place])   . Colon adenoma   . Coronary artery disease    a. 12/2017 Cath: LM min irregs, LAD 40ost, 73m, RI 60, LCX 58m, RCA large, nl.  . Eczema   . GERD (gastroesophageal reflux disease)   . HFrEF (heart failure with  reduced ejection fraction) ([place])    a. 12/2017 Echo: EF 30-35%, gr2 DD. Mild to mod MR. Mod dil LA. Nl RV fxn. Mildly dil RA. PASP 65mmHg; b. 04/2018 Echo: EF 30-35%, diff HK. Nl RV fxn.  . Hyperlipidemia   . Hypertension   . Mallory-Weiss tear    with upper gi bleed 12/2013  . NICM (nonischemic cardiomyopathy) ([place])    a. 12/2017 Echo: EF 30-35%, gr2 DD; b. 04/2018 Echo: EF 30-35%.  . Varices, esophageal ([place])    Past Surgical History:  Procedure Laterality Date  . CARDIAC CATHETERIZATION    . CATARACT EXTRACTION W/PHACO Left 01/21/2017   Procedure: CATARACT EXTRACTION PHACO AND INTRAOCULAR LENS PLACEMENT (IOC);  Surgeon: Birder Robson, Earl Hays;  Location: [facility] ORS;  Service: Ophthalmology;  Laterality: Left;  [place] 00:48 AP% 21.6 CDE 10.39 Fluid pack lot # [id number]  . CATARACT EXTRACTION W/PHACO Right 02/11/2017   Procedure: CATARACT EXTRACTION PHACO AND INTRAOCULAR LENS PLACEMENT (IOC);  Surgeon: Birder Robson, Earl Hays;  Location: [facility] ORS;  Service: Ophthalmology;  Laterality: Right;  [place] 00:42 AP% 22.4 CDE 9.47 Fluid pack lot # [id number]  . COLONOSCOPY    . COLONOSCOPY WITH PROPOFOL N/A 02/09/2020   Procedure: COLONOSCOPY WITH PROPOFOL;  Surgeon: Toledo, Benay Pike, Earl Hays;  Location: [facility] ENDOSCOPY;  Service: Gastroenterology;  Laterality: N/A;  . ESOPHAGOGASTRODUODENOSCOPY    . ESOPHAGOGASTRODUODENOSCOPY (EGD) WITH PROPOFOL N/A 02/09/2020   Procedure: ESOPHAGOGASTRODUODENOSCOPY (EGD) WITH PROPOFOL;  Surgeon: Toledo, Benay Pike, Earl Hays;  Location: [facility] ENDOS[facility];  Service: Gastroenterology;  Laterality: N/A;  . HERNIA REPAIR    . JOINT REPLACEMENT     TKR  . PROSTATE SURGERY    . REPLACEMENT  TOTAL KNEE BILATERAL Bilateral   . RIGHT/LEFT HEART CATH AND CORONARY ANGIOGRAPHY N/A 12/31/2017   Procedure: RIGHT/LEFT HEART CATH AND CORONARY ANGIOGRAPHY;  Surgeon: Nelva Bush, Earl Hays;  Location: [place] [facility];  Service: Cardiovascular;  Laterality: N/A;    Current Meds  Medication Sig  . aspirin EC  81 MG tablet Take 81 mg by mouth daily.  Marland Kitchen atorvastatin (LIPITOR) 20 MG tablet Take 1 tablet (20 mg total) by mouth daily at 6 PM.  . carvedilol (COREG) 6.25 MG tablet TAKE 1 TABLET TWICE A DAY  . COD LIVER OIL PO Take by mouth daily.  . Cyanocobalamin 3000 MCG SUBL Place 3,000 mcg under the tongue once a week.   . Docosahexaenoic Acid 100 MG CAPS Take by mouth daily.   . Dupilumab, Asthma, (DUPIXENT [place]) Inject 300 mg into the skin every 14 (fourteen) days.  . famotidine (PEPCID) 40 MG tablet Take 40 mg by mouth daily.  . folic acid (FOLVITE) 1 MG tablet Take 1 mg by mouth daily.  . furosemide (LASIX) 20 MG tablet TAKE 1 TABLET DAILY  . halobetasol (ULTRAVATE) 0.05 % ointment Apply 1 application topically as needed.  . montelukast (SINGULAIR) 10 MG tablet Take 10 mg by mouth at bedtime.  . Omega-3 Fatty Acids (FISH OIL) 1000 MG CAPS Take by mouth daily.  . pantoprazole (PROTONIX) 40 MG tablet Take 40 mg by mouth 2 (two) times daily.   . sacubitril-valsartan (ENTRESTO) 24-26 MG Take 1 tablet by mouth 2 (two) times daily.  Marland Kitchen spironolactone (ALDACTONE) 25 MG tablet TAKE 1 TABLET DAILY    Allergies: Lamisil [terbinafine]  Social History   Tobacco Use  . Smoking status: Former Smoker    Packs/day: 0.25    Years: 44.00    Pack years: 11.00    Types: Cigarettes    Quit date: 2004    Years since quitting: 17.6  . Smokeless tobacco: Never Used  . Tobacco comment: 03/23/04  Vaping Use  . Vaping Use: Never used  Substance Use Topics  . Alcohol use: Yes    Comment: occassional beer  . Drug use: No    Family History  Problem Relation Age of Onset  . Brain cancer Mother     Review of Systems: A 12-system review of systems was performed and was negative except as noted in the HPI.  --------------------------------------------------------------------------------------------------  Physical Exam: BP 100/60 (BP Location: Left Arm, Patient Position: Sitting, Cuff Size: Normal)    Pulse 69   Ht 5\' 10"  (1.778 m)   Wt 178 lb 2 oz (80.8 kg)   SpO2 97%   BMI 25.56 kg/m   General: NAD. Neck: No JVD or HJR. Lungs: Clear to auscultation without wheezes or crackles. Heart: Regular rate and rhythm without murmurs, rubs, or gallops. Abdomen: Soft, nontender, nondistended. Extremities: No lower extremity edema.  EKG: Normal sinus rhythm with PACs.  Otherwise, no significant abnormality.  Lab Results  Component Value Date   WBC 7.4 12/31/2017   HGB 13.9 12/31/2017   HCT 41.3 12/31/2017   MCV 91.8 12/31/2017   PLT 136 (L) 12/31/2017    Lab Results  Component Value Date   NA 141 11/05/2019   K 4.0 11/05/2019   CL 101 11/05/2019   CO2 27 11/05/2019   BUN 16 11/05/2019   CREATININE 1.29 (H) 11/05/2019   GLUCOSE 92 11/05/2019   ALT 23 10/07/2018    Lab Results  Component Value Date   CHOL 153 10/07/2018   HDL 68 10/07/2018  [place] 50 10/07/2018   TRIG 176 (H) 10/07/2018   CHOLHDL 2.3 10/07/2018    --------------------------------------------------------------------------------------------------  ASSESSMENT AND PLAN: Chronic HFrEF: Earl Hays appears euvolemic on exam with stable NYHA class II symptoms.  We will continue his current regimen of carvedilol, Entresto, and spironolactone, as well as low-dose furosemide.  Low normal blood pressure precludes escalation today.  Labs last month through Dr. Ammie Ferrier office show stable renal function and potassium.  Nonobstructive coronary artery disease: No symptoms to suggest worsening coronary insufficiency.  Continue aspirin and atorvastatin.  Recent lipid panel showed LDL of 85, which is just above goal.  In light of ongoing abdominal issues, we will defer escalation of atorvastatin.  Abdominal distention and early satiety: Examination today is unremarkable.  I do not believe his symptoms are driven by heart failure, as Earl Hays otherwise appears euvolemic without symptoms of decompensated heart failure.   Recent abdominal ultrasound was notable for mild hepatic steatosis.  Upper GI series also made note of a small hiatal hernia as well as acid reflux.  I encouraged Earl Hays to discontinue cod liver oil and his fish oil to see if this helps his symptoms.  If not, he should reach out to his PCP and gastroenterologist to discuss further work-up, including possible cross-sectional imaging of the abdomen.  Follow-up: Return to clinic in 6 months.  Nelva Bush, Earl Hays 08/10/2020 8:57 AM

## 2020-08-17 ENCOUNTER — Other Ambulatory Visit: Payer: Self-pay | Admitting: Family

## 2020-11-24 ENCOUNTER — Other Ambulatory Visit: Payer: Self-pay | Admitting: Family

## 2020-11-24 MED ORDER — ENTRESTO 24-26 MG PO TABS: 1.0000 | ORAL_TABLET | Freq: Two times a day (BID) | ORAL | 5 refills | Status: DC

## 2020-12-04 ENCOUNTER — Other Ambulatory Visit: Payer: Self-pay | Admitting: Family

## 2020-12-04 MED ORDER — ENTRESTO 24-26 MG PO TABS: 1.0000 | ORAL_TABLET | Freq: Two times a day (BID) | ORAL | 3 refills | Status: DC

## 2021-01-10 DIAGNOSIS — R21 Rash and other nonspecific skin eruption: Principal | ICD-10-CM

## 2021-01-10 DIAGNOSIS — L309 Dermatitis, unspecified: Principal | ICD-10-CM

## 2021-01-10 MED ORDER — DUPILUMAB 300 MG/2 ML SUBCUTANEOUS SYRINGE
1 refills | 0.00000 days
Start: 2021-01-10 — End: ?

## 2021-01-10 NOTE — Unmapped (Signed)
Patient called for another refill of Dupixent.  LOV 11/2019.  Needs ov scheduled for f/u.

## 2021-01-10 NOTE — Unmapped (Signed)
Patient declined scheduling    Thanks!

## 2021-01-11 MED ORDER — DUPILUMAB 300 MG/2 ML SUBCUTANEOUS SYRINGE
1 refills | 0.00000 days
Start: 2021-01-11 — End: ?

## 2021-01-15 MED ORDER — DUPILUMAB 300 MG/2 ML SUBCUTANEOUS SYRINGE
SUBCUTANEOUS | 1 refills | 0.00000 days | Status: CP
Start: 2021-01-15 — End: ?

## 2021-01-15 NOTE — Progress Notes (Signed)
Patient ID: Earl Hays, male    DOB: 03-15-1941, 80 y.o.   MRN: [id number]  HPI  Earl Hays is a 80 y/o male with a history of GERD, HTN, eczema, previous tobacco use and chronic heart failure.   Echo report from 01/28/20 reviewed and showed an EF of 45-50%. Echo report done 04/29/18 reviewed and showed an EF of 30-35%. Echo report done on 12/30/17 reviewed and showed an EF of 30-35% along with mild/mod Earl and moderately increased PA pressure of 45 mm Hg.   Cardiac catheterization done 12/31/17 showed mild-moderate non-obstructive CAD along with elevated left and right pressures.  Has not been admitted or been in the ED in the last 6 months   He presents today for a follow-up visit with a chief complaint of minimal shortness of breath upon moderate exertion. He describes this as chronic in nature having been present for several years but overall he feels "really good". He continues to have issues with his chronic eczema and has a f/u appointment with dermatology next month. He denies any difficulty sleeping, dizziness, abdominal distention, palpitations, pedal edema, chest pain, cough, fatigue or weight gain.   Past Medical History:  Diagnosis Date  . Anemia   . CHF (congestive heart failure) ([place])   . Colon adenoma   . Coronary artery disease    a. 12/2017 Cath: LM min irregs, LAD 40ost, 81m, RI 60, LCX 98m, RCA large, nl.  . Eczema   . GERD (gastroesophageal reflux disease)   . HFrEF (heart failure with reduced ejection fraction) ([place])    a. 12/2017 Echo: EF 30-35%, gr2 DD. Mild to mod Earl. Mod dil LA. Nl RV fxn. Mildly dil RA. PASP 61mmHg; b. 04/2018 Echo: EF 30-35%, diff HK. Nl RV fxn.  . Hyperlipidemia   . Hypertension   . Mallory-Weiss tear    with upper gi bleed 12/2013  . NICM (nonischemic cardiomyopathy) ([place])    a. 12/2017 Echo: EF 30-35%, gr2 DD; b. 04/2018 Echo: EF 30-35%.  . Varices, esophageal ([place])    Past Surgical History:  Procedure Laterality Date  . CARDIAC CATHETERIZATION     . CATARACT EXTRACTION W/PHACO Left 01/21/2017   Procedure: CATARACT EXTRACTION PHACO AND INTRAOCULAR LENS PLACEMENT (IOC);  Surgeon: Birder Robson, MD;  Location: [facility] OR[facility];  Service: Ophthalmology;  Laterality: Left;  [place] 00:48 AP% 21.6 CDE 10.39 Fluid pack lot # [id number]  . CATARACT EXTRACTION W/PHACO Right 02/11/2017   Procedure: CATARACT EXTRACTION PHACO AND INTRAOCULAR LENS PLACEMENT (IOC);  Surgeon: Birder Robson, MD;  Location: [facility] OR[facility];  Service: Ophthalmology;  Laterality: Right;  [place] 00:42 AP% 22.4 CDE 9.47 Fluid pack lot # [id number]  . COLONOSCOPY    . COLONOSCOPY WITH PROPOFOL N/A 02/09/2020   Procedure: COLONOSCOPY WITH PROPOFOL;  Surgeon: Toledo, Benay Pike, MD;  Location: [facility];  Service: Gastroenterology;  Laterality: N/A;  . ESOPHAGOGASTRODUODENOSCOPY    . ESOPHAGOGASTRODUODENOSCOPY (EGD) WITH PROPOFOL N/A 02/09/2020   Procedure: ESOPHAGOGASTRODUODENOSCOPY (EGD) WITH PROPOFOL;  Surgeon: Toledo, Benay Pike, MD;  Location: [facility];  Service: Gastroenterology;  Laterality: N/A;  . HERNIA REPAIR    . JOINT REPLACEMENT     TKR  . PROSTATE SURGERY    . REPLACEMENT TOTAL KNEE BILATERAL Bilateral   . RIGHT/LEFT HEART CATH AND CORONARY ANGIOGRAPHY N/A 12/31/2017   Procedure: RIGHT/LEFT HEART CATH AND CORONARY ANGIOGRAPHY;  Surgeon: Nelva Bush, MD;  Location: [place] [facility] LAB;  Service: Cardiovascular;  Laterality: N/A;   Family History  Problem Relation Age  of Onset  . Brain cancer Mother    Social History   Tobacco Use  . Smoking status: Former Smoker    Packs/day: 0.25    Years: 44.00    Pack years: 11.00    Types: Cigarettes    Quit date: 2004    Years since quitting: 18.0  . Smokeless tobacco: Never Used  . Tobacco comment: 03/23/04  Substance Use Topics  . Alcohol use: Yes    Comment: occassional beer   Allergies  Allergen Reactions  . Lamisil [Terbinafine] Itching and Rash   Prior to Admission medications   Medication Sig Start Date  End Date Taking? Authorizing Provider  aspirin EC 81 MG tablet Take 81 mg by mouth daily.   Yes [provider]  atorvastatin (LIPITOR) 20 MG tablet TAKE 1 TABLET DAILY AT 6PM 08/17/20  Yes Darylene Price A, FNP  carvedilol (COREG) 6.25 MG tablet TAKE 1 TABLET TWICE A DAY 06/02/20  Yes Selah Klang A, FNP  Cyanocobalamin 3000 MCG SUBL Place 3,000 mcg under the tongue once a week.    Yes [provider]  Dupilumab, Asthma, (DUPIXENT [place]) Inject 300 mg into the skin every 14 (fourteen) days.   Yes [provider]  famotidine (PEPCID) 40 MG tablet Take 40 mg by mouth daily.   Yes [provider]  folic acid (FOLVITE) 1 MG tablet Take 1 mg by mouth daily.   Yes [provider]  furosemide (LASIX) 20 MG tablet TAKE 1 TABLET DAILY 06/17/20  Yes Darylene Price A, FNP  pantoprazole (PROTONIX) 40 MG tablet Take 40 mg by mouth 2 (two) times daily.    Yes [provider]  sacubitril-valsartan (ENTRESTO) 24-26 MG Take 1 tablet by mouth 2 (two) times daily. 12/04/20  Yes Darylene Price A, FNP  spironolactone (ALDACTONE) 25 MG tablet TAKE 1 TABLET DAILY 06/30/20  Yes Darylene Price A, FNP  tadalafil (CIALIS) 10 MG tablet Take 10 mg by mouth daily as needed for erectile dysfunction.   Yes [provider]    Review of Systems  Constitutional: Negative for appetite change and fatigue.  HENT: Negative for congestion, postnasal drip and sore throat.   Eyes: Negative.   Respiratory: Positive for shortness of breath (with moderate exertion). Negative for cough and chest tightness.   Cardiovascular: Negative for chest pain, palpitations and leg swelling.  Gastrointestinal: Negative for abdominal distention and abdominal pain.  Endocrine: Negative.   Genitourinary: Negative.   Musculoskeletal: Negative for back pain and neck pain.  Skin: Positive for rash (eczema).  Allergic/Immunologic: Negative.   Neurological: Negative for dizziness and light-headedness.   Hematological: Negative for adenopathy. Does not bruise/bleed easily.  Psychiatric/Behavioral: Negative for dysphoric mood and sleep disturbance. The patient is not nervous/anxious.    Vitals:   01/16/21 0949  BP: 103/64  Pulse: 83  Resp: 18  SpO2: 100%  Weight: 180 lb 2 oz (81.7 kg)  Height: 5\' 10"  (1.778 m)   Wt Readings from Last 3 Encounters:  01/16/21 180 lb 2 oz (81.7 kg)  08/10/20 178 lb 2 oz (80.8 kg)  07/19/20 180 lb (81.6 kg)   Lab Results  Component Value Date   CREATININE 1.29 (H) 11/05/2019   CREATININE 1.33 (H) 10/21/2019   CREATININE 1.08 02/12/2019    Physical Exam Vitals and nursing note reviewed.  Constitutional:      Appearance: He is well-developed.  HENT:     Head: Normocephalic and atraumatic.  Neck:     Vascular: No JVD.  Cardiovascular:     Rate and Rhythm: Normal rate and regular rhythm.  Pulmonary:     Effort: Pulmonary effort is normal.     Breath sounds: No wheezing or rales.  Abdominal:     General: There is no distension.     Tenderness: There is no abdominal tenderness.  Musculoskeletal:        General: No tenderness.     Cervical back: Normal range of motion and neck supple.  Skin:    General: Skin is warm and dry.  Neurological:     Mental Status: He is alert and oriented to person, place, and time.  Psychiatric:        Behavior: Behavior normal.        Thought Content: Thought content normal.    Assessment & Plan:  1: Chronic heart failure with reduced ejection fraction- - NYHA class II - euvolemic today - weighing daily. Reminded to call for an overnight weight gain of >2 pounds or a weekly weight gain of >5 pounds - weight unchanged from last visit 6 months ago - not adding salt to his food. Reminded to keep daily sodium intake to 2000mg  daily. Using Mrs. Dash & No salt; says that his appetite is "great" - golfing when the weather allows - saw cardiology (End) 08/10/20 - reports receiving all 3 COVID vaccines -  reports receiving flu vaccine for this season - BP doesn't allow for titration of entresto  2: HTN- - BP on the low side today - BMP from 01/05/21 reviewed and showed sodium 146, potassium 3.8, creatinine 1.4  and GFR 60 - saw PCP Sabra Heck) 01/10/21   Medication bottles were reviewed.   Due to HF stability, will not make a return appointment for patient at this time. Advised patient that he could call back at anytime to schedule another appointment and he was comfortable with this plan.   He's currently getting his entresto through [employer] patient assistance. Explained that Dr. Darnelle Bos office could take over filling out the yearly paperwork or he would need an appointment in our office and he verbalized understanding.

## 2021-01-16 ENCOUNTER — Encounter: Payer: Self-pay | Admitting: Family

## 2021-01-16 ENCOUNTER — Other Ambulatory Visit: Payer: Self-pay

## 2021-01-16 ENCOUNTER — Ambulatory Visit: Payer: Medicare HMO | Attending: Family | Admitting: Family

## 2021-01-16 VITALS — BP 103/64 | HR 83 | Resp 18 | Ht 70.0 in | Wt 180.1 lb

## 2021-01-16 DIAGNOSIS — I2510 Atherosclerotic heart disease of native coronary artery without angina pectoris: Secondary | ICD-10-CM | POA: Diagnosis not present

## 2021-01-16 DIAGNOSIS — Z79899 Other long term (current) drug therapy: Secondary | ICD-10-CM | POA: Insufficient documentation

## 2021-01-16 DIAGNOSIS — Z883 Allergy status to other anti-infective agents status: Secondary | ICD-10-CM | POA: Insufficient documentation

## 2021-01-16 DIAGNOSIS — L309 Dermatitis, unspecified: Secondary | ICD-10-CM | POA: Diagnosis not present

## 2021-01-16 DIAGNOSIS — K219 Gastro-esophageal reflux disease without esophagitis: Secondary | ICD-10-CM | POA: Insufficient documentation

## 2021-01-16 DIAGNOSIS — Z7982 Long term (current) use of aspirin: Secondary | ICD-10-CM | POA: Insufficient documentation

## 2021-01-16 DIAGNOSIS — I5022 Chronic systolic (congestive) heart failure: Secondary | ICD-10-CM | POA: Insufficient documentation

## 2021-01-16 DIAGNOSIS — Z87891 Personal history of nicotine dependence: Secondary | ICD-10-CM | POA: Diagnosis not present

## 2021-01-16 DIAGNOSIS — I110 Hypertensive heart disease with heart failure: Secondary | ICD-10-CM | POA: Insufficient documentation

## 2021-01-16 DIAGNOSIS — I10 Essential (primary) hypertension: Secondary | ICD-10-CM

## 2021-01-16 NOTE — Patient Instructions (Addendum)
Continue weighing daily and call for an overnight weight gain of > 2 pounds or a weekly weight gain of >5 pounds.   Call us in the future if you'd like to make another appointment 

## 2021-01-24 ENCOUNTER — Other Ambulatory Visit: Payer: Self-pay

## 2021-01-24 ENCOUNTER — Encounter: Payer: Self-pay | Admitting: Internal Medicine

## 2021-01-24 ENCOUNTER — Ambulatory Visit: Payer: Medicare HMO | Admitting: Internal Medicine

## 2021-01-24 VITALS — BP 111/68 | HR 71 | Ht 70.0 in | Wt 180.0 lb

## 2021-01-24 DIAGNOSIS — N529 Male erectile dysfunction, unspecified: Secondary | ICD-10-CM

## 2021-01-24 DIAGNOSIS — I2510 Atherosclerotic heart disease of native coronary artery without angina pectoris: Secondary | ICD-10-CM

## 2021-01-24 DIAGNOSIS — I5022 Chronic systolic (congestive) heart failure: Secondary | ICD-10-CM

## 2021-01-24 DIAGNOSIS — I428 Other cardiomyopathies: Secondary | ICD-10-CM

## 2021-01-24 NOTE — Progress Notes (Signed)
Follow-up Outpatient Visit Date: 01/24/2021  Primary Care Provider: Rusty Aus, MD [place] [address]  Chief Complaint: Follow-up heart failure  HPI:  Earl Hays is a 80 y.o. male with history of chronic HFrEF due to nonischemic cardiomyopathy (LVEF improved to 45-50% on most recent echo in 01/2020), nonobstructive CAD, esophageal varices with upper GI bleed, recurrent epistaxis, and eczema, who presents for follow-up of cardiomyopathy.  I last saw him in August at which time he complained of abdominal distention and early satiety for several months.  Symptoms improved with escalation of pantoprazole.  He was doing well from a heart standpoint and exercising regularly without limitations.  Today, Earl Hays main concern is erectile dysfunction.  He recently brought this up to his PCP who suggested that his cardiac medications may be contributing to this.  He was given a prescription for tadalafil, which Earl Hays has yet to try.  He also continues to have some pruritus and dryness of his skin, followed closely through [facility] dermatology.  From a heart standpoint, Earl Hays is doing well without chest pain, shortness of breath, palpitations, lightheadedness, or edema.  --------------------------------------------------------------------------------------------------  Past Medical History:  Diagnosis Date  . Anemia   . CHF (congestive heart failure) ([place])   . Colon adenoma   . Coronary artery disease    a. 12/2017 Cath: LM min irregs, LAD 40ost, 57m, RI 60, LCX 18m, RCA large, nl.  . Eczema   . GERD (gastroesophageal reflux disease)   . HFrEF (heart failure with reduced ejection fraction) ([place])    a. 12/2017 Echo: EF 30-35%, gr2 DD. Mild to mod MR. Mod dil LA. Nl RV fxn. Mildly dil RA. PASP 26mmHg; b. 04/2018 Echo: EF 30-35%, diff HK. Nl RV fxn.  . Hyperlipidemia   . Hypertension   . Mallory-Weiss tear    with upper gi bleed 12/2013   . NICM (nonischemic cardiomyopathy) ([place])    a. 12/2017 Echo: EF 30-35%, gr2 DD; b. 04/2018 Echo: EF 30-35%.  . Varices, esophageal ([place])    Past Surgical History:  Procedure Laterality Date  . CARDIAC CATHETERIZATION    . CATARACT EXTRACTION W/PHACO Left 01/21/2017   Procedure: CATARACT EXTRACTION PHACO AND INTRAOCULAR LENS PLACEMENT (IOC);  Surgeon: Birder Robson, MD;  Location: [facility] ORS;  Service: Ophthalmology;  Laterality: Left;  [place] 00:48 AP% 21.6 CDE 10.39 Fluid pack lot # [id number]  . CATARACT EXTRACTION W/PHACO Right 02/11/2017   Procedure: CATARACT EXTRACTION PHACO AND INTRAOCULAR LENS PLACEMENT (IOC);  Surgeon: Birder Robson, MD;  Location: [facility] ORS;  Service: Ophthalmology;  Laterality: Right;  [place] 00:42 AP% 22.4 CDE 9.47 Fluid pack lot # [id number]  . COLONOSCOPY    . COLONOSCOPY WITH PROPOFOL N/A 02/09/2020   Procedure: COLONOSCOPY WITH PROPOFOL;  Surgeon: Toledo, Benay Pike, MD;  Location: [facility] ENDOSCOPY;  Service: Gastroenterology;  Laterality: N/A;  . ESOPHAGOGASTRODUODENOSCOPY    . ESOPHAGOGASTRODUODENOSCOPY (EGD) WITH PROPOFOL N/A 02/09/2020   Procedure: ESOPHAGOGASTRODUODENOSCOPY (EGD) WITH PROPOFOL;  Surgeon: Toledo, Benay Pike, MD;  Location: [facility] ENDOSCOPY;  Service: Gastroenterology;  Laterality: N/A;  . HERNIA REPAIR    . JOINT REPLACEMENT     TKR  . PROSTATE SURGERY    . REPLACEMENT TOTAL KNEE BILATERAL Bilateral   . RIGHT/LEFT HEART CATH AND CORONARY ANGIOGRAPHY N/A 12/31/2017   Procedure: RIGHT/LEFT HEART CATH AND CORONARY ANGIOGRAPHY;  Surgeon: Nelva Bush, MD;  Location: [facility];  Service: Cardiovascular;  Laterality: N/A;    Current  Meds  Medication Sig  . aspirin EC 81 MG tablet Take 81 mg by mouth daily.  Marland Kitchen atorvastatin (LIPITOR) 20 MG tablet TAKE 1 TABLET DAILY AT 6PM  . carvedilol (COREG) 6.25 MG tablet TAKE 1 TABLET TWICE A DAY  . Cyanocobalamin 3000 MCG SUBL Place 3,000 mcg under the tongue once a week.   . Dupilumab, Asthma,  (DUPIXENT [place]) Inject 300 mg into the skin every 14 (fourteen) days.  . famotidine (PEPCID) 40 MG tablet Take 40 mg by mouth daily.  . folic acid (FOLVITE) 1 MG tablet Take 1 mg by mouth daily.  . furosemide (LASIX) 20 MG tablet TAKE 1 TABLET DAILY  . pantoprazole (PROTONIX) 40 MG tablet Take 40 mg by mouth 2 (two) times daily.   . sacubitril-valsartan (ENTRESTO) 24-26 MG Take 1 tablet by mouth 2 (two) times daily.  Marland Kitchen spironolactone (ALDACTONE) 25 MG tablet TAKE 1 TABLET DAILY  . tadalafil (CIALIS) 10 MG tablet Take 10 mg by mouth daily as needed for erectile dysfunction.    Allergies: Lamisil [terbinafine]  Social History   Tobacco Use  . Smoking status: Former Smoker    Packs/day: 0.25    Years: 44.00    Pack years: 11.00    Types: Cigarettes    Quit date: 2004    Years since quitting: 18.1  . Smokeless tobacco: Never Used  . Tobacco comment: 03/23/04  Vaping Use  . Vaping Use: Never used  Substance Use Topics  . Alcohol use: Yes    Comment: occassional beer  . Drug use: No    Family History  Problem Relation Age of Onset  . Brain cancer Mother     Review of Systems: A 12-system review of systems was performed and was negative except as noted in the HPI.  --------------------------------------------------------------------------------------------------  Physical Exam: BP 111/68 (BP Location: Left Arm, Patient Position: Sitting, Cuff Size: Normal)   Pulse 71   Ht 5\' 10"  (1.778 m)   Wt 180 lb (81.6 kg)   BMI 25.83 kg/m   General:  NAD. Neck: No JVD or HJR. Lungs: Clear to auscultation bilaterally without wheezes or crackles. Heart: Regular rate and rhythm without murmurs, rubs, or gallops. Abdomen: Soft, nontender, nondistended. Extremities: No lower extremity edema.  EKG: Normal sinus rhythm with PACs and PVCs.  Otherwise, no significant abnormality.  Lab Results  Component Value Date   WBC 7.4 12/31/2017   HGB 13.9 12/31/2017   HCT 41.3 12/31/2017    MCV 91.8 12/31/2017   PLT 136 (L) 12/31/2017    Lab Results  Component Value Date   NA 141 11/05/2019   K 4.0 11/05/2019   CL 101 11/05/2019   CO2 27 11/05/2019   BUN 16 11/05/2019   CREATININE 1.29 (H) 11/05/2019   GLUCOSE 92 11/05/2019   ALT 23 10/07/2018    Lab Results  Component Value Date   CHOL 153 10/07/2018   HDL 68 10/07/2018   LDLCALC 50 10/07/2018   TRIG 176 (H) 10/07/2018   CHOLHDL 2.3 10/07/2018    --------------------------------------------------------------------------------------------------  ASSESSMENT AND PLAN: Chronic HFrEF with recovered ejection fraction due to nonischemic cardiomyopathy: Earl Hays appears euvolemic with NYHA class I symptoms.  He is tolerating his current goal-directed medical therapy well.  We will defer medication changes at this time.  Complete metabolic panel through Dr. Ammie Ferrier office on 01/05/2021 showed stable potassium (3.8) and creatinine (1.4).  Nonobstructive coronary artery disease: No symptoms to suggest worsening coronary insufficiency.  Continue medical therapy with aspirin and  atorvastatin to prevent progression of disease.  Most recent LDL in 09/2020 was at goal.  Erectile dysfunction: Likely multifactorial including underlying cardiovascular disease and potential medication side effects.  We discussed the risks and benefits of decreasing/stopping some of his goal-directed medical therapy for his cardiomyopathy and have agreed to defer this in order to prevent recurrent heart failure.  I think it would be reasonable for him to try tadalafil as prescribed by Dr. Sabra Heck.  Follow-up: Return to clinic in 6 months.  Nelva Bush, MD 01/24/2021 8:48 AM

## 2021-01-24 NOTE — Patient Instructions (Signed)

## 2021-01-24 NOTE — Unmapped (Signed)
Received fax stating PA was needed for Dupixent. PA initiated via CMM.    Saleh Debski (Key: [id number])     DUPIXENT (dupilumab) 300MG /2ML pre-filled syringes

## 2021-01-25 NOTE — Unmapped (Signed)
PA for Dupixent was approved.    Patient notified via MyChart message.

## 2021-01-30 ENCOUNTER — Encounter: Admit: 2021-01-30 | Discharge: 2021-01-31 | Payer: BLUE CROSS/BLUE SHIELD

## 2021-01-30 DIAGNOSIS — L2084 Intrinsic (allergic) eczema: Principal | ICD-10-CM

## 2021-01-30 MED ORDER — HALOBETASOL PROPIONATE 0.05 % TOPICAL OINTMENT
6 refills | 0.00000 days | Status: CN
Start: 2021-01-30 — End: ?

## 2021-01-30 MED ORDER — CETIRIZINE 10 MG TABLET
ORAL_TABLET | Freq: Every day | ORAL | 4 refills | 90.00000 days | Status: CP
Start: 2021-01-30 — End: 2022-01-30

## 2021-01-30 MED ORDER — TRIAMCINOLONE ACETONIDE 0.1 % TOPICAL OINTMENT
INTRAMUSCULAR | 0 refills | 0.00000 days | Status: CP
Start: 2021-01-30 — End: 2021-01-30

## 2021-01-30 MED ORDER — DUPIXENT 300 MG/2 ML SUBCUTANEOUS PEN INJECTOR
SUBCUTANEOUS | 4 refills | 0.00000 days | Status: CP
Start: 2021-01-30 — End: 2021-02-08

## 2021-01-30 NOTE — Unmapped (Addendum)
Click Here to Visit [facility] Covid-19 Vaccine Hub  Get the latest facts on the COVID-19 vaccines.    Or visit [facility]???s COVID-19 Vaccine Hub at [email or web address] to review the latest facts about the vaccines.      Your resident doctor today was Dr. Harrison Mons. We are glad we had the chance to participate in your care!      Basic Skin Care for Sensitive Skin    Bathing: Bathe in warm (not hot!) water. Apply a thick moisturizer right after bathing while your skin is still damp -- the thicker the better!    Soaps/Washes:     ??? Cerave Hydrating Cleanser  ??? Free & Clear Liquid Cleanser  ??? Neutrogena Ultra Gentle Hydrating Cleanser  ??? Dove Sensitive Skin [employer] (make sure it's the sensitive version!)  ??? Vanicream moisturizing bar    Moisturizers:  ??? Petroleum jelly or Vaseline ointment (make sure it says Fragrance Free)  ??? Vanicream Moisturizing Cream  ??? Vaniply Ointment  ??? CeraVe Moisturizing Cream  ??? Neutrogena [place] Formula Hand Cream    Shampoos/Conditioners:  ??? Free & Clear Shampoo and Conditioner  ??? AFM Safe Choice Shampoo    Laundry: All clothing, towels, and linens should be washed in a fragrance-free detergent. These usually come in a white bottle.     Fragrance-Free Detergents:  ??? All Free & Clear detergent  ??? Tide Free & Gentle detergent  ??? Arm & Hammer Sensitive Skin Free & Clear detergent    Dryer Sheets & Fabric Softeners: DO NOT use these! They put fragrance in your clothes.   ??? Wool balls for the dryer are okay    Beauty products: Check the inactive ingredients list for all products to make sure they don't contain fragrance!  ??? Do NOT use perfumes, body sprays, or colognes!   ??? Do NOT use baby wipes or other kinds of wipes for cleaning the face or removing makeup!    Deodorants:  ??? Crystal Mineral Deodorant Roll-On or Crystal Mineral Deodorant Stone ([employer] or [employer].com) -- apply generously to clean skin; allow a few minutes to dry  ??? Vanicream Deodorant (amazon.com and some drugstores)  ??? Almay Sensitive Skin Anti-Perspirant and Deodorant (Amazon.com)    Household items: Avoid using any products that put fragrance into the air of your home.  Do NOT use:  ??? Candles  ??? Plug-in air fresheners  ??? Bathroom sprays  ??? Essential oil diffusers    Sun protection:     Protective clothing is best! The more skin you cover, the better.  ??? Wide-brimmed hats  ??? Sunglasses  ??? Long sleeve shirts or rash-guard shirts   ??? Pants or swim tights  ??? Wet suits  ??? Some clothes have built in SPF called UPF. UPF 50+ clothes can be bought Teacher, English as a foreign language at [email or web address], [email or web address] and [email or web address]. You can also find them at [facility] and similar outdoor lifestyle stores.    Avoid going outside when the sun is at its strongest!   ??? Stay inside between 10:00am and 4:00pm if possible.    Exposed skin should be protected with broad spectrum sunscreen that is SPF 30 or higher     ??? Sunscreen should be reapplied every 2 hours. If you are swimming or sweating, you should towel-dry and reapply every 1 hour!    ??? For children (6 months+) or for people with sensitive skin, use titanium dioxide or zinc oxide-based sunscreens.  o Banana Boat Simply Protect Kids  o Vanicream  Sunscreen  o Cerave Baby Sunscreen Lotion  o Aveeno Baby Continuous Protection Sensitive Skin Zinc Oxide Sunscreen

## 2021-01-30 NOTE — Unmapped (Signed)
Dermatology Note     Assessment and Plan:      Atopic dermatitis, chronic - currently well controlled on dupixent and topicals  - previously quite severe and refractory to therapy (as below)  - continue dupixent 300mg  q14d. Counseled again on risks / side effects and fortunately he has tolerated this well.  Refilled topicals and discussed appropriate use and potential side effects of topical steroids  - The patient was advised to use a gentle cleanser such as Dove or Cetaphil and to avoid anti-bacterial soaps which may be too irritating. We recommended the frequent use of a greasy emollient such as Cetaphil, Cerave or Vaseline to moisturize the skin once to twice daily. We also recommended the avoidance of scratching and irritants to the skin.    - dupilumab (DUPIXENT PEN) 300 mg/2 mL PnIj; Inject 300mg  (one pen) under the skin every 14 days  Dispense: 8 mL; Refill: 4 -- will try to switch to pen per patient preference as pain with injection has been his primary challenge  - triamcinolone (KENALOG) 0.1 % ointment; Apply topically for itch and rash 2-3 times daily with cake frosting like application.  Dispense: 454 g; Refill: 6   -- sent 36mo supply to home pharmacy in [place], then will get through mail order thereafter  - cetirizine (ZYRTEC) 10 MG tablet; Take 1 tablet (10 mg total) by mouth daily.  Dispense: 90 tablet; Refill: 4      The patient was advised to call for an appointment should any new, changing, or symptomatic lesions develop.     RTC: Return in about 1 year (around 01/30/2022). or sooner as needed   _________________________________________________________________      Chief Complaint     Chief Complaint   Patient presents with   ??? Eczema     needs medication renewed, back breaks out and legs, legs and arms burning after shower       HPI     Donald Sanchez is a 80 y.o. male who presents as a returning patient (last seen 12/08/2019) to [facility] for follow up of atopic derm.     At [facility]:  - halobetasol (ULTRAVATE) 0.05 % ointment; Apply topically twice daily for stubborn areas of eczema, as needed only.  Dispense: 50 g; Refill: 6  - triamcinolone (KENALOG) 0.1 % ointment; Apply topically for itch and rash 2-3 times daily with cake frosting like application.  Dispense: 454 g; Refill: 6  - cetirizine (ZYRTEC) 10 MG tablet; Take 1 tablet (10 mg total) by mouth daily.  Dispense: 30 tablet; Refill: 3    Today, he reports that overall things been going fairly well.  He does occasionally get some spots flareup on his back and on his lower legs, but nowhere near as severe as prior.  He has been tolerating Dupixent well and thinks it has been helping. He does have some pain with injections and had some bleeding once or twice that made him nervous but did stop quickly.  He has currently run out of dupilumab as well as all of his topicals and is interested in refills.  He has questions about how long he will have to take Dupixent.    The patient denies any other new or changing lesions or areas of concern.     Pertinent Past Medical History     Problem List        Musculoskeletal and Integument    Intrinsic atopic dermatitis - Primary     Prior treatments  include clobetasol, triamcinolone, betamethasone, methotrexate, nbUVB  We have also treated him empirically with permethrin and ivermectin. Currently improved on dupilumab.  The medication that historically has been most helpful for him is prednisone although he has a strong contraindication to this given his cardiac status with a history of acute on chronic congestive heart failure.    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    We had talked him previously about doing extended patch testing but he has had True testing prior to seeing [place].    His last lab work with [place] was in 2019.  His differential with [place] at that time did not show an elevation in eosinophils.          Relevant Medications dupilumab (DUPIXENT PEN) 300 mg/2 mL PnIj    triamcinolone (KENALOG) 0.1 % ointment    cetirizine (ZYRTEC) 10 MG tablet          Past Medical History, Family History, Social History, Medication List, Allergies, and Problem List were reviewed in the rooming section of Epic.     ROS: Other than symptoms mentioned in the HPI, no fevers, chills, or other skin complaints    Physical Examination     GENERAL: Well-appearing male in no acute distress, resting comfortably.  NEURO: Alert and oriented, answers questions appropriately  PSYCH: Normal mood and affect  EYES: lids clear, conjunctiva pink, no scleral icterus or other color change  SKIN (Full Skin Exam): Examination of the face, eyelids, lips, nose, ears, neck, chest, abdomen, back, arms, legs, hands, feet, palms, soles, nails was performed  A few thin scaly hyperpigmented plaques on the lower legs  Scattered dark brown macules and patches in area of prior rash on periaxillary upper back.  Axillary vaults without rash or pigment change.    All areas not commented on are within normal limits or unremarkable  - feet exam limited by socks/shoes per patient preference      (Approved Template 09/04/2020)

## 2021-02-07 NOTE — Unmapped (Signed)
Message from patient stating that his prescriptions from his visit on 01/30/21 were sent to the incorrect pharmacy.  He needs them sent to [facility] (info listed in the chart).

## 2021-02-08 DIAGNOSIS — L2084 Intrinsic (allergic) eczema: Principal | ICD-10-CM

## 2021-02-08 MED ORDER — DUPIXENT 300 MG/2 ML SUBCUTANEOUS PEN INJECTOR
SUBCUTANEOUS | 4 refills | 0.00000 days | Status: CP
Start: 2021-02-08 — End: ?

## 2021-02-12 DIAGNOSIS — L2084 Intrinsic (allergic) eczema: Principal | ICD-10-CM

## 2021-02-12 MED ORDER — DUPIXENT 300 MG/2 ML SUBCUTANEOUS PEN INJECTOR
4 refills | 0.00000 days
Start: 2021-02-12 — End: ?

## 2021-02-12 NOTE — Unmapped (Signed)
Prescription refill request for Dupixent, Last office visit was 01/30/2021    Please Advise

## 2021-02-13 NOTE — Unmapped (Signed)
Call from patient stating his Dupixent was sent to the wrong pharmacy.  Needs to be sent to pharmacy with phone # [phone].  Returned call to patient to notify him Rx was sent to [facility] pharmacy which has this phone number on 02/08/21.  To call this number to set up delivery of medication.

## 2021-03-26 ENCOUNTER — Telehealth: Payer: Self-pay

## 2021-03-26 MED ORDER — ENTRESTO 24-26 MG PO TABS: 1.0000 | ORAL_TABLET | Freq: Two times a day (BID) | ORAL | 3 refills | Status: DC

## 2021-03-26 NOTE — Telephone Encounter (Signed)
*  STAT* If patient is at the pharmacy, call can be transferred to refill team.   1. Which medications need to be refilled? (please list name of each medication and dose if known) ENTRESTO  2. Which pharmacy/location (including street and city if local pharmacy) is medication to be sent to? [facility]  3. Do they need a 30 day or 90 day supply? [place]

## 2021-04-02 ENCOUNTER — Telehealth: Payer: Self-pay | Admitting: Family

## 2021-04-02 NOTE — Telephone Encounter (Signed)
Spoke with patient after he has called me several times needing help with his novartis application for entresto that as expired and needs to be redone. Since we no longer see patient he was advised to call his cardiologist but then patient called back stating they did not have an application for him. I called patient back to clarify that he will have to go into Dr. Marisue Humble office to fill out a current novartis application as the one they have has expired before they can send it off and patient stated he understood and will come by sometime this week to fill out.   Margarita Croke, NT

## 2021-04-02 NOTE — Telephone Encounter (Signed)
Attempt to reach pt via phone, LM to call back regarding PA for [facility].  Do not see an application on file for this 2022 year. Looked in Paramedic in med room and [employer], RN boxed and application was not seen. Pt will need to print out or come to clinic to pick up an application to fill out his portion of PA or he can refer to HF clinic for PA as noted in notes from 01/16/2021 via [facility].

## 2021-04-02 NOTE — Telephone Encounter (Signed)
Please resend per patient to novartis patient assistance

## 2021-04-03 ENCOUNTER — Telehealth: Payer: Self-pay | Admitting: Family

## 2021-04-03 NOTE — Telephone Encounter (Signed)
Spoke to patient and told him we will handle the application for entresto and for him to come by and sign a new application. Was told by patient he would come tomorrow morning (8/86/77) to sign application.   Doral Ventrella, NT

## 2021-04-05 ENCOUNTER — Telehealth: Payer: Self-pay | Admitting: Internal Medicine

## 2021-04-05 NOTE — Telephone Encounter (Signed)
An application for Entresto patient assistance was faxed to our office yesterday from the CHF clinic. The patient is no longer following with Darylene Price, NP in the CHF clinic, they had the patient complete his portion of the application and forwarded to Dr. Saunders Revel to complete the physician portion.  Dr. Saunders Revel has signed the form.  A completed assistance application for entresto has been faxed to [employer] at [phone]. Confirmation received.   Completed paperwork was placed in the file cabinet for assistance.

## 2021-04-10 NOTE — Telephone Encounter (Signed)
Incoming call from [employer].  Patient is approved for patient assistance through the end of the calendar year.

## 2021-04-17 ENCOUNTER — Other Ambulatory Visit: Payer: Self-pay | Admitting: Family

## 2021-04-24 ENCOUNTER — Telehealth: Payer: Self-pay | Admitting: Family

## 2021-04-24 NOTE — Telephone Encounter (Signed)
Spoke to patient who stated he was out of 3 medications that were denied refills but also told me that he was no longer in the care of Otila Kluver and didn't have to be seen by [place] anymore. Patient was advised to call Dr. Saunders Revel for his medication request instead.   Shiasia Porro, NT

## 2021-05-02 ENCOUNTER — Telehealth: Payer: Self-pay | Admitting: Internal Medicine

## 2021-05-02 DIAGNOSIS — L2084 Intrinsic (allergic) eczema: Principal | ICD-10-CM

## 2021-05-02 DIAGNOSIS — I5022 Chronic systolic (congestive) heart failure: Secondary | ICD-10-CM

## 2021-05-02 MED ORDER — DUPIXENT 300 MG/2 ML SUBCUTANEOUS PEN INJECTOR
SUBCUTANEOUS | 4 refills | 0.00000 days | Status: CP
Start: 2021-05-02 — End: ?

## 2021-05-02 NOTE — Telephone Encounter (Signed)
*  STAT* If patient is at the pharmacy, call can be transferred to refill team.   1. Which medications need to be refilled? (please list name of each medication and dose if known)  Carvedilol 6.25 MG 1 tablet 2 times daily Furosemide 20 MG 1 tablet daily Spironolactone 25 MG 1 tablet daily   2. Which pharmacy/location (including street and city if local pharmacy) is medication to be sent to? [facility]   3. Do they need a 30 day or 90 day supply? 90 day   Patient wants to clarify that he still needs to take these medications.  If so, please refill.

## 2021-05-02 NOTE — Telephone Encounter (Signed)
Dr. Saunders Revel do you want to refill these medications? Your last OV said to defer. Patient is no longer seeing [facility].

## 2021-05-02 NOTE — Unmapped (Signed)
Fax received for refill request for dupixent 90 day supply. [place] 01/30/2021.

## 2021-05-03 MED ORDER — FUROSEMIDE 20 MG PO TABS: 20.0000 mg | ORAL_TABLET | Freq: Every day | ORAL | 3 refills | Status: DC

## 2021-05-03 MED ORDER — CARVEDILOL 6.25 MG PO TABS: 6.2500 mg | ORAL_TABLET | Freq: Two times a day (BID) | ORAL | 3 refills | Status: DC

## 2021-05-03 MED ORDER — SPIRONOLACTONE 25 MG PO TABS: 1.0000 | ORAL_TABLET | Freq: Every day | ORAL | 3 refills | Status: DC

## 2021-05-03 NOTE — Telephone Encounter (Signed)
Called and spoke with patient and informed him of Dr. Darnelle Bos recommendation as stated below. Patient will get a lab draw most likely on Monday 05/07/21.   Patient was grateful for the call and the medication refill.

## 2021-05-03 NOTE — Telephone Encounter (Signed)
Yes, it is fine to refill these medications.  Please have Mr. Bunt come in for a basic metabolic panel at his convenience to ensure stable renal function and electrolytes.  Thanks.  Nelva Bush, MD [facility]

## 2021-05-04 NOTE — Unmapped (Signed)
Message from [facility] requesting a new prescription for an 84 day supply of Dupixent per the patient's request.

## 2021-05-07 ENCOUNTER — Other Ambulatory Visit
Admission: RE | Admit: 2021-05-07 | Discharge: 2021-05-07 | Disposition: A | Discharge status: Home / Self Care | Payer: Medicare HMO | Attending: Internal Medicine | Admitting: Internal Medicine

## 2021-05-07 DIAGNOSIS — L2084 Intrinsic (allergic) eczema: Principal | ICD-10-CM

## 2021-05-07 DIAGNOSIS — I5022 Chronic systolic (congestive) heart failure: Secondary | ICD-10-CM | POA: Diagnosis present

## 2021-05-07 LAB — BASIC METABOLIC PANEL
Anion gap: 6 (ref 5–15)
BUN: 18 mg/dL (ref 8–23)
CO2: 29 mmol/L (ref 22–32)
Calcium: 8.8 mg/dL — ABNORMAL LOW (ref 8.9–10.3)
Chloride: 104 mmol/L (ref 98–111)
Creatinine, Ser: 1.27 mg/dL — ABNORMAL HIGH (ref 0.61–1.24)
GFR, Estimated: 58 mL/min — ABNORMAL LOW (ref >60)
Glucose, Bld: 104 mg/dL — ABNORMAL HIGH (ref 70–99)
Potassium: 3.9 mmol/L (ref 3.5–5.1)
Sodium: 139 mmol/L (ref 135–145)

## 2021-05-07 MED ORDER — DUPIXENT 300 MG/2 ML SUBCUTANEOUS PEN INJECTOR
SUBCUTANEOUS | 3 refills | 0.00000 days | Status: CP
Start: 2021-05-07 — End: ?

## 2021-05-07 NOTE — Unmapped (Signed)
05/07/21 - Refill sent. [facility]

## 2021-07-25 ENCOUNTER — Encounter: Payer: Self-pay | Admitting: Internal Medicine

## 2021-07-25 ENCOUNTER — Other Ambulatory Visit: Payer: Self-pay

## 2021-07-25 ENCOUNTER — Ambulatory Visit: Payer: Medicare HMO | Admitting: Internal Medicine

## 2021-07-25 VITALS — BP 110/60 | HR 67 | Ht 70.0 in | Wt 176.0 lb

## 2021-07-25 DIAGNOSIS — I2510 Atherosclerotic heart disease of native coronary artery without angina pectoris: Secondary | ICD-10-CM | POA: Diagnosis not present

## 2021-07-25 DIAGNOSIS — I428 Other cardiomyopathies: Secondary | ICD-10-CM | POA: Diagnosis not present

## 2021-07-25 DIAGNOSIS — E782 Mixed hyperlipidemia: Secondary | ICD-10-CM

## 2021-07-25 DIAGNOSIS — I5022 Chronic systolic (congestive) heart failure: Secondary | ICD-10-CM

## 2021-07-25 MED ORDER — ATORVASTATIN CALCIUM 40 MG PO TABS
40.0000 mg | ORAL_TABLET | Freq: Every day | ORAL | 3 refills | Status: DC
Start: 2021-07-25 — End: 2021-07-25

## 2021-07-25 MED ORDER — ATORVASTATIN CALCIUM 40 MG PO TABS: 40.0000 mg | ORAL_TABLET | Freq: Every day | ORAL | 3 refills | Status: DC

## 2021-07-25 NOTE — Patient Instructions (Signed)
Medication Instructions:   Your physician has recommended you make the following change in your medication:   INCREASE Atorvastatin 40 mg daily   *If you need a refill on your cardiac medications before your next appointment, please call your pharmacy*   Lab Work:  -  Your physician recommends that you return for FASTING lab work in: 3 MONTHS (Lipid panel / CMET)  -  Please go to the [facility]. You will check in at the front desk to the right as you walk into the atrium. Valet Parking is offered if needed.  - No appointment needed. You may go any day between 7 am and 6 pm.    Testing/Procedures:  None ordered   Follow-Up: At [facility], you and your health needs are our priority.  As part of our continuing mission to provide you with exceptional heart care, we have created designated Provider Care Teams.  These Care Teams include your primary Cardiologist (physician) and Advanced Practice Providers (APPs -  Physician Assistants and Nurse Practitioners) who all work together to provide you with the care you need, when you need it.  We recommend signing up for the patient portal called "MyChart".  Sign up information is provided on this After Visit Summary.  MyChart is used to connect with patients for Virtual Visits (Telemedicine).  Patients are able to view lab/test results, encounter notes, upcoming appointments, etc.  Non-urgent messages can be sent to your provider as well.   To learn more about what you can do with MyChart, go to [email or web address].    Your next appointment:   6 month(s)  The format for your next appointment:   In Person  Provider:   You may see Nelva Bush, MD or one of the following Advanced Practice Providers on your designated Care Team:   Murray Hodgkins, NP Christell Faith, PA-C Marrianne Mood, PA-C Cadence Dewey, [place]

## 2021-07-25 NOTE — Progress Notes (Signed)
Follow-up Outpatient Visit Date: 07/25/2021  Primary Care Provider: Rusty Aus, MD [place] [id number]  Chief Complaint: Follow-up heart failure  HPI:  Mr. Edelman is a 80 y.o. male with history of chronic HFrEF due to nonischemic cardiomyopathy (LVEF improved to 45-50% on most recent echo in 01/2020), nonobstructive CAD, esophageal varices with upper GI bleed, recurrent epistaxis, and eczema, who presents for follow-up of cardiomyopathy and nonobstructive CAD.  I last saw him in early February, at which time he was most concerned about erectile dysfunction.  From a heart failure standpoint, he was asymptomatic.  We agreed to continue his current medications and trial tadalafil through his PCP for his ED.  He was recently placed on ciprofloxacin by his PCP out of concern for prostatitis.  Mr. Burlingame reports that his urinary hesitancy has improved but has not completely resolved (he is still on Cipro).  From a heart standpoint, Mr. Eckstrom has been feeling fairly well, denying chest pain, shortness of breath, palpitations, edema, and orthopnea.  He has felt a little bit lightheaded at work, where the air conditioner was recently out of service.  He has been trying to take breaks and stay well-hydrated.  He was prescribed tadalafil by Dr. Sabra Heck but has not tried this yet.  --------------------------------------------------------------------------------------------------  Past Medical History:  Diagnosis Date   Anemia    CHF (congestive heart failure) ([place])    Colon adenoma    Coronary artery disease    a. 12/2017 Cath: LM min irregs, LAD 40ost, 63m RI 60, LCX 21mRCA large, nl.   Eczema    GERD (gastroesophageal reflux disease)    HFrEF (heart failure with reduced ejection fraction) [facility]   a. 12/2017 Echo: EF 30-35%, gr2 DD. Mild to mod MR. Mod dil LA. Nl RV fxn. Mildly dil RA. PASP 4521m; b. 04/2018 Echo: EF 30-35%, diff HK. Nl RV  fxn.   Hyperlipidemia    Hypertension    Mallory-Weiss tear    with upper gi bleed 12/2013   [place] [place]mic [facility]) [place]  a. 12/2017 Echo: EF 30-35%, gr2 DD; b. 04/2018 Echo: EF 30-35%.   Varices, esophageal ([facility]  Past Surgical History:  Procedure Laterality Date   CARDIAC CATHETERIZATION     CATARACT EXTRACTION W/PHACO Left 01/21/2017   Procedure: CATARACT EXTRACTION PHACO AND INTRAOCULAR LENS PLACEMENT (IOC);  Surgeon: WilBirder RobsonD;  Location: [facility] ORS;  Service: Ophthalmology;  Laterality: Left;  [place]:48 AP% 21.6 CDE 10.39 Fluid pack lot # [id number]  CATARACT EXTRACTION W/PHACO Right 02/11/2017   Procedure: CATARACT EXTRACTION PHACO AND INTRAOCULAR LENS PLACEMENT (IOC);  Surgeon: WilBirder RobsonD;  Location: [facility] ORS;  Service: Ophthalmology;  Laterality: Right;  [place]:42 AP% 22.4 CDE 9.47 Fluid pack lot # [id number]  COLONOSCOPY     COLONOSCOPY WITH PROPOFOL N/A 02/09/2020   Procedure: COLONOSCOPY WITH PROPOFOL;  Surgeon: Toledo, TeoBenay PikeD;  Location: [facility] ENDOSCOPY;  Service: Gastroenterology;  Laterality: N/A;   ESOPHAGOGASTRODUODENOSCOPY     ESOPHAGOGASTRODUODENOSCOPY (EGD) WITH PROPOFOL N/A 02/09/2020   Procedure: ESOPHAGOGASTRODUODENOSCOPY (EGD) WITH PROPOFOL;  Surgeon: Toledo, TeoBenay PikeD;  Location: [facility];  Service: Gastroenterology;  Laterality: N/A;   HERNIA REPAIR     JOINT REPLACEMENT     TKR   PROSTATE SURGERY     REPLACEMENT TOTAL KNEE BILATERAL Bilateral    RIGHT/LEFT HEART CATH AND CORONARY ANGIOGRAPHY N/A 12/31/2017   Procedure: RIGHT/LEFT HEART CATH  AND CORONARY ANGIOGRAPHY;  Surgeon: Nelva Bush, MD;  Location: [facility];  Service: Cardiovascular;  Laterality: N/A;    Current Meds  Medication Sig   aspirin EC 81 MG tablet Take 81 mg by mouth daily.   atorvastatin (LIPITOR) 20 MG tablet TAKE 1 TABLET DAILY AT 6PM   carvedilol (COREG) 6.25 MG tablet Take 1 tablet (6.25 mg total) by mouth 2 (two) times  daily.   Cyanocobalamin 3000 MCG SUBL Place 3,000 mcg under the tongue once a week.    Dupilumab, Asthma, (DUPIXENT [place]) Inject 300 mg into the skin every 14 (fourteen) days.   famotidine (PEPCID) 40 MG tablet Take 40 mg by mouth daily.   folic acid (FOLVITE) 1 MG tablet Take 1 mg by mouth daily.   furosemide (LASIX) 20 MG tablet Take 1 tablet (20 mg total) by mouth daily.   pantoprazole (PROTONIX) 40 MG tablet Take 40 mg by mouth 2 (two) times daily.    sacubitril-valsartan (ENTRESTO) 24-26 MG Take 1 tablet by mouth 2 (two) times daily.   spironolactone (ALDACTONE) 25 MG tablet Take 1 tablet (25 mg total) by mouth daily.   tadalafil (CIALIS) 10 MG tablet Take 10 mg by mouth daily as needed for erectile dysfunction.    Allergies: Lamisil [terbinafine]  Social History   Tobacco Use   Smoking status: Former    Packs/day: 0.25    Years: 44.00    Pack years: 11.00    Types: Cigarettes    Quit date: 2004    Years since quitting: 18.6   Smokeless tobacco: Never   Tobacco comments:    03/23/04  Vaping Use   Vaping Use: Never used  Substance Use Topics   Alcohol use: Yes    Comment: occassional beer   Drug use: No    Family History  Problem Relation Age of Onset   Brain cancer Mother     Review of Systems: A 12-system review of systems was performed and was negative except as noted in the HPI.  --------------------------------------------------------------------------------------------------  Physical Exam: BP 110/60 (BP Location: Left Arm, Patient Position: Sitting, Cuff Size: Normal)   Pulse 67   Ht '5\' 10"'$  (1.778 m)   Wt 176 lb (79.8 kg)   SpO2 96%   BMI 25.25 kg/m   General:  NAD. Neck: No JVD or HJR. Lungs: Clear to auscultation bilaterally without wheezes or crackles. Heart: Regular rate and rhythm with occasional extrasystoles.  No murmurs, rubs, or gallops. Abdomen: Soft, nontender, nondistended. Extremities: No lower extremity edema.  EKG: Normal sinus  rhythm with PACs.  Compared with prior tracing from 01/24/2021, PVCs are no longer present.  Otherwise, no significant interval change.  Lab Results  Component Value Date   WBC 7.4 12/31/2017   HGB 13.9 12/31/2017   HCT 41.3 12/31/2017   MCV 91.8 12/31/2017   PLT 136 (L) 12/31/2017    Lab Results  Component Value Date   NA 139 05/07/2021   K 3.9 05/07/2021   CL 104 05/07/2021   CO2 29 05/07/2021   BUN 18 05/07/2021   CREATININE 1.27 (H) 05/07/2021   GLUCOSE 104 (H) 05/07/2021   ALT 23 10/07/2018    Lab Results  Component Value Date   CHOL 153 10/07/2018   HDL 68 10/07/2018   LDLCALC 50 10/07/2018   TRIG 176 (H) 10/07/2018   CHOLHDL 2.3 10/07/2018    --------------------------------------------------------------------------------------------------  ASSESSMENT AND PLAN: Chronic HFrEF due to nonischemic cardiomyopathy: Mr. Shaler continues to do well with  NYHA class I symptoms.  He appears euvolemic on examination today.  We will continue his current regimen of GDMT consisting of carvedilol, Entresto, and spironolactone.  Intermittent lightheadedness in the setting of borderline low blood pressure precludes escalation today.  Recent labs through Dr. Ammie Ferrier office showed stable renal function and electrolytes.  Nonobstructive coronary artery disease and mixed hyperlipidemia: No angina reported.  Given suboptimal LDL on recent check by Dr. Sabra Heck (89), we have agreed to escalate atorvastatin to 40 mg daily.  Plan for CMP and lipid panel in about 3 months.  Follow-up: Return to clinic in 6 months.  Nelva Bush, MD 07/25/2021 9:19 AM

## 2021-07-26 ENCOUNTER — Other Ambulatory Visit: Payer: Self-pay | Admitting: Family

## 2021-10-30 ENCOUNTER — Other Ambulatory Visit
Admission: RE | Admit: 2021-10-30 | Discharge: 2021-10-30 | Disposition: A | Discharge status: Home / Self Care | Payer: Medicare HMO | Source: Ambulatory Visit | Attending: Internal Medicine | Admitting: Internal Medicine

## 2021-10-30 ENCOUNTER — Telehealth: Payer: Self-pay | Admitting: Internal Medicine

## 2021-10-30 DIAGNOSIS — E782 Mixed hyperlipidemia: Secondary | ICD-10-CM | POA: Diagnosis present

## 2021-10-30 DIAGNOSIS — I428 Other cardiomyopathies: Secondary | ICD-10-CM | POA: Insufficient documentation

## 2021-10-30 DIAGNOSIS — I5022 Chronic systolic (congestive) heart failure: Secondary | ICD-10-CM | POA: Insufficient documentation

## 2021-10-30 LAB — COMPREHENSIVE METABOLIC PANEL
ALT: 24 U/L (ref 0–44)
AST: 28 U/L (ref 15–41)
Albumin: 3.3 g/dL — ABNORMAL LOW (ref 3.5–5.0)
Alkaline Phosphatase: 52 U/L (ref 38–126)
Anion gap: 7 (ref 5–15)
BUN: 25 mg/dL — ABNORMAL HIGH (ref 8–23)
CO2: 31 mmol/L (ref 22–32)
Calcium: 9 mg/dL (ref 8.9–10.3)
Chloride: 103 mmol/L (ref 98–111)
Creatinine, Ser: 1.13 mg/dL (ref 0.61–1.24)
GFR, Estimated: >60 mL/min (ref >60)
Glucose, Bld: 100 mg/dL — ABNORMAL HIGH (ref 70–99)
Potassium: 3.4 mmol/L — ABNORMAL LOW (ref 3.5–5.1)
Sodium: 141 mmol/L (ref 135–145)
Total Bilirubin: 0.7 mg/dL (ref 0.3–1.2)
Total Protein: 6.6 g/dL (ref 6.5–8.1)

## 2021-10-30 LAB — LIPID PANEL
Cholesterol: 139 mg/dL (ref 0–200)
HDL: 47 mg/dL (ref >40)
LDL Cholesterol: 80 mg/dL (ref 0–99)
Total CHOL/HDL Ratio: 3 RATIO
Triglycerides: 58 mg/dL (ref <150)
VLDL: 12 mg/dL (ref 0–40)

## 2021-10-30 NOTE — Telephone Encounter (Signed)
Patient came by office Dropped off patient assistance forms to be completed Placed in nurse box

## 2021-10-31 ENCOUNTER — Telehealth: Payer: Self-pay | Admitting: *Deleted

## 2021-10-31 MED ORDER — ENTRESTO 24-26 MG PO TABS: 1.0000 | ORAL_TABLET | Freq: Two times a day (BID) | ORAL | 3 refills | Status: DC

## 2021-10-31 NOTE — Telephone Encounter (Signed)
Patient returning call for results and poc .   Patient working until 3 tomorrow if no ans ok to leave detailed results and instructions.

## 2021-10-31 NOTE — Telephone Encounter (Signed)
Attempted to call pt to discuss needing proof of income to send with PAF application for Entresto.  No answer. Lmtcb.  Application currently with this RN.  Will send with income information that is required once received.

## 2021-10-31 NOTE — Telephone Encounter (Signed)
Attempted to call pt. No answer. Lmtcb.  

## 2021-10-31 NOTE — Telephone Encounter (Signed)
-----   Message from Nelva Bush, MD sent at 10/31/2021  8:53 AM EST ----- Please let [name] know that his kidney function and liver function are stable.  His LDL (bad cholesterol) has risen slightly but is still in a reasonable range.  His potassium is borderline low.  I recommend that he try to increase the potassium in his diet and continue his current medications.

## 2021-11-01 NOTE — Telephone Encounter (Signed)
Patient came by office to give [place] in nurse box

## 2021-11-01 NOTE — Telephone Encounter (Signed)
The patient has been notified of the result and verbalized understanding.  All questions (if any) were answered.     

## 2021-11-01 NOTE — Telephone Encounter (Signed)
Patient is calling you back. He requests a call before 8:45 or after 3 pm

## 2021-11-01 NOTE — Telephone Encounter (Signed)
Pt returned call. Notified of requirement of proof of income.  Pt will bring annual social security statement to front office.  Notified pt once received I will send completed PAF application.  Pt voiced understanding.

## 2021-11-01 NOTE — Telephone Encounter (Signed)
Attempted to call pt. No answer. Left detailed message per pt preference below.  Advised pt call back to discuss further.

## 2021-11-01 NOTE — Telephone Encounter (Signed)
See subsequent telephone encounter, pt states may leave detailed message and will return call after work.  Attempted to call pt. No answer. LDM stating below information. Advised pt call me back to discuss when he is able.

## 2021-11-02 NOTE — Telephone Encounter (Signed)
Called pt and notified on vm that I received paperwork that he dropped off and that I have faxed completed paperwork. I will let him know as soon as I hear a response. Advised pt he may call back if he feels he needs to discuss further.   PAF application faxed to [employer] @ [phone] with confirmation received.  Paperwork placed in [employer].

## 2021-11-13 ENCOUNTER — Telehealth: Payer: Self-pay | Admitting: Internal Medicine

## 2021-11-13 NOTE — Telephone Encounter (Signed)
Fax confirmation received for [employer] Patient Assistance.

## 2021-11-13 NOTE — Telephone Encounter (Signed)
Fax received from [employer] Patient Assistance for [employer].  This stated that the patient's application that was faxed on 11/02/21 was "Incomplete."  We have received the enrollment application for [facility], but we are missing the following information from you:  - Household Size  I called the patient and advised him of the above. I have confirmed with him that his household size is "1".  I have updated his application and refaxed this to [employer] at [phone].  Awaiting fax confirmation.

## 2022-01-01 NOTE — Telephone Encounter (Signed)
Called and spoke with pt. Notified received fax from [employer] PAF stating pt has been approved and is eligible to receive Entresto until 12/22/2022 at no cost to pt. Pt voiced understanding. Paperwork placed in [employer].

## 2022-01-23 NOTE — Progress Notes (Signed)
Follow-up Outpatient Visit Date: 01/25/2022  Primary Care Provider: Rusty Aus, MD [place] [id number]  Chief Complaint: Follow-up HFrEF and nonobstructive CAD  HPI:  Earl Hays is a 81 y.o. male with history of chronic HFrEF due to nonischemic cardiomyopathy (LVEF improved to 45-50% on most recent echo in 01/2020), nonobstructive CAD, esophageal varices with upper GI bleed, recurrent epistaxis, and eczema, who presents for follow-up of heart failure.  I last saw him in August, at which time he was feeling well other than mild lightheadedness while at work.  Blood pressure was low normal at that time.  We did not make any changes to his goal-directed medical therapy.  Due to suboptimal LDL control in the setting of nonobstructive coronary artery disease, we agreed to increase atorvastatin to 40 mg daily.  LDL had improved to 80 on recent check in November but was back up to 96 on recent evaluation with Dr. Sabra Heck a few weeks ago.  Today, Earl Hays reports that he is doing fairly well.  He had severe neck pain a few weeks ago and was seen in the walk-in clinic.  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  Pain has since resolved.  His only other complaint is of intermittent discomfort near the right temporomandibular joint when chewing.  He is edentulous and wears dentures.  He denies chest pain, shortness of breath, palpitations, lightheadedness, and edema.  He is not exercising regularly.  He is tolerating his medications well.  He denies bleeding.  --------------------------------------------------------------------------------------------------  Past Medical History:  Diagnosis Date   Anemia    CHF (congestive heart failure) (HCC)    Colon adenoma    Coronary artery disease    a. 12/2017 Cath: LM min irregs, LAD 40ost, 69m, RI 60, LCX 63m, RCA large, nl.   Eczema    GERD (gastroesophageal reflux disease)    HFrEF (heart  failure with reduced ejection fraction) ([place])    a. 12/2017 Echo: EF 30-35%, gr2 DD. Mild to mod MR. Mod dil LA. Nl RV fxn. Mildly dil RA. PASP 73mmHg; b. 04/2018 Echo: EF 30-35%, diff HK. Nl RV fxn.   Hyperlipidemia    Hypertension    Mallory-Weiss tear    with upper gi bleed 12/2013   NICM (nonischemic cardiomyopathy) ([place])    a. 12/2017 Echo: EF 30-35%, gr2 DD; b. 04/2018 Echo: EF 30-35%.   Varices, esophageal ([place])    Past Surgical History:  Procedure Laterality Date   CARDIAC CATHETERIZATION     CATARACT EXTRACTION W/PHACO Left 01/21/2017   Procedure: CATARACT EXTRACTION PHACO AND INTRAOCULAR LENS PLACEMENT (IOC);  Surgeon: Birder Robson, MD;  Location: [facility] ORS;  Service: Ophthalmology;  Laterality: Left;  [place] 00:48 AP% 21.6 CDE 10.39 Fluid pack lot # [id number]   CATARACT EXTRACTION W/PHACO Right 02/11/2017   Procedure: CATARACT EXTRACTION PHACO AND INTRAOCULAR LENS PLACEMENT (IOC);  Surgeon: Birder Robson, MD;  Location: [facility];  Service: Ophthalmology;  Laterality: Right;  [place] 00:42 AP% 22.4 CDE 9.47 Fluid pack lot # [id number]   COLONOSCOPY     COLONOSCOPY WITH PROPOFOL N/A 02/09/2020   Procedure: COLONOSCOPY WITH PROPOFOL;  Surgeon: Toledo, Benay Pike, MD;  Location: [facility];  Service: Gastroenterology;  Laterality: N/A;   ESOPHAGOGASTRODUODENOSCOPY     ESOPHAGOGASTRODUODENOSCOPY (EGD) WITH PROPOFOL N/A 02/09/2020   Procedure: ESOPHAGOGASTRODUODENOSCOPY (EGD) WITH PROPOFOL;  Surgeon: Toledo, Benay Pike, MD;  Location: [facility];  Service: Gastroenterology;  Laterality: N/A;  HERNIA REPAIR     JOINT REPLACEMENT     TKR   PROSTATE SURGERY     REPLACEMENT TOTAL KNEE BILATERAL Bilateral    RIGHT/LEFT HEART CATH AND CORONARY ANGIOGRAPHY N/A 12/31/2017   Procedure: RIGHT/LEFT HEART CATH AND CORONARY ANGIOGRAPHY;  Surgeon: Nelva Bush, MD;  Location: [facility];  Service: Cardiovascular;  Laterality: N/A;    Current Meds  Medication Sig   aspirin EC 81 MG  tablet Take 81 mg by mouth daily.   atorvastatin (LIPITOR) 40 MG tablet Take 1 tablet (40 mg total) by mouth daily.   carvedilol (COREG) 6.25 MG tablet Take 1 tablet (6.25 mg total) by mouth 2 (two) times daily.   Cyanocobalamin 3000 MCG SUBL Place 3,000 mcg under the tongue once a week.    Dupilumab, Asthma, (DUPIXENT [place]) Inject 300 mg into the skin every 14 (fourteen) days.   famotidine (PEPCID) 40 MG tablet Take 40 mg by mouth daily.   folic acid (FOLVITE) 1 MG tablet Take 1 mg by mouth daily.   furosemide (LASIX) 20 MG tablet Take 1 tablet (20 mg total) by mouth daily.   pantoprazole (PROTONIX) 40 MG tablet Take 40 mg by mouth 2 (two) times daily.    sacubitril-valsartan (ENTRESTO) 24-26 MG Take 1 tablet by mouth 2 (two) times daily.   spironolactone (ALDACTONE) 25 MG tablet Take 1 tablet (25 mg total) by mouth daily.   tadalafil (CIALIS) 10 MG tablet Take 10 mg by mouth daily as needed for erectile dysfunction.    Allergies: Lamisil [terbinafine]  Social History   Tobacco Use   Smoking status: Former    Packs/day: 0.25    Years: 44.00    Pack years: 11.00    Types: Cigarettes    Quit date: 2004    Years since quitting: 19.1   Smokeless tobacco: Never   Tobacco comments:    03/23/04  Vaping Use   Vaping Use: Never used  Substance Use Topics   Alcohol use: Yes    Comment: occassional beer   Drug use: No    Family History  Problem Relation Age of Onset   Brain cancer Mother     Review of Systems: A 12-system review of systems was performed and was negative except as noted in the HPI.  --------------------------------------------------------------------------------------------------  Physical Exam: BP 110/70 (BP Location: Left Arm, Patient Position: Sitting, Cuff Size: Normal)    Pulse 87    Ht 5\' 9"  (1.753 m)    Wt 178 lb 6 oz (80.9 kg)    SpO2 99%    BMI 26.34 kg/m   General:  NAD. Neck: No JVD or HJR. Lungs: Clear to auscultation bilaterally without wheezes or  crackles. Heart: Regular rate with occasional extrasystoles.  No murmurs, rubs, or gallops. Abdomen: Soft, nontender, nondistended. Extremities: No lower extremity edema.  EKG: Normal sinus rhythm with frequent PACs, left axis deviation, and nonspecific T wave changes.  No significant change from prior tracing on 07/25/2021.  Lab Results  Component Value Date   WBC 7.4 12/31/2017   HGB 13.9 12/31/2017   HCT 41.3 12/31/2017   MCV 91.8 12/31/2017   PLT 136 (L) 12/31/2017    Lab Results  Component Value Date   NA 141 10/30/2021   K 3.4 (L) 10/30/2021   CL 103 10/30/2021   CO2 31 10/30/2021   BUN 25 (H) 10/30/2021   CREATININE 1.13 10/30/2021   GLUCOSE 100 (H) 10/30/2021   ALT 24 10/30/2021    Lab  Results  Component Value Date   CHOL 139 10/30/2021   HDL 47 10/30/2021   LDLCALC 80 10/30/2021   TRIG 58 10/30/2021   CHOLHDL 3.0 10/30/2021    --------------------------------------------------------------------------------------------------  ASSESSMENT AND PLAN: Chronic HFrEF due to nonischemic cardiomyopathy with recovered ejection fraction: Earl Hays appears euvolemic reports stable NYHA class I symptoms.  Most recent echo in 2021 showed some improvement in LVEF to 45-50%.  Blood pressure is low normal today.  We will continue current regimen of carvedilol, Entresto, and spironolactone.  Given only mildly reduced LVEF and class I symptoms, will defer challenging with SGLT2 inhibitor.  Coronary artery disease: No angina reported.  Prior catheterization showed nonobstructive disease with up to 40% LAD and 60% ramus intermedius disease.  Continue aspirin and statin therapy to prevent progression of disease.  Hyperlipidemia: LDL mildly above goal, up to 96 on last check with Dr. Sabra Heck.  Continue atorvastatin 40 mg daily.  I encouraged Earl Hays to work on lifestyle modifications.  Follow-up: Return to clinic in 6 months.  Nelva Bush, MD 01/25/2022 9:43 AM

## 2022-01-25 ENCOUNTER — Ambulatory Visit: Payer: Medicare HMO | Admitting: Internal Medicine

## 2022-01-25 ENCOUNTER — Encounter: Payer: Self-pay | Admitting: Internal Medicine

## 2022-01-25 ENCOUNTER — Other Ambulatory Visit: Payer: Self-pay

## 2022-01-25 VITALS — BP 110/70 | HR 87 | Ht 69.0 in | Wt 178.4 lb

## 2022-01-25 DIAGNOSIS — I428 Other cardiomyopathies: Secondary | ICD-10-CM

## 2022-01-25 DIAGNOSIS — I10 Essential (primary) hypertension: Secondary | ICD-10-CM

## 2022-01-25 DIAGNOSIS — I2510 Atherosclerotic heart disease of native coronary artery without angina pectoris: Secondary | ICD-10-CM | POA: Diagnosis not present

## 2022-01-25 DIAGNOSIS — I5022 Chronic systolic (congestive) heart failure: Secondary | ICD-10-CM

## 2022-01-25 DIAGNOSIS — E785 Hyperlipidemia, unspecified: Secondary | ICD-10-CM | POA: Diagnosis not present

## 2022-01-25 NOTE — Patient Instructions (Signed)
Medication Instructions:   Your physician recommends that you continue on your current medications as directed. Please refer to the Current Medication list given to you today.  *If you need a refill on your cardiac medications before your next appointment, please call your pharmacy*   Lab Work:  None ordered  Testing/Procedures:  None ordered   Follow-Up: At [facility], you and your health needs are our priority.  As part of our continuing mission to provide you with exceptional heart care, we have created designated Provider Care Teams.  These Care Teams include your primary Cardiologist (physician) and Advanced Practice Providers (APPs -  Physician Assistants and Nurse Practitioners) who all work together to provide you with the care you need, when you need it.  We recommend signing up for the patient portal called "MyChart".  Sign up information is provided on this After Visit Summary.  MyChart is used to connect with patients for Virtual Visits (Telemedicine).  Patients are able to view lab/test results, encounter notes, upcoming appointments, etc.  Non-urgent messages can be sent to your provider as well.   To learn more about what you can do with MyChart, go to [email or web address].    Your next appointment:   6 month(s)  The format for your next appointment:   In Person  Provider:   You may see Nelva Bush, MD or one of the following Advanced Practice Providers on your designated Care Team:   Murray Hodgkins, NP Christell Faith, PA-C Cadence Kathlen Mody, [place]

## 2022-01-26 ENCOUNTER — Encounter: Payer: Self-pay | Admitting: Internal Medicine

## 2022-01-29 ENCOUNTER — Ambulatory Visit: Admit: 2022-01-29 | Discharge: 2022-01-29 | Payer: MEDICARE

## 2022-01-29 DIAGNOSIS — L309 Dermatitis, unspecified: Principal | ICD-10-CM

## 2022-01-29 DIAGNOSIS — L2084 Intrinsic (allergic) eczema: Principal | ICD-10-CM

## 2022-01-29 DIAGNOSIS — R21 Rash and other nonspecific skin eruption: Principal | ICD-10-CM

## 2022-01-29 MED ORDER — DUPILUMAB 300 MG/2 ML SUBCUTANEOUS SYRINGE
SUBCUTANEOUS | 11 refills | 0.00000 days | Status: CP
Start: 2022-01-29 — End: ?

## 2022-01-29 NOTE — Unmapped (Signed)
DERMATOLOGY CLINIC NOTE    ASSESSMENT AND PLAN:     Intrinsic atopic dermatitis, excellent improvement on Dupixent:  - dupilumab (DUPIXENT) 300 mg/2 mL Syrg injection; INJECT 1 SYRINGE EVERY OTHER WEEK FOR MAINTENANCE  Dispense: 4 mL; Refill: 11  -No need for topicals currently.  Recommended good moisturization.  -Tolerating well.    Return to clinic:  1 year or sooner as needed.    CHIEF COMPLAINT:  Follow-up eczema     HPI:   This is a pleasant 81 y.o. male who last saw me on 01/30/2021.  At that time he was seen for atopic dermatitis which was well controlled on Dupixent.  He returns today for follow-up.  He is doing very well and very happy with his level of improvement.  He has no current need for topical steroids.  He is tolerating the medication well.    Past treatments for him have included topical steroids, ivermectin in/permethrin, methotrexate    PAST MEDICAL HISTORY:  Pt otherwise healthy.     MEDICATIONS:   Current Outpatient Medications on File Prior to Visit   Medication Sig Dispense Refill   ??? aspirin (ECOTRIN) 81 MG tablet Take 81 mg by mouth.     ??? atorvastatin (LIPITOR) 20 MG tablet Take 20 mg by mouth.     ??? carvedilol (COREG) 6.25 MG tablet Take 6.25 mg by mouth.     ??? cetirizine (ZYRTEC) 10 MG tablet Take 1 tablet (10 mg total) by mouth daily. 90 tablet 4   ??? cod liver oil Oil Take by mouth.     ??? docosahexaenoic acid-epa 120-180 mg cap Take by mouth.     ??? dupilumab (DUPIXENT PEN) 300 mg/2 mL PnIj Inject 300mg  (one pen) under the skin every 14 days 12 mL 3   ??? dupilumab (DUPIXENT) 300 mg/2 mL Syrg injection INJECT 1 SYRINGE EVERY OTHER WEEK FOR MAINTENANCE 4 mL 1   ??? FLUZONE HIGHDOSE QUAD 20-21 PF 240 mcg/0.7 mL Syrg PHARMACY ADMINISTERED     ??? furosemide (LASIX) 20 MG tablet      ??? furosemide (LASIX) 40 MG tablet Take 40 mg by mouth.     ??? ivermectin (STROMECTOL) 3 mg Tab Take 5 tablets once. Repeat in 1 week. Wash all clothing and bedding in hot water. 10 tablet 0   ??? losartan (COZAAR) 25 MG tablet Take 12.5 mg by mouth.     ??? montelukast (SINGULAIR) 10 mg tablet      ??? pantoprazole (PROTONIX) 40 MG tablet Take 40 mg by mouth.     ??? permethrin (ELIMITE) 5 % cream Thoroughly massage cream from head to soles; leave on for 10 hours before washing. Wash all linens and bedding in hot water. 60 g 1   ??? sacubitril-valsartan (ENTRESTO) 24-26 mg tablet Take 1 tablet by mouth.     ??? spironolactone (ALDACTONE) 25 MG tablet Take 25 mg by mouth.     ??? triamcinolone (KENALOG) 0.1 % ointment Apply twice a day to affected areas as needed. 454 g 6   ??? triamcinolone (KENALOG) 0.1 % ointment Apply topically for itch and rash 2-3 times daily with cake frosting like application. 454 g 0   ??? famotidine (PEPCID) 40 MG tablet Take 40 mg by mouth.       No current facility-administered medications on file prior to visit.       ALLERGIES:   Terbinafine    SOCIAL HISTORY:  Lives in [place]     REVIEW OF SYSTEMS:  Baseline state of health. No recent illnesses. No other skin complaints.     PHYSICAL EXAMINATION:  Examination in the presence of chaperone:  General: Well-developed, well-nourished. No acute distress.   Neuro: Alert and oriented, answers questions appropriately.  Skin: Examination of the upper extremities, back, hands was performed and notable for the following:  Diffuse xerosis  No active eczema     Dictation software was used while making this note. Please excuse any errors made with dictation software.

## 2022-03-09 ENCOUNTER — Other Ambulatory Visit: Payer: Self-pay

## 2022-03-09 DIAGNOSIS — I10 Essential (primary) hypertension: Secondary | ICD-10-CM | POA: Insufficient documentation

## 2022-03-09 DIAGNOSIS — J45909 Unspecified asthma, uncomplicated: Secondary | ICD-10-CM | POA: Diagnosis not present

## 2022-03-09 DIAGNOSIS — R1031 Right lower quadrant pain: Secondary | ICD-10-CM | POA: Diagnosis not present

## 2022-03-09 DIAGNOSIS — R112 Nausea with vomiting, unspecified: Secondary | ICD-10-CM | POA: Diagnosis not present

## 2022-03-09 DIAGNOSIS — R1030 Lower abdominal pain, unspecified: Secondary | ICD-10-CM | POA: Insufficient documentation

## 2022-03-09 DIAGNOSIS — R1032 Left lower quadrant pain: Secondary | ICD-10-CM | POA: Diagnosis not present

## 2022-03-09 NOTE — ED Triage Notes (Signed)
Lower abd pain, onset tonight, with nausea/vomiting. ?

## 2022-03-10 ENCOUNTER — Emergency Department: Payer: Medicare HMO

## 2022-03-10 ENCOUNTER — Encounter: Payer: Self-pay | Admitting: Emergency Medicine

## 2022-03-10 ENCOUNTER — Other Ambulatory Visit: Payer: Self-pay

## 2022-03-10 ENCOUNTER — Emergency Department
Admission: EM | Admit: 2022-03-10 | Discharge: 2022-03-10 | Disposition: A | Discharge status: Home / Self Care | Payer: Medicare HMO | Attending: Emergency Medicine | Admitting: Emergency Medicine

## 2022-03-10 ENCOUNTER — Emergency Department
Admission: EM | Admit: 2022-03-10 | Discharge: 2022-03-10 | Disposition: A | Discharge status: Home / Self Care | Payer: Medicare HMO | Source: Home / Self Care | Attending: Emergency Medicine | Admitting: Emergency Medicine

## 2022-03-10 DIAGNOSIS — R1032 Left lower quadrant pain: Secondary | ICD-10-CM | POA: Insufficient documentation

## 2022-03-10 DIAGNOSIS — R112 Nausea with vomiting, unspecified: Secondary | ICD-10-CM | POA: Insufficient documentation

## 2022-03-10 DIAGNOSIS — R1031 Right lower quadrant pain: Secondary | ICD-10-CM | POA: Insufficient documentation

## 2022-03-10 DIAGNOSIS — R1030 Lower abdominal pain, unspecified: Secondary | ICD-10-CM

## 2022-03-10 LAB — COMPREHENSIVE METABOLIC PANEL
ALT: 19 U/L (ref 0–44)
AST: 23 U/L (ref 15–41)
Albumin: 4 g/dL (ref 3.5–5.0)
Alkaline Phosphatase: 64 U/L (ref 38–126)
Anion gap: 7 (ref 5–15)
BUN: 19 mg/dL (ref 8–23)
CO2: 30 mmol/L (ref 22–32)
Calcium: 9 mg/dL (ref 8.9–10.3)
Chloride: 100 mmol/L (ref 98–111)
Creatinine, Ser: 1.32 mg/dL — ABNORMAL HIGH (ref 0.61–1.24)
GFR, Estimated: 55 mL/min — ABNORMAL LOW (ref >60)
Glucose, Bld: 141 mg/dL — ABNORMAL HIGH (ref 70–99)
Potassium: 3.9 mmol/L (ref 3.5–5.1)
Sodium: 137 mmol/L (ref 135–145)
Total Bilirubin: 0.8 mg/dL (ref 0.3–1.2)
Total Protein: 7.3 g/dL (ref 6.5–8.1)

## 2022-03-10 LAB — CBC
HCT: 42.9 % (ref 39.0–52.0)
Hemoglobin: 14 g/dL (ref 13.0–17.0)
MCH: 29.4 pg (ref 26.0–34.0)
MCHC: 32.6 g/dL (ref 30.0–36.0)
MCV: 90.1 fL (ref 80.0–100.0)
Platelets: 159 10*3/uL (ref 150–400)
RBC: 4.76 MIL/uL (ref 4.22–5.81)
RDW: 12.4 % (ref 11.5–15.5)
WBC: 7.7 10*3/uL (ref 4.0–10.5)
nRBC: 0 % (ref 0.0–0.2)

## 2022-03-10 LAB — URINALYSIS, ROUTINE W REFLEX MICROSCOPIC
Bacteria, UA: NONE SEEN
Bilirubin Urine: NEGATIVE
Glucose, UA: NEGATIVE mg/dL
Hgb urine dipstick: NEGATIVE
Ketones, ur: NEGATIVE mg/dL
Leukocytes,Ua: NEGATIVE
Nitrite: NEGATIVE
Protein, ur: 30 mg/dL — AB
Specific Gravity, Urine: 1.026 (ref 1.005–1.030)
pH: 5 (ref 5.0–8.0)

## 2022-03-10 LAB — LIPASE, BLOOD: Lipase: 37 U/L (ref 11–51)

## 2022-03-10 MED ORDER — IOHEXOL 350 MG/ML SOLN
100.0000 mL | Freq: Once | INTRAVENOUS | Status: AC | PRN
  Administered 2022-03-10: 100 mL via INTRAVENOUS

## 2022-03-10 MED ORDER — MORPHINE SULFATE (PF) 4 MG/ML IV SOLN
4.0000 mg | Freq: Once | INTRAVENOUS | Status: AC
Start: 2022-03-10 — End: 2022-03-10
  Administered 2022-03-10: 4 mg via INTRAVENOUS
  Filled 2022-03-10: qty 1

## 2022-03-10 MED ORDER — ONDANSETRON HCL 4 MG/2ML IJ SOLN
4.0000 mg | Freq: Once | INTRAMUSCULAR | Status: AC
  Administered 2022-03-10: 4 mg via INTRAVENOUS
  Filled 2022-03-10: qty 2

## 2022-03-10 MED ORDER — LACTATED RINGERS IV BOLUS
1000.0000 mL | Freq: Once | INTRAVENOUS | Status: AC
Start: 2022-03-10 — End: 2022-03-10
  Administered 2022-03-10: 1000 mL via INTRAVENOUS

## 2022-03-10 MED ORDER — ONDANSETRON 4 MG PO TBDP: 4.0000 mg | ORAL_TABLET | Freq: Three times a day (TID) | ORAL | 0 refills | Status: DC | PRN

## 2022-03-10 NOTE — ED Provider Notes (Signed)
[facility] Provider Note    Event Date/Time   First MD Initiated Contact with Patient 03/10/22 0005     (approximate)   History   Abdominal Pain   HPI  Earl Hays is a 81 y.o. male who presents to the ED for evaluation of Abdominal Pain   PCP visit from 3/6.  History of reduced ejection fraction, GERD, HLD, HTN, dupilumab for eczema. Allergies and asthma.   Patient presents to the ED for evaluation of acute lower abdominal pain, nausea, emesis.  He reports pain starting around 7 PM, worsening around 9 PM when he was eating a hot dog.  Reports severe diffuse lower abdominal pain that he has never experienced before.  Reports having 3 bowel movements since the initiation of the pain without diarrhea, melena or hematochezia.  Reports nauseousness without emesis, until he arrived to the ED and does have some nonbloody nonbilious emesis here.  Reports a history of remote bilateral laparoscopic inguinal hernia repairs without other intra-abdominal surgical history.  Physical Exam   Triage Vital Signs: ED Triage Vitals  Enc Vitals Group     BP 03/09/22 2355 137/79     Pulse Rate 03/09/22 2355 81     Resp 03/09/22 2355 18     Temp 03/09/22 2355 98.1 F (36.7 C)     Temp Source 03/09/22 2355 Oral     SpO2 03/09/22 2355 99 %     Weight 03/09/22 2356 170 lb (77.1 kg)     Height --      Head Circumference --      Peak Flow --      Pain Score 03/09/22 2355 10     Pain Loc --      Pain Edu? --      Excl. in [place]? --     Most recent vital signs: Vitals:   03/09/22 2355  BP: 137/79  Pulse: 81  Resp: 18  Temp: 98.1 F (36.7 C)  SpO2: 99%    General: Awake, no distress.  Appears uncomfortable, sitting upright. CV:  Good peripheral perfusion.  RRR Resp:  Normal effort.  CTA B Abd:  Mild distention.  Benign exam throughout without any guarding or peritoneal features.  Seems diffusely tender, but tolerates deep examination quite well. MSK:  No  deformity noted.  Neuro:  No focal deficits appreciated. Other:     ED Results / Procedures / Treatments   Labs (all labs ordered are listed, but only abnormal results are displayed) Labs Reviewed  COMPREHENSIVE METABOLIC PANEL - Abnormal; Notable for the following components:      Result Value   Glucose, Bld 141 (*)    Creatinine, Ser 1.32 (*)    GFR, Estimated 55 (*)    All other components within normal limits  URINALYSIS, ROUTINE W REFLEX MICROSCOPIC - Abnormal; Notable for the following components:   Color, Urine YELLOW (*)    APPearance CLEAR (*)    Protein, ur 30 (*)    All other components within normal limits  LIPASE, BLOOD  CBC    EKG   RADIOLOGY CTA abd/pelv reviewed by me without evidence of acute pathology  Official radiology report(s): CT Angio Abd/Pel W and/or Wo Contrast  Result Date: 03/10/2022 CLINICAL DATA:  Severe lower abdominal EXAM: CTA ABDOMEN AND PELVIS WITHOUT AND WITH CONTRAST TECHNIQUE: Multidetector CT imaging of the abdomen and pelvis was performed using the standard protocol during bolus administration of intravenous contrast. Multiplanar reconstructed images and MIPs  were obtained and reviewed to evaluate the vascular anatomy. RADIATION DOSE REDUCTION: This exam was performed according to the departmental dose-optimization program which includes automated exposure control, adjustment of the mA and/or kV according to patient size and/or use of iterative reconstruction technique. CONTRAST:  OMNIPAQUE IOHEXOL 350 MG/ML SOLN COMPARISON:  None. FINDINGS: VASCULAR Aorta: Widely patent. No aneurysm or dissection. Mild atherosclerotic calcification. No periaortic inflammatory change. Celiac: Patent without evidence of aneurysm, dissection, vasculitis or significant stenosis. SMA: Patent without evidence of aneurysm, dissection, vasculitis or significant stenosis. Renals: Dual right and single left renal arteries. Widely patent. Normal vascular  morphology. No aneurysm or dissection. IMA: Patent without evidence of aneurysm, dissection, vasculitis or significant stenosis. Inflow: Patent without evidence of aneurysm, dissection, vasculitis or significant stenosis. Proximal Outflow: Bilateral common femoral and visualized portions of the superficial and profunda femoral arteries are patent without evidence of aneurysm, dissection, vasculitis or significant stenosis. Veins: Abdominal and pelvic venous vasculature demonstrates normal anatomic configuration and is widely patent. Review of the MIP images confirms the above findings. NON-VASCULAR Lower chest: Visualized lung bases are clear. Mild cardiomegaly. Fluid distension of the distal esophagus on arterial phase imaging appears evacuated on venous phase imaging in may reflect intermittent gastroesophageal reflux. Hepatobiliary: Innumerable cysts are seen scattered throughout the liver. No enhancing intrahepatic mass. No intra or extrahepatic biliary ductal dilation. Portal vein is patent. Gallbladder unremarkable. Pancreas: Unremarkable Spleen: Unremarkable Adrenals/Urinary Tract: The adrenal glands are unremarkable. Mild malrotation of the left kidney the kidneys are normal in size and demonstrate normal cortical enhancement. Multiple scattered simple cysts are seen within the kidneys bilaterally. No enhancing intrarenal masses. No hydronephrosis. No intrarenal or ureteral calculi. The bladder is decompressed. Stomach/Bowel: Stomach is within normal limits. Appendix appears normal. No evidence of bowel wall thickening, distention, or inflammatory changes. No free intraperitoneal gas or fluid. Lymphatic: No pathologic adenopathy within the abdomen and pelvis. Reproductive: Mild prostatic enlargement with indentation of the bladder base. Other: Bilateral inguinal hernia repair with mesh has been performed. Tiny fat containing umbilical hernia. Rectum unremarkable. Musculoskeletal: No acute bone abnormality.  No lytic or blastic bone lesion. Degenerative changes are seen within the lumbar spine. IMPRESSION: VASCULAR No significant vascular abnormality identified within the abdomen and pelvis. Mild atherosclerotic calcification. NON-VASCULAR No acute intra-abdominal pathology identified. No definite radiographic explanation for the patient's reported symptoms. Electronically Signed   By: Helyn Numbers M.D.   On: 03/10/2022 02:29    PROCEDURES and INTERVENTIONS:  .1-3 Lead EKG Interpretation Performed by: Delton Prairie, MD Authorized by: Delton Prairie, MD     Interpretation: normal     ECG rate:  80   ECG rate assessment: normal     Rhythm: sinus rhythm     Ectopy: none     Conduction: normal    Medications  morphine (PF) 4 MG/ML injection 4 mg (4 mg Intravenous Given 03/10/22 0046)  ondansetron (ZOFRAN) injection 4 mg (4 mg Intravenous Given 03/10/22 0053)  lactated ringers bolus 1,000 mL (1,000 mLs Intravenous New Bag/Given 03/10/22 0056)  iohexol (OMNIPAQUE) 350 MG/ML injection 100 mL (100 mLs Intravenous Contrast Given 03/10/22 0135)     IMPRESSION / MDM / ASSESSMENT AND PLAN / ED COURSE  I reviewed the triage vital signs and the nursing notes.  81 year old man presents to the ED with acute lower abdominal pain, without evidence of acute pathology and ultimately suitable for outpatient management. He appears uncomfortable on arrival, holding his lower abd/pelvis and has some nonbloody nonbilious emesis  on arrival, but then his clinical picture clears.  He initially has some mild and diffuse abdominal tenderness without any guarding or peritoneal features, and this similarly clears with a benign abdominal examination on my reassessment.  His work-up is benign with normal CBC, CMP with renal dysfunction around baseline and a normal lipase.  Urine without infectious features or microscopic materia to suggest ureterolithiasis.  Due to the mismatch of his presentation and his examination, CTA  abdomen/pelvis obtained and without evidence of mesenteric ischemia, SBO, ureteral stone, constipation, perforation or other intra-abdominal pathology.  Normal image.  After fluid resuscitation and a couple hours of time, he has no return of symptoms and reports feeling well.  Tolerating p.o. intake and I see no barriers to outpatient management at this point.  I considered observation admission considering his presentation, but this is no longer necessary.  We discussed return precautions.  Clinical Course as of 03/10/22 0303  Wynelle Link Mar 10, 2022  0000 When he is brought back to a room, nurse and I evaluate the patient and perform bladder scan.  Unable to find appreciable urinary retention or any bladder volume. [DS]  5409 Actively vomiting [DS]  0258 Reassessed.  Patient with resolution of symptoms.  We discussed benign work-up and uncertain etiology of his symptoms. [DS]    Clinical Course User Index [DS] Delton Prairie, MD     FINAL CLINICAL IMPRESSION(S) / ED DIAGNOSES   Final diagnoses:  Lower abdominal pain  Nausea and vomiting, unspecified vomiting type     Rx / DC Orders   ED Discharge Orders          Ordered    ondansetron (ZOFRAN-ODT) 4 MG disintegrating tablet  Every 8 hours PRN        03/10/22 0301             Note:  This document was prepared using Dragon voice recognition software and may include unintentional dictation errors.   Delton Prairie, MD 03/10/22 [phone]

## 2022-03-10 NOTE — ED Notes (Signed)
D/C and OTC medications and reasons to return to ED discussed with pt, pt verbalized understanding NAD noted. Pt ambulatory with steady gait on D/C. VSS.  ?

## 2022-03-10 NOTE — Discharge Instructions (Signed)
Use Tylenol for pain and fevers.  Up to 1000 mg per dose, up to 4 times per day.  Do not take more than 4000 mg of Tylenol/acetaminophen within 24 hours..  

## 2022-03-10 NOTE — ED Notes (Signed)
Pt presents to ED with c/o of generalized ABD pain, pt recently D/C'ed from ER and states he went home and was still experiencing the same pain. Pt presented with D/C instructions and this RN went over them with pt and asked if he had taken any of the OTC tylenol that was recommenced for pain, pt denies that he did not that and that his D/C instructions were never "went over with him and I didn't know to do that".  ? ? ?Pt is sitting on bed on phone with NAD noted. Pt is A&Ox4. NAD noted at this time.  ?

## 2022-03-10 NOTE — ED Provider Notes (Signed)
? ?[facility] ?Provider Note ? ? Event Date/Time  ? First MD Initiated Contact with Patient 03/10/22 0745   ?  (approximate) ?History  ?Abdominal Pain ? ?HPI ?Earl Hays is a 81 y.o. male who presents for the second time in 24 hours for recurrent lower abdominal pain.  Patient describes 10/10 nonradiating bilateral lower quadrant abdominal pain.  Patient states that earlier today it was associated with nausea and vomiting but denies any subsequent episodes of vomiting.  Patient states that after checking back in to the emergency department during this visit, the pain has significantly improved and states that it is currently a 3/10 in severity.  Patient denies taking any medications prior to presenting to the emergency department again. ?Physical Exam  ?Triage Vital Signs: ?ED Triage Vitals  ?Enc Vitals Group  ?   BP 03/10/22 0649 124/88  ?   Pulse Rate 03/10/22 0649 87  ?   Resp 03/10/22 0649 20  ?   Temp 03/10/22 0649 98.7 ?F (37.1 ?C)  ?   Temp src --   ?   SpO2 03/10/22 0649 95 %  ?   Weight 03/10/22 0653 170 lb (77.1 kg)  ?   Height 03/10/22 0653 '5\' 9"'$  (1.753 m)  ?   Head Circumference --   ?   Peak Flow --   ?   Pain Score 03/10/22 0653 10  ?   Pain Loc --   ?   Pain Edu? --   ?   Excl. in [place]? --   ? ?Most recent vital signs: ?Vitals:  ? 03/10/22 0649 03/10/22 0759  ?BP: 124/88 120/78  ?Pulse: 87 82  ?Resp: 20 19  ?Temp: 98.7 ?F (37.1 ?C)   ?SpO2: 95% 95%  ? ?General: Awake, oriented x4. ?CV:  Good peripheral perfusion.  ?Resp:  Normal effort.  ?Abd:  No distention.  ?Other:  Elderly African-American male laying in bed in no distress sleeping ?ED Results / Procedures / Treatments  ?Official radiology report(s): ?CT Angio Abd/Pel W and/or Wo Contrast ? ?Result Date: 03/10/2022 ?CLINICAL DATA:  Severe lower abdominal EXAM: CTA ABDOMEN AND PELVIS WITHOUT AND WITH CONTRAST TECHNIQUE: Multidetector CT imaging of the abdomen and pelvis was performed using the standard protocol during bolus  administration of intravenous contrast. Multiplanar reconstructed images and MIPs were obtained and reviewed to evaluate the vascular anatomy. RADIATION DOSE REDUCTION: This exam was performed according to the departmental dose-optimization program which includes automated exposure control, adjustment of the mA and/or kV according to patient size and/or use of iterative reconstruction technique. CONTRAST:  167m OMNIPAQUE IOHEXOL 350 MG/ML SOLN COMPARISON:  None. FINDINGS: VASCULAR Aorta: Widely patent. No aneurysm or dissection. Mild atherosclerotic calcification. No periaortic inflammatory change. Celiac: Patent without evidence of aneurysm, dissection, vasculitis or significant stenosis. SMA: Patent without evidence of aneurysm, dissection, vasculitis or significant stenosis. Renals: Dual right and single left renal arteries. Widely patent. Normal vascular morphology. No aneurysm or dissection. IMA: Patent without evidence of aneurysm, dissection, vasculitis or significant stenosis. Inflow: Patent without evidence of aneurysm, dissection, vasculitis or significant stenosis. Proximal Outflow: Bilateral common femoral and visualized portions of the superficial and profunda femoral arteries are patent without evidence of aneurysm, dissection, vasculitis or significant stenosis. Veins: Abdominal and pelvic venous vasculature demonstrates normal anatomic configuration and is widely patent. Review of the MIP images confirms the above findings. NON-VASCULAR Lower chest: Visualized lung bases are clear. Mild cardiomegaly. Fluid distension of the distal esophagus on arterial phase imaging appears  evacuated on venous phase imaging in may reflect intermittent gastroesophageal reflux. Hepatobiliary: Innumerable cysts are seen scattered throughout the liver. No enhancing intrahepatic mass. No intra or extrahepatic biliary ductal dilation. Portal vein is patent. Gallbladder unremarkable. Pancreas: Unremarkable Spleen:  Unremarkable Adrenals/Urinary Tract: The adrenal glands are unremarkable. Mild malrotation of the left kidney the kidneys are normal in size and demonstrate normal cortical enhancement. Multiple scattered simple cysts are seen within the kidneys bilaterally. No enhancing intrarenal masses. No hydronephrosis. No intrarenal or ureteral calculi. The bladder is decompressed. Stomach/Bowel: Stomach is within normal limits. Appendix appears normal. No evidence of bowel wall thickening, distention, or inflammatory changes. No free intraperitoneal gas or fluid. Lymphatic: No pathologic adenopathy within the abdomen and pelvis. Reproductive: Mild prostatic enlargement with indentation of the bladder base. Other: Bilateral inguinal hernia repair with mesh has been performed. Tiny fat containing umbilical hernia. Rectum unremarkable. Musculoskeletal: No acute bone abnormality. No lytic or blastic bone lesion. Degenerative changes are seen within the lumbar spine. IMPRESSION: VASCULAR No significant vascular abnormality identified within the abdomen and pelvis. Mild atherosclerotic calcification. NON-VASCULAR No acute intra-abdominal pathology identified. No definite radiographic explanation for the patient's reported symptoms. Electronically Signed   By: Fidela Salisbury M.D.   On: 03/10/2022 02:29   ?PROCEDURES: ?Critical Care performed: No ?Procedures ?MEDICATIONS ORDERED IN ED: ?Medications - No data to display ?IMPRESSION / MDM / ASSESSMENT AND PLAN / ED COURSE  ?I reviewed the triage vital signs and the nursing notes. ?             ?               ?The patient is on the cardiac monitor to evaluate for evidence of arrhythmia and/or significant heart rate changes. ?Patient presents for abdominal pain.  Differential diagnosis includes appendicitis, abdominal aortic aneurysm, surgical biliary disease, pancreatitis, SBO, mesenteric ischemia, serious intra-abdominal bacterial illness, genital torsion. ?Doubt atypical ACS. ?Based  on history, physical exam, radiologic/laboratory evaluation, there is no red flag results or symptomatology requiring emergent intervention or need for admission at this time ?Pt tolerating PO. ?Disposition: Patient will be discharged with strict return precautions and follow up with primary MD within 12-24 hours for further evaluation. ?Patient understands that this still may have an early presentation of an emergent medical condition such as appendicitis that will require a recheck. ? ?  ?FINAL CLINICAL IMPRESSION(S) / ED DIAGNOSES  ? ?Final diagnoses:  ?Right lower quadrant abdominal pain  ? ?Rx / DC Orders  ? ?ED Discharge Orders   ? ? None  ? ?  ? ?Note:  This document was prepared using Dragon voice recognition software and may include unintentional dictation errors. ?  ?Naaman Plummer, MD ?03/10/22 [phone] ? ?

## 2022-03-10 NOTE — Discharge Instructions (Signed)
Please use simethicone (Gas-X) as needed for continued pain and that abdominal region ?Please use ibuprofen (Motrin) up to 800 mg every 8 hours, naproxen (Naprosyn) up to 500 mg every 12 hours, and/or acetaminophen (Tylenol) up to 4 g/day for any continued pain ?

## 2022-03-10 NOTE — ED Triage Notes (Addendum)
Pt arrived via POV with continued abd pain, just discharged from ED a few hours ago, pt states he went home and tried to sleep and states the pain came back and he was unable to sleep.  ? ?Pt had labs and CTA of abdomen done  ?

## 2022-04-13 ENCOUNTER — Other Ambulatory Visit: Payer: Self-pay | Admitting: Internal Medicine

## 2022-06-07 DIAGNOSIS — L309 Dermatitis, unspecified: Principal | ICD-10-CM

## 2022-06-07 DIAGNOSIS — L2084 Intrinsic (allergic) eczema: Principal | ICD-10-CM

## 2022-06-07 MED ORDER — DUPILUMAB 300 MG/2 ML SUBCUTANEOUS PEN INJECTOR
SUBCUTANEOUS | 11 refills | 0.00000 days | Status: CP
Start: 2022-06-07 — End: ?

## 2022-06-07 MED ORDER — TRIAMCINOLONE ACETONIDE 0.1 % TOPICAL OINTMENT
6 refills | 0 days | Status: CP
Start: 2022-06-07 — End: ?

## 2022-06-07 MED ORDER — CLOBETASOL 0.05 % TOPICAL OINTMENT
5 refills | 0 days | Status: CP
Start: 2022-06-07 — End: ?

## 2022-06-07 NOTE — Unmapped (Signed)
TC to patient to clarify when dupixent last taken.  Last injection on 02/16/22.  Patient reports there was then confusion with the pharmacy about his insurance coverage and he has not been able to fill until now.  Patient is symptomatic with itching and no longer has topical medications in home.

## 2022-06-07 NOTE — Unmapped (Signed)
TC to patient to relay plan per Dr. Linton Rump.  Reviewed dupixent loading and maintenance doses and notified him of topicals sent to his local pharmacy as well.  Sent myChart with written dupixent instructions as well per his request.  Patient verbalizes agreement and understanding of plan.

## 2022-06-07 NOTE — Unmapped (Signed)
[facility] LVM on the nurse line.  They stated that they received a refill request for Dupixent, but according to their records the patient's last dose was in February.  They were calling to see if the patient needs a loading dose, or if it's okay to dispense the maintenance dose?

## 2022-06-10 ENCOUNTER — Other Ambulatory Visit: Payer: Self-pay | Admitting: Internal Medicine

## 2022-06-11 ENCOUNTER — Other Ambulatory Visit: Payer: Self-pay | Admitting: Internal Medicine

## 2022-07-12 ENCOUNTER — Other Ambulatory Visit: Payer: Self-pay | Admitting: Internal Medicine

## 2022-08-09 ENCOUNTER — Encounter: Payer: Self-pay | Admitting: Internal Medicine

## 2022-08-09 ENCOUNTER — Ambulatory Visit (INDEPENDENT_AMBULATORY_CARE_PROVIDER_SITE_OTHER): Payer: Medicare HMO | Admitting: Internal Medicine

## 2022-08-09 ENCOUNTER — Telehealth: Payer: Self-pay

## 2022-08-09 VITALS — BP 102/68 | HR 67 | Ht 70.0 in | Wt 180.0 lb

## 2022-08-09 DIAGNOSIS — E785 Hyperlipidemia, unspecified: Secondary | ICD-10-CM

## 2022-08-09 DIAGNOSIS — I5022 Chronic systolic (congestive) heart failure: Secondary | ICD-10-CM | POA: Diagnosis not present

## 2022-08-09 DIAGNOSIS — I428 Other cardiomyopathies: Secondary | ICD-10-CM

## 2022-08-09 DIAGNOSIS — I2510 Atherosclerotic heart disease of native coronary artery without angina pectoris: Secondary | ICD-10-CM

## 2022-08-09 NOTE — Telephone Encounter (Signed)
  Patient Consent for Virtual Visit      Earl Hays has provided verbal consent on 08/09/2022 for a virtual visit (video or telephone).   CONSENT FOR VIRTUAL VISIT FOR:  Earl Hays  By participating in this virtual visit I agree to the following:  I hereby voluntarily request, consent and authorize [facility] and its employed or contracted physicians, physician assistants, nurse practitioners or other licensed health care professionals (the Practitioner), to provide me with telemedicine health care services (the "Services") as deemed necessary by the treating Practitioner. I acknowledge and consent to receive the Services by the Practitioner via telemedicine. I understand that the telemedicine visit will involve communicating with the Practitioner through live audiovisual communication technology and the disclosure of certain medical information by electronic transmission. I acknowledge that I have been given the opportunity to request an in-person assessment or other available alternative prior to the telemedicine visit and am voluntarily participating in the telemedicine visit.  I understand that I have the right to withhold or withdraw my consent to the use of telemedicine in the course of my care at any time, without affecting my right to future care or treatment, and that the Practitioner or I may terminate the telemedicine visit at any time. I understand that I have the right to inspect all information obtained and/or recorded in the course of the telemedicine visit and may receive copies of available information for a reasonable fee.  I understand that some of the potential risks of receiving the Services via telemedicine include:  Delay or interruption in medical evaluation due to technological equipment failure or disruption; Information transmitted may not be sufficient (e.g. poor resolution of images) to allow for appropriate medical decision making by the Practitioner; and/or   In rare instances, security protocols could fail, causing a breach of personal health information.  Furthermore, I acknowledge that it is my responsibility to provide information about my medical history, conditions and care that is complete and accurate to the best of my ability. I acknowledge that Practitioner's advice, recommendations, and/or decision may be based on factors not within their control, such as incomplete or inaccurate data provided by me or distortions of diagnostic images or specimens that may result from electronic transmissions. I understand that the practice of medicine is not an exact science and that Practitioner makes no warranties or guarantees regarding treatment outcomes. I acknowledge that a copy of this consent can be made available to me via my patient portal ([facility]), or I can request a printed copy by calling the office of [facility].    I understand that my insurance will be billed for this visit.   I have read or had this consent read to me. I understand the contents of this consent, which adequately explains the benefits and risks of the Services being provided via telemedicine.  I have been provided ample opportunity to ask questions regarding this consent and the Services and have had my questions answered to my satisfaction. I give my informed consent for the services to be provided through the use of telemedicine in my medical care

## 2022-08-09 NOTE — Patient Instructions (Signed)

## 2022-08-09 NOTE — Progress Notes (Signed)
Virtual Visit via Telephone Note   Because of Earl Hays's co-morbid illnesses, he is at least at moderate risk for complications without adequate follow up.  This format is felt to be most appropriate for this patient at this time.  The patient did not have access to video technology/had technical difficulties with video requiring transitioning to audio format only (telephone).  All issues noted in this document were discussed and addressed.  No physical exam could be performed with this format.  Please refer to the patient's chart for his consent to telehealth for [facility].    Date:  08/09/2022   ID:  Earl Hays, DOB 1941-02-27, MRN [id number] The patient was identified using 2 identifiers.  Patient Location: Home Provider Location: Home Office   PCP:  Rusty Aus, MD   [place] Providers Cardiologist:  Nelva Bush, MD     Evaluation Performed:  Follow-Up Visit  Chief Complaint: Follow-up nonischemic cardiomyopathy  History of Present Illness:    Earl Hays is a 81 y.o. male with history of chronic HFrEF due to nonischemic cardiomyopathy (LVEF improved to 45-50% on most recent echo in 01/2020), nonobstructive CAD, esophageal varices with upper GI bleed, recurrent epistaxis, and eczema.  We are speaking today for follow-up of his HFrEF due to nonischemic cardiomyopathy and nonobstructive coronary artery disease.  I last saw him in February, at which time he was doing fairly well other than recent neck/jaw pain felt to be musculoskeletal.  We did not make any medication changes or pursue further testing.  Today, Earl Hays reports that he has been feeling well from a heart standpoint, denies chest pain, shortness of breath, palpitations, lightheadedness, and edema.  He had been without his eczema shots for a while and experienced a flare.  He is now back on them but has not experienced complete resolution.  He is not walking as often, as he now works  part-time again.   Past Medical History:  Diagnosis Date   Anemia    CHF (congestive heart failure) (HCC)    Colon adenoma    Coronary artery disease    a. 12/2017 Cath: LM min irregs, LAD 40ost, 45m RI 60, LCX 279mRCA large, nl.   Eczema    GERD (gastroesophageal reflux disease)    [place] (heart [place] with [place]) [place]   a. 12/2017 Echo: EF 30-35%, gr2 DD. Mild to mod MR. Mod dil LA. Nl RV fxn. Mildly dil RA. PASP 4589m; b. 04/2018 Echo: EF 30-35%, diff HK. Nl RV fxn.   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 12/2017 Echo: EF 30-35%, gr2 DD; b. 04/2018 Echo: EF 30-35%.   Varices, esophageal ([place]  Past Surgical History:  Procedure Laterality Date   CARDIAC CATHETERIZATION     CATARACT EXTRACTION W/PHACO Left 01/21/2017   Procedure: CATARACT EXTRACTION PHACO AND INTRAOCULAR LENS PLACEMENT (IOC);  Surgeon: WilBirder RobsonD;  Location: [facility] ORS;  Service: Ophthalmology;  Laterality: Left;  [place]:48 AP% 21.6 CDE 10.39 Fluid pack lot # [id number]  CATARACT EXTRACTION W/PHACO Right 02/11/2017   Procedure: CATARACT EXTRACTION PHACO AND INTRAOCULAR LENS PLACEMENT (IOC);  Surgeon: WilBirder RobsonD;  Location: [facility] ORS;  Service: Ophthalmology;  Laterality: Right;  [place]:42 AP% 22.4 CDE 9.47 Fluid pack lot # [id number]  COLONOSCOPY     COLONOSCOPY  WITH PROPOFOL N/A 02/09/2020   Procedure: COLONOSCOPY WITH PROPOFOL;  Surgeon: Toledo, Benay Pike, MD;  Location: [facility] ENDOSCOPY;  Service: Gastroenterology;  Laterality: N/A;   ESOPHAGOGASTRODUODENOSCOPY     ESOPHAGOGASTRODUODENOSCOPY (EGD) WITH PROPOFOL N/A 02/09/2020   Procedure: ESOPHAGOGASTRODUODENOSCOPY (EGD) WITH PROPOFOL;  Surgeon: Toledo, Benay Pike, MD;  Location: [facility] END[facility];  Service: Gastroenterology;  Laterality: N/A;   HERNIA REPAIR     JOINT REPLACEMENT     TKR   PROSTATE SURGERY     REPLACEMENT TOTAL KNEE  BILATERAL Bilateral    RIGHT/LEFT HEART CATH AND CORONARY ANGIOGRAPHY N/A 12/31/2017   Procedure: RIGHT/LEFT HEART CATH AND CORONARY ANGIOGRAPHY;  Surgeon: Nelva Bush, MD;  Location: [place] CV LAB;  Service: Cardiovascular;  Laterality: N/A;     Current Meds  Medication Sig   aspirin EC 81 MG tablet Take 81 mg by mouth daily.   atorvastatin (LIPITOR) 40 MG tablet TAKE 1 TABLET DAILY   carvedilol (COREG) 6.25 MG tablet TAKE 1 TABLET TWICE A DAY   Cyanocobalamin 3000 MCG SUBL Place 3,000 mcg under the tongue once a week.    Dupilumab, Asthma, (DUPIXENT [place]) Inject 300 mg into the skin every 14 (fourteen) days.   famotidine (PEPCID) 40 MG tablet Take 40 mg by mouth daily.   folic acid (FOLVITE) 1 MG tablet Take 1 mg by mouth daily.   furosemide (LASIX) 20 MG tablet TAKE 1 TABLET DAILY   pantoprazole (PROTONIX) 40 MG tablet Take 40 mg by mouth 2 (two) times daily.    sacubitril-valsartan (ENTRESTO) 24-26 MG Take 1 tablet by mouth 2 (two) times daily.   spironolactone (ALDACTONE) 25 MG tablet TAKE 1 TABLET DAILY   tadalafil (CIALIS) 10 MG tablet Take 10 mg by mouth daily as needed for erectile dysfunction.     Allergies:   Lamisil [terbinafine]   Social History   Tobacco Use   Smoking status: Former    Packs/day: 0.25    Years: 44.00    Total pack years: 11.00    Types: Cigarettes    Quit date: 2004    Years since quitting: 19.6   Smokeless tobacco: Never   Tobacco comments:    03/23/04  Vaping Use   Vaping Use: Never used  Substance Use Topics   Alcohol use: Yes    Comment: occassional beer   Drug use: No     Family Hx: The patient's family history includes Brain cancer in his mother.  ROS:   Please see the history of present illness.   All other systems reviewed and are negative.   Prior CV studies:   The following studies were reviewed today:  TTE (01/28/2020):  1. Left ventricular ejection fraction, by visual estimation, is 45 to  50%. The left ventricle  has mildly decreased function. There is no left  ventricular hypertrophy.   2. Left ventricular diastolic parameters are consistent with Grade I  diastolic dysfunction (impaired relaxation).   3. The left ventricle demonstrates global hypokinesis.   4. Global right ventricle has normal systolic function.The right  ventricular size is normal. No increase in right ventricular wall  thickness.   5. Left atrial size was normal.   6. Normal pulmonary artery systolic pressure.  Labs/Other Tests and Data Reviewed:    EKG:  No ECG reviewed.  Recent Labs: 03/09/2022: ALT 19; BUN 19; Creatinine, Ser 1.32; Hemoglobin 14.0; Platelets 159; Potassium 3.9; Sodium 137   Recent Lipid Panel Lab Results  Component Value Date/Time   CHOL 139 10/30/2021  10:22 AM   CHOL 153 10/07/2018 02:52 PM   TRIG 58 10/30/2021 10:22 AM   HDL 47 10/30/2021 10:22 AM   HDL 68 10/07/2018 02:52 PM   CHOLHDL 3.0 10/30/2021 10:22 AM   LDLCALC 80 10/30/2021 10:22 AM   LDLCALC 50 10/07/2018 02:52 PM    Wt Readings from Last 3 Encounters:  08/09/22 180 lb (81.6 kg)  03/10/22 170 lb (77.1 kg)  03/09/22 170 lb (77.1 kg)     Risk Assessment/Calculations:          Objective:    Vital Signs:  BP 102/68   Pulse 67   Ht '5\' 10"'$  (1.778 m)   Wt 180 lb (81.6 kg)   BMI 25.83 kg/m    VITAL SIGNS:  reviewed  ASSESSMENT & PLAN:    Chronic HFrEF due to nonischemic cardiomyopathy: Mr. Sivils is asymptomatic with NYHA class I symptoms.  Most recent LVEF in 2021 was 45-50%.  He is tolerating his current GDMT consisting of carvedilol, Entresto, and spironolactone well.  Labs last month through PCPs office showed stable renal function and electrolytes.  Given class I symptoms and near normal LVEF, we will defer challenging him with SGLT2 inhibitor.  Coronary artery disease: Prior cath showed nonobstructive CAD.  No angina reported.  Continue aspirin and statin therapy to prevent progression of disease.  Hyperlipidemia: LDL  reasonable on last check through Dr. Ammie Ferrier office on 07/22/2022 at 44.  Continue atorvastatin 40 mg daily.  Time:   Today, I have spent 7 minutes with the patient with telehealth technology discussing the above problems.     Medication Adjustments/Labs and Tests Ordered: Current medicines are reviewed at length with the patient today.  Concerns regarding medicines are outlined above.   Tests Ordered: None  Medication Changes: None  Follow Up:  In Person in 6 month(s)  Signed, Nelva Bush, MD  08/09/2022 2:04 PM    [place]

## 2022-09-19 ENCOUNTER — Ambulatory Visit: Payer: Medicare HMO | Admitting: Internal Medicine

## 2022-10-16 ENCOUNTER — Other Ambulatory Visit: Payer: Self-pay | Admitting: Internal Medicine

## 2022-10-17 ENCOUNTER — Telehealth: Payer: Self-pay | Admitting: Internal Medicine

## 2022-10-17 NOTE — Telephone Encounter (Signed)
*  STAT* If patient is at the pharmacy, call can be transferred to refill team.   1. Which medications need to be refilled? (please list name of each medication and dose if known) Furosemide '20mg'$  1tablet daily  2. Which pharmacy/location (including street and city if local pharmacy) is medication to be sent to? [facility]  3. Do they need a 30 day or 90 day supply? 90 days

## 2022-10-17 NOTE — Telephone Encounter (Signed)
Notified patient Furosemide 20 mg tablet was sent for a 90 day supply to [facility].

## 2022-10-26 ENCOUNTER — Other Ambulatory Visit: Payer: Self-pay | Admitting: Internal Medicine

## 2022-10-31 ENCOUNTER — Other Ambulatory Visit: Payer: Self-pay

## 2022-10-31 MED ORDER — ENTRESTO 24-26 MG PO TABS: 1.0000 | ORAL_TABLET | Freq: Two times a day (BID) | ORAL | 0 refills | Status: DC

## 2022-11-06 ENCOUNTER — Telehealth: Payer: Self-pay | Admitting: Internal Medicine

## 2022-11-06 NOTE — Telephone Encounter (Signed)
Patient dropped PAF placed in box 

## 2022-11-07 NOTE — Telephone Encounter (Signed)
Received [employer] PAF.  Called pt made aware he will need to provide proof of income. Pt voiced he will drop statement off tomorrow.

## 2022-11-12 NOTE — Telephone Encounter (Signed)
Entresto PA faxed to [employer] for review.

## 2022-11-20 NOTE — Telephone Encounter (Signed)
Called pt and informed [employer] need copies of updated insurance card. Pt verbalized understanding and will bring a copy to office today.

## 2022-11-22 NOTE — Telephone Encounter (Signed)
Insurance card and updated PAF faxed to [employer].

## 2022-11-28 NOTE — Telephone Encounter (Signed)
Pt made aware [employer] approved [employer] patient assistance until 12/23/23. Pt verbalized understanding.

## 2023-01-01 DIAGNOSIS — L2084 Intrinsic (allergic) eczema: Principal | ICD-10-CM

## 2023-01-01 MED ORDER — DUPILUMAB 300 MG/2 ML SUBCUTANEOUS PEN INJECTOR
11 refills | 0.00000 days
Start: 2023-01-01 — End: ?

## 2023-01-01 NOTE — Unmapped (Signed)
PLV on nurseline requesting refill for Dupixent.

## 2023-01-02 NOTE — Unmapped (Signed)
Error

## 2023-01-15 MED ORDER — DUPILUMAB 300 MG/2 ML SUBCUTANEOUS PEN INJECTOR
SUBCUTANEOUS | 6 refills | 0.00000 days | Status: CP
Start: 2023-01-15 — End: ?

## 2023-01-15 NOTE — Unmapped (Signed)
Patient came in to ask about his shot he is supposed to receive on Saturday. The pharmacy told him they haven't received the authorization and needs [place] to call.  He provided the number [phone] to call

## 2023-01-15 NOTE — Unmapped (Signed)
Call from [facility] with Dup[employer]ent [employer].  Requests call back to discuss patient's Dupixent.  To call [phone].

## 2023-01-17 NOTE — Unmapped (Signed)
Call from [employer] requesting status of PA for Dupixent. To call them at [phone].

## 2023-01-20 NOTE — Unmapped (Signed)
Nicholous Voorhees Key: [id number] - PA Case ID: [id number]    STATUS:  Outcome   Approvedtoday  Your request has been approved  Drug    Dupixent 300MG /2ML pen-injectors Form    [employer] Electronic PA Form [phone] NCPDP)

## 2023-01-20 NOTE — Unmapped (Signed)
PA initiated for Dupixent via CMM.  Key: [id number]

## 2023-01-21 NOTE — Unmapped (Signed)
Patient walked in to front desk during clinic today stating that he was waiting for his Dupixent to be Prior Authorized, and he has not received his medication. Patient was informed that according to his chart, his Dupixent was approved yesterday. Print out of screenshot for approval from Patient's chart was given to patient as well. Patient was informed that since the medication was just approved yesterday, he needs to give his mail order about 24 hours to receive the information, or he can call his mail order service to let them know it has been approved. Patient verbalized understanding of our conversation prior to leaving the office. Will forward this message to the Prior Authorization department to see if they can assist with making sure Patient's mail order receives the approval notice in a timely matter.

## 2023-01-23 ENCOUNTER — Other Ambulatory Visit: Payer: Self-pay | Admitting: Internal Medicine

## 2023-02-05 ENCOUNTER — Ambulatory Visit: Payer: Medicare HMO | Attending: Internal Medicine | Admitting: Internal Medicine

## 2023-02-05 ENCOUNTER — Encounter: Payer: Self-pay | Admitting: Internal Medicine

## 2023-02-05 VITALS — BP 108/58 | HR 88 | Ht 70.0 in | Wt 180.0 lb

## 2023-02-05 DIAGNOSIS — R051 Acute cough: Secondary | ICD-10-CM

## 2023-02-05 DIAGNOSIS — E782 Mixed hyperlipidemia: Secondary | ICD-10-CM | POA: Diagnosis not present

## 2023-02-05 DIAGNOSIS — I2510 Atherosclerotic heart disease of native coronary artery without angina pectoris: Secondary | ICD-10-CM | POA: Diagnosis not present

## 2023-02-05 DIAGNOSIS — I5022 Chronic systolic (congestive) heart failure: Secondary | ICD-10-CM

## 2023-02-05 DIAGNOSIS — I428 Other cardiomyopathies: Secondary | ICD-10-CM | POA: Diagnosis not present

## 2023-02-05 NOTE — Patient Instructions (Signed)
Medication Instructions:  Your Physician recommend you continue on your current medication as directed.    *If you need a refill on your cardiac medications before your next appointment, please call your pharmacy*   Lab Work: None ordered today   Testing/Procedures: None ordered today   Follow-Up: At [facility], you and your health needs are our priority.  As part of our continuing mission to provide you with exceptional heart care, we have created designated Provider Care Teams.  These Care Teams include your primary Cardiologist (physician) and Advanced Practice Providers (APPs -  Physician Assistants and Nurse Practitioners) who all work together to provide you with the care you need, when you need it.  We recommend signing up for the patient portal called "MyChart".  Sign up information is provided on this After Visit Summary.  MyChart is used to connect with patients for Virtual Visits (Telemedicine).  Patients are able to view lab/test results, encounter notes, upcoming appointments, etc.  Non-urgent messages can be sent to your provider as well.   To learn more about what you can do with MyChart, go to [email or web address].    Your next appointment:   6 month(s)  Provider:   You may see Nelva Bush, MD or one of the following Advanced Practice Providers on your designated Care Team:   Murray Hodgkins, NP Christell Faith, PA-C Cadence Kathlen Mody, PA-C Gerrie Nordmann, NP    Please let us know if your cough doesn't get resolve.

## 2023-02-05 NOTE — Progress Notes (Unsigned)
Follow-up Outpatient Visit Date: 02/05/2023  Primary Care Provider: Rusty Aus, MD [place] [address]  Chief Complaint: Cough  HPI:  [name] is a 82 y.o. male with history of chronic HFrEF due to nonischemic cardiomyopathy (LVEF improved to 45-50% on most recent echo in 01/2020), nonobstructive CAD, esophageal varices with upper GI bleed, recurrent epistaxis, and eczema, who presents for follow-up of nonischemic cardiomyopathy.  We last spoke via virtual visit in 07/2022, at which time [name] was feeling well from a heart standpoint.  He had been without his eczema medication for a while and was experiencing a flare.  We did not make any medication changes or pursue additional testing.  Today, [name] reports that he has been doing well other than a dry cough that started about 2 to 3 weeks ago.  He is now on his second course of prednisone and also receiving azithromycin after seeing Dr. Sabra Heck 2 days ago.  He has not noticed any improvement yet.  Cough is most pronounced when he first lies down at night.  He endorses some rhinorrhea but has not had any other URI symptoms.  He denies chest pain, shortness of breath, palpitations, lightheadedness, edema, fevers, and chills.  He has been fairly sedentary but is hoping to go back to the [facility] soon.  He is receiving treatments for eczema [facility] at [facility].  --------------------------------------------------------------------------------------------------  Past Medical History:  Diagnosis Date   Anemia    CHF (congestive heart failure) ([place])    Colon adenoma    Coronary artery disease    a. 12/2017 Cath: LM min irregs, LAD 40ost, 75m RI 60, LCX 217mRCA large, nl.   Eczema    GERD (gastroesophageal reflux disease)    HFrEF (heart failure with reduced ejection fraction) ([facility]   a. 12/2017 Echo: EF 30-35%, gr2 DD. Mild to mod MR. Mod dil LA. Nl RV fxn. Mildly dil RA. PASP 4516m; b.  04/2018 Echo: EF 30-35%, diff HK. Nl RV fxn.   Hyperlipidemia    Hypertension    Mallory-Weiss tear    with upper gi bleed 12/2013   [place]IC[place] ([place]ischemic cardiomyopathy) ([place]  a. 12/2017 Echo: EF 30-35%, gr2 DD; b. 04/2018 Echo: EF 30-35%.   Varices, esophageal ([place]  Past Surgical History:  Procedure Laterality Date   CARDIAC CATHETERIZATION     CATARACT EXTRACTION W/PHACO Left 01/21/2017   Procedure: CATARACT EXTRACTION PHACO AND INTRAOCULAR LENS PLACEMENT (IOC);  Surgeon: WilBirder RobsonD;  Location: [facility] ORS;  Service: Ophthalmology;  Laterality: Left;  [place]:48 AP% 21.6 CDE 10.39 Fluid pack lot # [id number]  CATARACT EXTRACTION W/PHACO Right 02/11/2017   Procedure: CATARACT EXTRACTION PHACO AND INTRAOCULAR LENS PLACEMENT (IOC);  Surgeon: WilBirder RobsonD;  Location: [facility] ORS;  Service: Ophthalmology;  Laterality: Right;  [place]:42 AP% 22.4 CDE 9.47 Fluid pack lot # [id number]  COLONOSCOPY     COLONOSCOPY WITH PROPOFOL N/A 02/09/2020   Procedure: COLONOSCOPY WITH PROPOFOL;  Surgeon: Toledo, TeoBenay PikeD;  Location: [facility] END[facility];  Service: Gastroenterology;  Laterality: N/A;   ESOPHAGOGASTRODUODENOSCOPY     ESOPHAGOGASTRODUODENOSCOPY (EGD) WITH PROPOFOL N/A 02/09/2020   Procedure: ESOPHAGOGASTRODUODENOSCOPY (EGD) WITH PROPOFOL;  Surgeon: Toledo, TeoBenay PikeD;  Location: [facility] END[facility];  Service: Gastroenterology;  Laterality: N/A;   HERNIA REPAIR     JOINT REPLACEMENT     TKR   PROSTATE SURGERY     REPLACEMENT TOTAL KNEE BILATERAL  Bilateral    RIGHT/LEFT HEART CATH AND CORONARY ANGIOGRAPHY N/A 12/31/2017   Procedure: RIGHT/LEFT HEART CATH AND CORONARY ANGIOGRAPHY;  Surgeon: Nelva Bush, MD;  Location: [place] CV LAB;  Service: Cardiovascular;  Laterality: N/A;    Current Meds  Medication Sig   aspirin EC 81 MG tablet Take 81 mg by mouth daily.   atorvastatin (LIPITOR) 40 MG tablet TAKE 1 TABLET DAILY   azithromycin (ZITHROMAX) 250 MG tablet Take 250 mg by mouth  as directed.   carvedilol (COREG) 6.25 MG tablet TAKE 1 TABLET TWICE A DAY   Cyanocobalamin 3000 MCG SUBL Place 3,000 mcg under the tongue once a week.    Dupilumab, Asthma, (DUPIXENT [place]) Inject 300 mg into the skin every 14 (fourteen) days.   famotidine (PEPCID) 40 MG tablet Take 40 mg by mouth daily.   folic acid (FOLVITE) 1 MG tablet Take 1 mg by mouth daily.   furosemide (LASIX) 20 MG tablet TAKE 1 TABLET DAILY   HYDROcodone bit-homatropine (HYCODAN) 5-1.5 MG/5ML syrup Take 5 mLs by mouth every 6 (six) hours as needed.   pantoprazole (PROTONIX) 40 MG tablet Take 40 mg by mouth 2 (two) times daily.    predniSONE (DELTASONE) 20 MG tablet Take 20 mg by mouth daily.   sacubitril-valsartan (ENTRESTO) 24-26 MG Take 1 tablet by mouth 2 (two) times daily.   spironolactone (ALDACTONE) 25 MG tablet TAKE 1 TABLET DAILY   tadalafil (CIALIS) 10 MG tablet Take 10 mg by mouth daily as needed for erectile dysfunction.    Allergies: Lamisil [terbinafine]  Social History   Tobacco Use   Smoking status: Former    Packs/day: 0.25    Years: 44.00    Total pack years: 11.00    Types: Cigarettes    Quit date: 2004    Years since quitting: 20.1   Smokeless tobacco: Never   Tobacco comments:    03/23/04  Vaping Use   Vaping Use: Never used  Substance Use Topics   Alcohol use: Yes    Comment: occassional beer   Drug use: No    Family History  Problem Relation Age of Onset   Brain cancer Mother     Review of Systems: A 12-system review of systems was performed and was negative except as noted in the HPI.  --------------------------------------------------------------------------------------------------  Physical Exam: BP (!) 108/58 (BP Location: Left Arm, Patient Position: Sitting, Cuff Size: Normal)   Pulse 88   Ht 5' 10"$  (1.778 m)   Wt 180 lb (81.6 kg)   SpO2 99%   BMI 25.83 kg/m   General:  NAD. Neck: No JVD or HJR. Lungs: Clear to auscultation bilaterally without wheezes or  crackles. Heart: Regular rate and rhythm without murmurs, rubs, or gallops. Abdomen: Soft, nontender, nondistended. Extremities: No lower extremity edema.  EKG: Normal sinus rhythm with isolated PVC.  Compared to prior tracing from 01/25/2022, PVCs have replaced PACs.  Lab Results  Component Value Date   WBC 7.7 03/09/2022   HGB 14.0 03/09/2022   HCT 42.9 03/09/2022   MCV 90.1 03/09/2022   PLT 159 03/09/2022    Lab Results  Component Value Date   NA 137 03/09/2022   K 3.9 03/09/2022   CL 100 03/09/2022   CO2 30 03/09/2022   BUN 19 03/09/2022   CREATININE 1.32 (H) 03/09/2022   GLUCOSE 141 (H) 03/09/2022   ALT 19 03/09/2022    Lab Results  Component Value Date   CHOL 139 10/30/2021   HDL 47 10/30/2021  [place] 80 10/30/2021   TRIG 58 10/30/2021   CHOLHDL 3.0 10/30/2021    --------------------------------------------------------------------------------------------------  ASSESSMENT AND PLAN: Chronic HFrEF due to nonischemic cardiomyopathy: Mr. Hoag continues to do well with NYHA class I symptoms.  LVEF on last assessment in 2021 was low normal.  He is tolerating his current regimen of GDMT consisting of carvedilol, Entresto, and spironolactone well.  Given borderline low blood pressure again today, we will defer escalation.  If he continues to have cough, we may need to consider repeating an echocardiogram to ensure that it is not a sign of worsening cardiomyopathy, though he otherwise does not have any signs or symptoms to suggest this.  We may also need to consider holding Entresto.  Coronary artery disease: Prior catheterization in 2019 showed nonobstructive coronary artery disease.  Continue aspirin and atorvastatin to prevent progression of disease.  Hyperlipidemia: LDL just above goal at 75 on last check earlier this month through Dr. Ammie Ferrier office.  Continue atorvastatin 40 mg daily.  I encouraged Mr. Polynice to resume exercise.  Cough: Continue prednisone and  azithromycin, as directed by Dr. Sabra Heck.  If cough persists, we may need to consider holding Entresto to see if symptoms improve +/- repeat an echocardiogram.  I asked him to reach out to [place] in a couple weeks if symptoms persist.  Chest radiograph done 2 days ago through [place] clinic (results not available for review).  Follow-up: Return to clinic in 6 months.  Nelva Bush, MD 02/05/2023 4:13 PM

## 2023-02-06 ENCOUNTER — Encounter: Payer: Self-pay | Admitting: Internal Medicine

## 2023-02-14 NOTE — Unmapped (Signed)
PA approval letter for Dupixent uploaded to media tab.

## 2023-02-26 ENCOUNTER — Ambulatory Visit: Payer: Medicare HMO | Admitting: Urology

## 2023-02-26 ENCOUNTER — Encounter: Payer: Self-pay | Admitting: Urology

## 2023-02-26 VITALS — BP 113/71 | HR 88 | Ht 70.0 in | Wt 176.2 lb

## 2023-02-26 DIAGNOSIS — N529 Male erectile dysfunction, unspecified: Secondary | ICD-10-CM

## 2023-02-26 MED ORDER — SILDENAFIL CITRATE 100 MG PO TABS: 100.0000 mg | ORAL_TABLET | Freq: Every day | ORAL | 11 refills | Status: DC | PRN

## 2023-02-26 NOTE — Progress Notes (Signed)
I, Jeanmarie Hubert Maxie,acting as a scribe for Hollice Espy, MD.,have documented all relevant documentation on the behalf of Hollice Espy, MD,as directed by  Hollice Espy, MD while in the presence of Hollice Espy, MD.   02/26/23 4:48 PM   [name] [birth year] [id number]  Referring provider: Rusty Aus, MD [facility] [place],  [place] [address]  Chief Complaint  Patient presents with   Erectile Dysfunction    HPI: 82 year-old male who presents today to discuss erectile dysfunction. He was prescribed Tadalafil 10 mg several year ago.   He has a personal extensive cardiac history followed by Dr. 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  He reports a long-standing issue with erectile dysfunction, for which he was previously prescribed tadalafil 10 mg daily, with no significant improvement. He was given this prescription last year during a physical examination. He denies any improvement with tadalafil and reports he has not tried any other medications for erectile dysfunction, such as Viagra.    SHIM     Row Name 02/26/23 1546         SHIM: Over the last 6 months:   How do you rate your confidence that you could get and keep an erection? Very Low     When you had erections with sexual stimulation, how often were your erections hard enough for penetration (entering your partner)? Almost Never or Never     During sexual intercourse, how often were you able to maintain your erection after you had penetrated (entered) your partner? Almost Never or Never     During sexual intercourse, how difficult was it to maintain your erection to completion of intercourse? Extremely Difficult     When you attempted sexual intercourse, how often was it satisfactory for you? Almost Never or Never       SHIM Total  Score   SHIM 5              Score: 1-7 Severe ED 8-11 Moderate ED 12-16 Mild-Moderate ED 17-21 Mild ED 22-25 No ED    PMH: Past Medical History:  Diagnosis Date   Anemia    CHF (congestive heart failure) (HCC)    Colon adenoma    Coronary artery disease    a. 12/2017 Cath: LM min irregs, LAD 40ost, 47m RI 60, LCX 21mRCA large, nl.   Eczema    GERD (gastroesophageal reflux disease)    [facility]EF (heart failure with reduced ejection fraction) ([facility]   a. 12/2017 Echo: EF 30-35%, gr2 DD. Mild to mod MR. Mod dil LA. Nl RV fxn. Mildly dil RA. PASP 4532m; b. 04/2018 Echo: EF 30-35%, diff HK. Nl RV fxn.   Hyperlipidemia    Hypertension    Mallory-Weiss tear    with upper gi bleed 12/2013   [facility]mic [facility]  a. 12/2017 Echo: EF 30-35%, gr2 DD; b. 04/2018 Echo: EF 30-35%.   [facility]   Surgical History: Past Surgical History:  Procedure Laterality Date   CARDIAC CATHETERIZATION     CATARACT EXTRACTION W/PHACO Left 01/21/2017   Procedure: CATARACT EXTRACTION PHACO AND INTRAOCULAR LENS PLACEMENT (IOC);  Surgeon: WilBirder RobsonD;  Location: [facility] ORS;  Service: Ophthalmology;  Laterality: Left;  [place]:48 AP% 21.6 CDE 10.39 Fluid pack lot # [id number]  CATARACT EXTRACTION W/PHACO  Right 02/11/2017   Procedure: CATARACT EXTRACTION PHACO AND INTRAOCULAR LENS PLACEMENT (IOC);  Surgeon: Birder Robson, MD;  Location: [facility] ORS;  Service: Ophthalmology;  Laterality: Right;  [place] 00:42 AP% 22.4 CDE 9.47 Fluid pack lot # [id number]   COLONOSCOPY     COLONOSCOPY WITH PROPOFOL N/A 02/09/2020   Procedure: COLONOSCOPY WITH PROPOFOL;  Surgeon: Toledo, Benay Pike, MD;  Location: [facility] ENDOSCOPY;  Service: Gastroenterology;  Laterality: N/A;   ESOPHAGOGASTRODUODENOSCOPY     ESOPHAGOGASTRODUODENOSCOPY (EGD) WITH PROPOFOL N/A 02/09/2020   Procedure: ESOPHAGOGASTRODUODENOSCOPY (EGD) WITH PROPOFOL;  Surgeon: Toledo, Benay Pike, MD;  Location: [facility] ENDOSCOPY;   Service: Gastroenterology;  Laterality: N/A;   HERNIA REPAIR     JOINT REPLACEMENT     TKR   PROSTATE SURGERY     REPLACEMENT TOTAL KNEE BILATERAL Bilateral    RIGHT/LEFT HEART CATH AND CORONARY ANGIOGRAPHY N/A 12/31/2017   Procedure: RIGHT/LEFT HEART CATH AND CORONARY ANGIOGRAPHY;  Surgeon: Nelva Bush, MD;  Location: [facility];  Service: Cardiovascular;  Laterality: N/A;    Home Medications:  Allergies as of 02/26/2023       Reactions   Lamisil [terbinafine] Itching, Rash        Medication List        Accurate as of February 26, 2023  4:48 PM. If you have any questions, ask your nurse or doctor.          STOP taking these medications    predniSONE 20 MG tablet Commonly known as: DELTASONE Stopped by: Hollice Espy, MD       TAKE these medications    aspirin EC 81 MG tablet Take 81 mg by mouth daily.   atorvastatin 40 MG tablet Commonly known as: LIPITOR TAKE 1 TABLET DAILY   azithromycin 250 MG tablet Commonly known as: ZITHROMAX Take 250 mg by mouth as directed.   carvedilol 6.25 MG tablet Commonly known as: COREG TAKE 1 TABLET TWICE A DAY   Cyanocobalamin 3000 MCG Subl Place 3,000 mcg under the tongue once a week.   DUPIXENT [place] Inject 300 mg into the skin every 14 (fourteen) days.   Entresto 24-26 MG Generic drug: sacubitril-valsartan Take 1 tablet by mouth 2 (two) times daily.   famotidine 40 MG tablet Commonly known as: PEPCID Take 40 mg by mouth daily.   folic acid 1 MG tablet Commonly known as: FOLVITE Take 1 mg by mouth daily.   furosemide 20 MG tablet Commonly known as: LASIX TAKE 1 TABLET DAILY   HYDROcodone bit-homatropine 5-1.5 MG/5ML syrup Commonly known as: HYCODAN Take 5 mLs by mouth every 6 (six) hours as needed.   pantoprazole 40 MG tablet Commonly known as: PROTONIX Take 40 mg by mouth 2 (two) times daily.   sildenafil 100 MG tablet Commonly known as: VIAGRA Take 1 tablet (100 mg total) by mouth daily  as needed for erectile dysfunction. Started by: Hollice Espy, MD   spironolactone 25 MG tablet Commonly known as: ALDACTONE TAKE 1 TABLET DAILY   tadalafil 10 MG tablet Commonly known as: CIALIS Take 10 mg by mouth daily as needed for erectile dysfunction.        Allergies:  Allergies  Allergen Reactions   Lamisil [Terbinafine] Itching and Rash    Family History: Family History  Problem Relation Age of Onset   Brain cancer Mother     Social History:  reports that he quit smoking about 20 years ago. His smoking use included cigarettes. He has a 11.00 pack-year smoking history. He has  never used smokeless tobacco. He reports current alcohol use. He reports that he does not use drugs.   Physical Exam: BP 113/71   Pulse 88   Ht '5\' 10"'$  (1.778 m)   Wt 176 lb 4 oz (79.9 kg)   BMI 25.29 kg/m   Constitutional:  Alert and oriented, No acute distress. HEENT: [place] AT, moist mucus membranes.  Trachea midline, no masses. Neurologic: Grossly intact, no focal deficits, moving all 4 extremities. Psychiatric: Normal mood and affect.  Assessment & Plan:    Erectile dysfunction - We discussed the pathophysiology of erectile dysfunction today along with possible contributing factors. Discussed possible treatment options including PDE 5 inhibitors, vacuum erectile device, intracavernosal injection, MUSE, and placement of the inflatable or malleable penile prosthesis for refractory cases.  In terms of PDE 5 inhibitors, we discussed contraindications for this medication as well as common side effects. Patient was counseled on optimal use. All of his questions were answered in detail.  - Not responsive to tadalafil 10 mg. Plan to trial a higher dose of a phosphodiesterase-5 inhibitor, specifically Sildenafil 100 mg, to be taken at least an hour before anticipated sexual activity, on an empty stomach. - He is to report back on the efficacy of the treatment and his interest in exploring other  options if needed.  Return if symptoms worsen or fail to improve.   [place] [address] [place],  [address] [phone]

## 2023-04-16 ENCOUNTER — Other Ambulatory Visit: Payer: Self-pay | Admitting: Internal Medicine

## 2023-05-21 NOTE — Unmapped (Signed)
Call from [employer] and LVM on nurse line that patient requests syringes for Dupixent.  New Rx should be sent to them with the syringes if OK.

## 2023-05-23 ENCOUNTER — Other Ambulatory Visit: Payer: Self-pay | Admitting: Internal Medicine

## 2023-05-25 DIAGNOSIS — L2084 Intrinsic (allergic) eczema: Principal | ICD-10-CM

## 2023-05-25 MED ORDER — DUPILUMAB 300 MG/2 ML SUBCUTANEOUS PEN INJECTOR
6 refills | 0 days | Status: CP
Start: 2023-05-25 — End: ?

## 2023-05-26 NOTE — Unmapped (Signed)
You're welcome!

## 2023-05-26 NOTE — Unmapped (Signed)
Appointment on 09/03 with Dr. Nevin Bloodgood,  Toni Amend

## 2023-06-30 ENCOUNTER — Other Ambulatory Visit: Payer: Self-pay | Admitting: Internal Medicine

## 2023-07-29 DIAGNOSIS — R739 Hyperglycemia, unspecified: Secondary | ICD-10-CM | POA: Diagnosis not present

## 2023-07-29 DIAGNOSIS — Z125 Encounter for screening for malignant neoplasm of prostate: Secondary | ICD-10-CM | POA: Diagnosis not present

## 2023-07-29 DIAGNOSIS — E782 Mixed hyperlipidemia: Secondary | ICD-10-CM | POA: Diagnosis not present

## 2023-07-29 DIAGNOSIS — D510 Vitamin B12 deficiency anemia due to intrinsic factor deficiency: Secondary | ICD-10-CM | POA: Diagnosis not present

## 2023-08-04 ENCOUNTER — Other Ambulatory Visit: Payer: Self-pay

## 2023-08-04 MED ORDER — FUROSEMIDE 20 MG PO TABS: 20.0000 mg | ORAL_TABLET | Freq: Every day | ORAL | 0 refills | Status: DC

## 2023-08-04 MED ORDER — CARVEDILOL 6.25 MG PO TABS: 6.2500 mg | ORAL_TABLET | Freq: Two times a day (BID) | ORAL | 0 refills | Status: DC

## 2023-08-04 MED ORDER — ATORVASTATIN CALCIUM 40 MG PO TABS: 40.0000 mg | ORAL_TABLET | Freq: Every day | ORAL | 0 refills | Status: DC

## 2023-08-04 NOTE — Telephone Encounter (Signed)
Please contact pt to schedule 6 month f/u.  Thanks!

## 2023-08-04 NOTE — Telephone Encounter (Signed)
Requested Prescriptions   Signed Prescriptions Disp Refills   furosemide (LASIX) 20 MG tablet 90 tablet 0    Sig: Take 1 tablet (20 mg total) by mouth daily. PLEASE CALL OFFICE TO SCHEDULE APPOINTMENT PRIOR TO NEXT REFILL    Authorizing Provider: END, CHRISTOPHER    Ordering User: Simon Llamas L   carvedilol (COREG) 6.25 MG tablet 180 tablet 0    Sig: Take 1 tablet (6.25 mg total) by mouth 2 (two) times daily. PLEASE CALL OFFICE TO SCHEDULE APPOINTMENT PRIOR TO NEXT REFILL    Authorizing Provider: END, CHRISTOPHER    Ordering User: Maeven Mcdougall L   atorvastatin (LIPITOR) 40 MG tablet 90 tablet 0    Sig: Take 1 tablet (40 mg total) by mouth daily. PLEASE CALL OFFICE TO SCHEDULE APPOINTMENT PRIOR TO NEXT REFILL    Authorizing Provider: END, CHRISTOPHER    Ordering User: Guerry Minors

## 2023-08-05 DIAGNOSIS — J449 Chronic obstructive pulmonary disease, unspecified: Secondary | ICD-10-CM | POA: Diagnosis not present

## 2023-08-05 DIAGNOSIS — Z0000 Encounter for general adult medical examination without abnormal findings: Secondary | ICD-10-CM | POA: Diagnosis not present

## 2023-08-05 DIAGNOSIS — I429 Cardiomyopathy, unspecified: Secondary | ICD-10-CM | POA: Diagnosis not present

## 2023-08-05 DIAGNOSIS — R739 Hyperglycemia, unspecified: Secondary | ICD-10-CM | POA: Diagnosis not present

## 2023-08-14 ENCOUNTER — Telehealth: Payer: Self-pay | Admitting: Internal Medicine

## 2023-08-14 NOTE — Telephone Encounter (Signed)
Patient is dropping off a packet of insurance information that needs to be completed by Dr End. Placed in nurse box

## 2023-08-15 NOTE — Telephone Encounter (Signed)
Forms placed in MD's folder for review

## 2023-08-15 NOTE — Telephone Encounter (Signed)
Forms completed and placed up front for faxing. Pt made aware.

## 2023-08-26 ENCOUNTER — Ambulatory Visit: Admit: 2023-08-26 | Discharge: 2023-08-27 | Payer: MEDICARE

## 2023-08-26 DIAGNOSIS — L2084 Intrinsic (allergic) eczema: Principal | ICD-10-CM

## 2023-08-26 MED ORDER — DUPILUMAB 300 MG/2 ML SUBCUTANEOUS PEN INJECTOR
SUBCUTANEOUS | 3 refills | 0.00000 days | Status: CP
Start: 2023-08-26 — End: ?

## 2023-08-26 NOTE — Unmapped (Signed)
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 Was out of medication for a time and had flare while off medication  - Educated; reviewed chronic waxing and waning nature  - Not having to use topical steroids at this time, refilled triamcinolone ointment 0.1%  - Refilled dupilumab 300 mg/2 mL PnIj; Inject 300 mg (1 pen) every other week.Patient not experiencing dry eye or side effects from medication    The patient was advised to call for an appointment should any new, changing, or symptomatic lesions develop.     RTC: Return in about 9 months (around 05/25/2024). or sooner as needed   _________________________________________________________________      Chief Complaint     Chief Complaint   Patient presents with    Skin Check     Pt is here today for evaluation of skin check     Duration:1 year  Location:legs  Current LK:GMWN  Status:unchanged    Symptoms:Local-itching overlying the lesion General-none          HPI     Donald Sanchez is a 82 y.o. male who presents as a returning patient (last seen 01/29/2022) to Dermatology for follow up of atopic dermatitis. At last visit patient continued on dupixent 300 mg every 14 days.     Today he reports the following:  - doing well  - eczema much improved on dupixent, no itching today  - not having to use topicals  - no injection site reactions or conjunctivitis     The patient denies any other new or changing lesions or areas of concern.     Pertinent Past Medical History     No history of skin cancer    Problem List          Musculoskeletal and Integument    Intrinsic atopic dermatitis - Primary     Prior treatments include clobetasol, triamcinolone, betamethasone, methotrexate, nbUVB  We have also treated him empirically with permethrin and ivermectin. Currently improved on dupilumab.  The medication that historically has been most helpful for him is prednisone although he has a strong contraindication to this given his cardiac status with a history of acute on chronic congestive heart failure.    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    We had talked him previously about doing extended patch testing but he has had True testing prior to seeing [place].    His last lab work with [place] was in 2019.  His differential with [place] at that time did not show an elevation in eosinophils.          Relevant Medications    dupilumab 300 mg/2 mL PnIj       Past Medical History, Family History, Social History, Medication List, Allergies, and Problem List were reviewed in the rooming section of Epic.     ROS: Other than symptoms mentioned in the HPI, no fevers, chills, or other skin complaints    Physical Examination     GENERAL: Well-appearing male in no acute distress, resting comfortably.  NEURO: Alert and oriented, answers questions appropriately  PSYCH: Normal mood and affect  RESP: No increased work of breathing  SKIN (Focal Skin Exam): Per patient request, examination of extremities was performed  - No active areas of eczema today, diffuse xerosis     All areas  not commented on are within normal limits or unremarkable      (Approved Template 09/04/2020)

## 2023-08-29 ENCOUNTER — Encounter: Payer: Self-pay | Admitting: Cardiology

## 2023-08-29 ENCOUNTER — Ambulatory Visit: Payer: Medicare HMO | Attending: Cardiology | Admitting: Cardiology

## 2023-08-29 VITALS — BP 108/60 | HR 69 | Ht 70.0 in | Wt 181.2 lb

## 2023-08-29 DIAGNOSIS — I428 Other cardiomyopathies: Secondary | ICD-10-CM | POA: Diagnosis not present

## 2023-08-29 DIAGNOSIS — E785 Hyperlipidemia, unspecified: Secondary | ICD-10-CM

## 2023-08-29 DIAGNOSIS — I2510 Atherosclerotic heart disease of native coronary artery without angina pectoris: Secondary | ICD-10-CM | POA: Diagnosis not present

## 2023-08-29 DIAGNOSIS — I5022 Chronic systolic (congestive) heart failure: Secondary | ICD-10-CM | POA: Diagnosis not present

## 2023-08-29 NOTE — Patient Instructions (Signed)
Medication Instructions:  Your physician recommends that you continue on your current medications as directed. Please refer to the Current Medication list given to you today.  *If you need a refill on your cardiac medications before your next appointment, please call your pharmacy*   Lab Work: none If you have labs (blood work) drawn today and your tests are completely normal, you will receive your results only by: MyChart Message (if you have MyChart) OR A paper copy in the mail If you have any lab test that is abnormal or we need to change your treatment, we will call you to review the results.   Testing/Procedures: none   Follow-Up: At [facility], you and your health needs are our priority.  As part of our continuing mission to provide you with exceptional heart care, we have created designated Provider Care Teams.  These Care Teams include your primary Cardiologist (physician) and Advanced Practice Providers (APPs -  Physician Assistants and Nurse Practitioners) who all work together to provide you with the care you need, when you need it.  We recommend signing up for the patient portal called "MyChart".  Sign up information is provided on this After Visit Summary.  MyChart is used to connect with patients for Virtual Visits (Telemedicine).  Patients are able to view lab/test results, encounter notes, upcoming appointments, etc.  Non-urgent messages can be sent to your provider as well.   To learn more about what you can do with MyChart, go to [email or web address].    Your next appointment:   6 month(s)  Provider:   Yvonne Kendall, MD or Charlsie Quest, NP

## 2023-08-29 NOTE — Progress Notes (Signed)
Cardiology Office Note:  .   Date:  08/29/2023  ID:  Earl Hays, DOB 1941-11-28, MRN [id number] PCP: Danella Penton, MD  [facility] Providers Cardiologist:  Yvonne Kendall, MD    History of Present Illness: .   Earl Hays is a 82 y.o. male with a past medical history of chronic HFrEF due to nonischemic cardiomyopathy, nonobstructive CAD, esophageal varices with upper GI bleed, recurrent epistaxis, and eczema, who is here today for follow-up.  Left heart cath completed 12/2017 revealed mild irregularities in the left main, LAD 40% ostial stenosis 20% mid stenosis, ramus intermedius was 60% stenosis left circumflex 20% mid stenosis RCA was large and normal.  Previous echocardiogram completed in 01/2020 revealed an LVEF that had improved from 45-50%.  He was last seen in clinic 02/05/2023 by Dr. Okey Dupre.  At that time he was doing well other than a dry cough that started approximately 2 to 3 weeks ago and was currently on antibiotic therapy with azithromycin.  GDMT escalation was deferred due to soft blood pressures.  No further testing was ordered and over-the-counter medication changes were made at that time.  He returns to clinic today stating that he has been doing well from a cardiac perspective.  Denies any chest pain, shortness of breath, palpitations, peripheral edema, or cough.  Continues to have his primary care provided by Dr. Bethann Punches.  Recently had his labs done back in August.  Has been compliant with his medications and requires no refills today.  He denies any hospitalizations or visits to the emergency department.  ROS: 10 point review of systems has been reviewed and considered negative with exception of what is been listed in the HPI  Studies Reviewed: [place] Kitchen   EKG Interpretation Date/Time:  Friday August 29 2023 14:25:43 EDT Ventricular Rate:  69 PR Interval:  124 QRS Duration:  84 QT Interval:  386 QTC Calculation: 413 R Axis:   -25  Text Interpretation: Sinus  rhythm with Premature atrial complexes Nonspecific T wave abnormality When compared with ECG of 29-Dec-2017 21:08, PREVIOUS ECG IS PRESENT Confirmed by Charlsie Quest ([id number]) on 08/29/2023 2:34:47 PM   TTE 01/28/2020 1. Left ventricular ejection fraction, by visual estimation, is 45 to  50%. The left ventricle has mildly decreased function. There is no left  ventricular hypertrophy.   2. Left ventricular diastolic parameters are consistent with Grade I  diastolic dysfunction (impaired relaxation).   3. The left ventricle demonstrates global hypokinesis.   4. Global right ventricle has normal systolic function.The right  ventricular size is normal. No increase in right ventricular wall  thickness.   5. Left atrial size was normal.   6. Normal pulmonary artery systolic pressure.  Risk Assessment/Calculations:             Physical Exam:   VS:  BP 108/60 (BP Location: Left Arm, Patient Position: Sitting, Cuff Size: Normal)   Pulse 69   Ht 5\' 10"  (1.778 m)   Wt 181 lb 3.2 oz (82.2 kg)   SpO2 96%   BMI 26.00 kg/m    Wt Readings from Last 3 Encounters:  08/29/23 181 lb 3.2 oz (82.2 kg)  02/26/23 176 lb 4 oz (79.9 kg)  02/05/23 180 lb (81.6 kg)    GEN: Well nourished, well developed in no acute distress NECK: No JVD; No carotid bruits CARDIAC: RRR, no murmurs, rubs, gallops RESPIRATORY:  Clear to auscultation without rales, wheezing or rhonchi  ABDOMEN: Soft, non-tender, non-distended EXTREMITIES:  No  edema; No deformity   ASSESSMENT AND Hays: .   Coronary artery disease with prior catheterization in 2019 showed nonobstructive coronary disease.  Continues to deny anginal or anginal equivalent symptoms he is continued on aspirin and atorvastatin for prevention of disease progression.  EKG today reveals sinus rhythm with occasional PACs without any ischemic changes noted.  Chronic HFrEF due to nonischemic cardiomyopathy.  He continues to do well with NYHA class I symptoms.  LVEF on last  echocardiogram revealed 45-50%, G1 DD, without valvular abnormalities noted.  He has some tolerating his current regimen of GDMT.  He is continued on carvedilol 6.25 mg twice daily, furosemide 20 mg daily, Entresto 24/26 mg twice daily and spironolactone 25 mg daily.  Will not add SGLT2 inhibitor to his regimen today due to low normal blood pressures.  Also consider repeating echocardiogram on return visit for an updated study.  Mixed hyperlipidemia with an LDL of 70.  He continues to remain on atorvastatin 40 mg daily.  This continues to be monitored by his PCP.       Dispo: Patient to return to clinic to see MD/APP in 6 months or sooner if needed  Signed, Carrigan Delafuente, NP

## 2023-09-04 DIAGNOSIS — H353131 Nonexudative age-related macular degeneration, bilateral, early dry stage: Secondary | ICD-10-CM | POA: Diagnosis not present

## 2023-09-04 DIAGNOSIS — H40003 Preglaucoma, unspecified, bilateral: Secondary | ICD-10-CM | POA: Diagnosis not present

## 2023-10-16 ENCOUNTER — Other Ambulatory Visit: Payer: Self-pay | Admitting: Internal Medicine

## 2023-10-30 ENCOUNTER — Telehealth: Payer: Self-pay | Admitting: Internal Medicine

## 2023-10-30 NOTE — Telephone Encounter (Signed)
Patient dropped off PAF for Entresto, placed in [employer] box.

## 2023-11-03 NOTE — Telephone Encounter (Signed)
Pending patient signature.

## 2023-11-04 NOTE — Telephone Encounter (Signed)
Spoke with patient and reviewed information needed to file for [facility] patient assistance. He only gets [employer] and does not have the annual statement that shows proof of income. Requested that he have the bank give him a list of the deposits from social security so that we can submit the application. Also reviewed that he will need to apply for Low subsidy income to see if he qualifies for that program as well. He will come by office one day in order to bring in the documentation and so I can assist with his application for [employer]. He had no further questions at this time.

## 2023-12-09 ENCOUNTER — Ambulatory Visit: Admit: 2023-12-09 | Discharge: 2023-12-10 | Payer: MEDICARE

## 2023-12-09 DIAGNOSIS — L2084 Intrinsic (allergic) eczema: Principal | ICD-10-CM

## 2023-12-09 MED ORDER — DUPILUMAB 300 MG/2 ML SUBCUTANEOUS PEN INJECTOR
3 refills | 0.00 days | Status: CP
Start: 2023-12-09 — End: ?

## 2023-12-09 MED ORDER — CLOBETASOL 0.05 % TOPICAL OINTMENT
Freq: Two times a day (BID) | TOPICAL | 5 refills | 0.00 days | Status: CP
Start: 2023-12-09 — End: 2024-12-08

## 2023-12-09 NOTE — Unmapped (Signed)
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 Was out of medication for a time and had flare while off medication  - Educated; reviewed chronic waxing and waning nature  - Patient having recurrent pruritus without rash of bilateral lower extremities and upper back; no history of diabetes, no cervical or lumbar trauma  - Refilled dupilumab 300 mg/2 mL PnIj; Inject 300 mg (1 pen) every other week.Patient not experiencing dry eye or side effects from medication  - Start clobetasol ointment BID to pruritic areas   - Recommended good use of emollients; handout provided  - Patient without history of diabetes, but can consider low dose gabapentin in the future; recommended patient talk to PCP  - Recommended elevation of legs and compression stockings, information provided in AVS    The patient was advised to call for an appointment should any new, changing, or symptomatic lesions develop.     RTC: Return in about 6 months (around 06/08/2024). or sooner as needed   _________________________________________________________________      Chief Complaint     Chief Complaint   Patient presents with    Itching     Legs and back  itching X 1 month        HPI     Donald Sanchez is a 82 y.o. male who presents as a returning patient (last seen 08/26/2023) for follow up of atopic dermatitis on dupixent. At [facility], patient was continued on dupixent 300mg  every 2 weeks.     Today, reports:  - Having significant itching of bilateral lower extremities and upper back, but no rash. Not currently using topical steroids or emollients  - Questioning if he should have a change in therapy    The patient denies any other new or changing lesions or areas of concern.     Pertinent Past Medical History     Problem List          Musculoskeletal and Integument    Intrinsic atopic dermatitis - Primary    Prior treatments include clobetasol, triamcinolone, betamethasone, methotrexate, nbUVB  We have also treated him empirically with permethrin and ivermectin. Currently improved on dupilumab.  The medication that historically has been most helpful for him is prednisone although he has a strong contraindication to this given his cardiac status with a history of acute on chronic congestive heart failure.    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    We had talked him previously about doing extended patch testing but he has had True testing prior to seeing [place].    His last lab work with [place] was in 2019.  His differential with [place] at that time did not show an elevation in eosinophils.          Relevant Medications    clobetasol (TEMOVATE) 0.05 % ointment    dupilumab 300 mg/2 mL PnIj       Past Medical History, Family History, Social History, Medication List, Allergies, and Problem List were reviewed in the rooming section of Epic.     ROS: Other than symptoms mentioned in the HPI, no fevers, chills, or other skin complaints    Physical Examination     GENERAL: Well-appearing male in no acute distress, resting comfortably.  NEURO: Alert and oriented, answers questions appropriately  PSYCH: Normal  mood and affect  EYES: lids clear, conjunctiva pink, no scleral icterus or other color change  RESP: No increased work of breathing  SKIN: Examination of the face, chest, back, bilateral upper extremities, bilateral lower extremities, hands, and feet was performed  - Bilateral lower extremities with hyperpigmentation and mild pitting, no appreciable eczematous rash; upper back with xerosis    All areas not commented on are within normal limits or unremarkable    (Approved Template 09/04/2020)

## 2023-12-09 NOTE — Unmapped (Addendum)
Meet your team:     Your nurse is: Coralee North    Please remember to fill out the survey you will receive after your visit. Your comments help [place] continue to improve our care.      Thanks in advance!      [facility] Clinical Staff      It was nice to see you today! Your resident physician was Dr. Helmut Muster     If any of your medications are too expensive, look for a coupon at [email or web address] or reference the application on a smartphone  - Enter the medication name, size, and your zip code to find coupons for local pharmacies.   - Print a coupon and bring it to the pharmacy, or pull up the coupon on a smartphone.  - You can also call pharmacies to ask about the cost of your medication before you pick it up.  If you still cannot afford your medication, please let us know.     Please call our clinic at [phone] with any concerns or to schedule a follow up appointment. We look forward to seeing you again!     [facility] releases most results to you as soon as they are available. Therefore, you may see some results before we do. Please give [place] 2 business days to review the tests and contact you by phone or through MyChart. If you are concerned that some results may be upsetting or confusing, you may wish to wait until we contact you before looking at the report in MyChart. If you have an urgent question, you can send [place] a message or call our clinic. Otherwise, we prefer that you wait 2 business days for [place] to contact you.     Moisturizers  Apply a moisturizer to your skin at least once a day (even if you do not bathe).    Cool moisturizers on your skin help with itching (to accomplish this, place your moisturizers and medicated creams in the refrigerator).  In general, people with dry skin need a moisturizer that is scooped and not squirted.    - Ointments (petrolatum ointment): greasy, but are the best moisturizers  Vaseline, Aquaphor    - Creams (thick, white cream that comes in a jar and is scooped with your hand)  Cerave, Cetaphil, Eucerin, Vanicream    - Lotions (comes in a pump) is the weakest moisturizer but is an acceptable choice for the face if you have an oily face  Cetaphil, Cerave, Curel, Neutrogena, Lubriderm, Aveeno      Consider low dose gabapentin in future given localization of itch; please talk to your primary care provider about this    recommend over the counter compression hose, 15-20 mm of Hg, knee high. These can be bought either online or at any pharmacy, no prescription needed (often cheaper online without a prescription). I have heard that www.[employer].com has good customer service and you can call and they will help you with sizing. If a prescription would make them cheaper for you (call insurance to check), I am happy to provide that as well.     consider Zippered Compression Stockings (available at multiple sites, including [employer].com) as they   may be easier to put on

## 2023-12-11 DIAGNOSIS — J209 Acute bronchitis, unspecified: Secondary | ICD-10-CM | POA: Diagnosis not present

## 2024-01-01 NOTE — Unmapped (Signed)
Pt approved for Dupixent MyWay Patient Assistance Program through 12/22/2024.     Pt notified via MyChart message.     Letter located under media tab.

## 2024-01-22 ENCOUNTER — Telehealth: Payer: Self-pay | Admitting: Internal Medicine

## 2024-01-22 DIAGNOSIS — B351 Tinea unguium: Secondary | ICD-10-CM | POA: Diagnosis not present

## 2024-01-22 DIAGNOSIS — M79674 Pain in right toe(s): Secondary | ICD-10-CM | POA: Diagnosis not present

## 2024-01-22 DIAGNOSIS — L853 Xerosis cutis: Secondary | ICD-10-CM | POA: Diagnosis not present

## 2024-01-22 DIAGNOSIS — M79675 Pain in left toe(s): Secondary | ICD-10-CM | POA: Diagnosis not present

## 2024-01-22 NOTE — Telephone Encounter (Signed)
Patient came into office and requested for his pharmacy on file to be changed to [facility] starting 01/24/2024.

## 2024-01-22 NOTE — Telephone Encounter (Signed)
Pt's pharmacy has been updated to [facility].

## 2024-01-29 DIAGNOSIS — E782 Mixed hyperlipidemia: Secondary | ICD-10-CM | POA: Diagnosis not present

## 2024-01-29 DIAGNOSIS — R739 Hyperglycemia, unspecified: Secondary | ICD-10-CM | POA: Diagnosis not present

## 2024-01-29 DIAGNOSIS — R972 Elevated prostate specific antigen [PSA]: Secondary | ICD-10-CM | POA: Diagnosis not present

## 2024-02-04 DIAGNOSIS — D510 Vitamin B12 deficiency anemia due to intrinsic factor deficiency: Secondary | ICD-10-CM | POA: Diagnosis not present

## 2024-02-04 DIAGNOSIS — I420 Dilated cardiomyopathy: Secondary | ICD-10-CM | POA: Diagnosis not present

## 2024-02-04 DIAGNOSIS — I5023 Acute on chronic systolic (congestive) heart failure: Secondary | ICD-10-CM | POA: Diagnosis not present

## 2024-02-04 DIAGNOSIS — R972 Elevated prostate specific antigen [PSA]: Secondary | ICD-10-CM | POA: Diagnosis not present

## 2024-02-04 DIAGNOSIS — Z125 Encounter for screening for malignant neoplasm of prostate: Secondary | ICD-10-CM | POA: Diagnosis not present

## 2024-02-04 DIAGNOSIS — E782 Mixed hyperlipidemia: Secondary | ICD-10-CM | POA: Diagnosis not present

## 2024-02-04 DIAGNOSIS — J449 Chronic obstructive pulmonary disease, unspecified: Secondary | ICD-10-CM | POA: Diagnosis not present

## 2024-02-04 DIAGNOSIS — Z0000 Encounter for general adult medical examination without abnormal findings: Secondary | ICD-10-CM | POA: Diagnosis not present

## 2024-02-05 ENCOUNTER — Other Ambulatory Visit: Payer: Self-pay | Admitting: Internal Medicine

## 2024-02-05 DIAGNOSIS — R972 Elevated prostate specific antigen [PSA]: Secondary | ICD-10-CM

## 2024-02-10 ENCOUNTER — Ambulatory Visit
Admission: RE | Admit: 2024-02-10 | Discharge: 2024-02-10 | Disposition: A | Discharge status: Home / Self Care | Payer: PPO | Source: Ambulatory Visit | Attending: Internal Medicine | Admitting: Internal Medicine

## 2024-02-10 DIAGNOSIS — R972 Elevated prostate specific antigen [PSA]: Secondary | ICD-10-CM | POA: Diagnosis not present

## 2024-02-10 DIAGNOSIS — N4289 Other specified disorders of prostate: Secondary | ICD-10-CM | POA: Diagnosis not present

## 2024-02-10 DIAGNOSIS — N400 Enlarged prostate without lower urinary tract symptoms: Secondary | ICD-10-CM | POA: Diagnosis not present

## 2024-02-10 MED ORDER — GADOBUTROL 1 MMOL/ML IV SOLN
7.5000 mL | Freq: Once | INTRAVENOUS | Status: AC | PRN
  Administered 2024-02-10: 7.5 mL via INTRAVENOUS

## 2024-03-01 DIAGNOSIS — R972 Elevated prostate specific antigen [PSA]: Secondary | ICD-10-CM | POA: Diagnosis not present

## 2024-03-01 DIAGNOSIS — R350 Frequency of micturition: Secondary | ICD-10-CM | POA: Diagnosis not present

## 2024-03-01 DIAGNOSIS — N401 Enlarged prostate with lower urinary tract symptoms: Secondary | ICD-10-CM | POA: Diagnosis not present

## 2024-03-01 DIAGNOSIS — N5201 Erectile dysfunction due to arterial insufficiency: Secondary | ICD-10-CM | POA: Diagnosis not present

## 2024-03-02 DIAGNOSIS — R6889 Other general symptoms and signs: Secondary | ICD-10-CM | POA: Diagnosis not present

## 2024-03-04 DIAGNOSIS — H26493 Other secondary cataract, bilateral: Secondary | ICD-10-CM | POA: Diagnosis not present

## 2024-03-04 DIAGNOSIS — H40003 Preglaucoma, unspecified, bilateral: Secondary | ICD-10-CM | POA: Diagnosis not present

## 2024-03-04 DIAGNOSIS — H353131 Nonexudative age-related macular degeneration, bilateral, early dry stage: Secondary | ICD-10-CM | POA: Diagnosis not present

## 2024-03-04 DIAGNOSIS — H43813 Vitreous degeneration, bilateral: Secondary | ICD-10-CM | POA: Diagnosis not present

## 2024-03-22 DIAGNOSIS — R053 Chronic cough: Secondary | ICD-10-CM | POA: Diagnosis not present

## 2024-03-22 DIAGNOSIS — I420 Dilated cardiomyopathy: Secondary | ICD-10-CM | POA: Diagnosis not present

## 2024-03-22 DIAGNOSIS — J449 Chronic obstructive pulmonary disease, unspecified: Secondary | ICD-10-CM | POA: Diagnosis not present

## 2024-05-19 ENCOUNTER — Ambulatory Visit: Attending: Cardiology | Admitting: Cardiology

## 2024-05-19 ENCOUNTER — Encounter: Payer: Self-pay | Admitting: Cardiology

## 2024-05-19 ENCOUNTER — Other Ambulatory Visit: Payer: Self-pay

## 2024-05-19 ENCOUNTER — Other Ambulatory Visit (HOSPITAL_COMMUNITY): Payer: Self-pay

## 2024-05-19 VITALS — BP 106/65 | HR 78 | Ht 70.0 in | Wt 177.0 lb

## 2024-05-19 DIAGNOSIS — I5022 Chronic systolic (congestive) heart failure: Secondary | ICD-10-CM

## 2024-05-19 DIAGNOSIS — I2510 Atherosclerotic heart disease of native coronary artery without angina pectoris: Secondary | ICD-10-CM

## 2024-05-19 DIAGNOSIS — E785 Hyperlipidemia, unspecified: Secondary | ICD-10-CM | POA: Diagnosis not present

## 2024-05-19 DIAGNOSIS — I428 Other cardiomyopathies: Secondary | ICD-10-CM

## 2024-05-19 MED ORDER — TAMSULOSIN HCL 0.4 MG PO CAPS: 0.4000 mg | ORAL_CAPSULE | Freq: Every day | ORAL | Status: DC

## 2024-05-19 MED ORDER — DUPIXENT 300 MG/2ML ~~LOC~~ SOSY
300.0000 mg | PREFILLED_SYRINGE | SUBCUTANEOUS | 3 refills | Status: AC
Start: 2024-05-19 — End: 2025-05-19
  Filled 2024-05-26: qty 12, 84d supply, fill #0

## 2024-05-19 MED ORDER — TAMSULOSIN HCL 0.4 MG PO CAPS
0.4000 mg | ORAL_CAPSULE | Freq: Every day | ORAL | 3 refills | Status: DC
  Filled 2024-05-19 (×2): qty 90, 90d supply, fill #0

## 2024-05-19 NOTE — Progress Notes (Signed)
 Cardiology Office Note:  .   Date:  05/19/2024  ID:  Earl Hays, DOB September 18, 1941, MRN [id number] PCP: Sari Cunning, MD  [facility] Providers Cardiologist:  Sammy Crisp, MD    History of Present Illness: .   Earl Hays is a 83 y.o. male with a past medical history of chronic HFrEF due to nonischemic cardiomyopathy, nonobstructive coronary artery disease, esophageal varices with upper GI bleed, recurrent epistaxis, and eczema, who is here today for follow-up on his chronic HFrEF.   Left heart cath completed 12/2017 revealed mild irregularities in the left main, LAD 40% ostial stenosis 20% mid stenosis, ramus intermedius was 60% stenosis left circumflex 20% mid stenosis RCA was large and normal.  Previous echocardiogram completed in 01/2020 revealed an LVEF that had improved from 45-50%.   He was last seen in clinic 02/05/2023 by Dr. Nolan Battle.  At that time he was doing well other than a dry cough that started approximately 2 to 3 weeks ago and was currently on antibiotic therapy with azithromycin.  GDMT escalation was deferred due to soft blood pressures.  No further testing was ordered and over-the-counter medication changes were made at that time.   He was last seen in clinic 08/29/2023 stating he was doing well from a cardiac perspective.  Continues to have his primary care provider Dr. Firman Hughes.  Denies any hospitalizations or visits to the emergency department.  He was continued on GDMT with the addition of SGLT2 inhibitor due to soft blood pressures and occasional orthostatic symptoms.  He returns to clinic today stating that he has been doing well from a cardiac perspective.  He denies any chest pain, shortness of breath, peripheral edema, or claudication symptoms.  He does have issues with his eczema.  He states he has been compliant with his current medication regimen without any adverse side effects.  Denies any hospitalization or visits to the emergency department.  ROS: 10  point review of systems has been reviewed and considered negative except ones were listed in the HPI  Studies Reviewed: Aaron Aas   EKG Interpretation Date/Time:  Wednesday May 19 2024 15:05:45 EDT Ventricular Rate:  84 PR Interval:  144 QRS Duration:  84 QT Interval:  360 QTC Calculation: 425 R Axis:   -29  Text Interpretation: Sinus rhythm with Premature atrial complexes Left axis deviation When compared with ECG of 29-Aug-2023 14:25, No significant change was found Confirmed by Ronald Cockayne ([id number]) on 05/19/2024 3:12:45 PM    TTE 01/28/2020 1. Left ventricular ejection fraction, by visual estimation, is 45 to  50%. The left ventricle has mildly decreased function. There is no left  ventricular hypertrophy.   2. Left ventricular diastolic parameters are consistent with Grade I  diastolic dysfunction (impaired relaxation).   3. The left ventricle demonstrates global hypokinesis.   4. Global right ventricle has normal systolic function.The right  ventricular size is normal. No increase in right ventricular wall  thickness.   5. Left atrial size was normal.   6. Normal pulmonary artery systolic pressure.   Risk Assessment/Calculations:             Physical Exam:   VS:  BP 106/65 (BP Location: Left Arm, Patient Position: Sitting, Cuff Size: Normal)   Pulse 78   Ht 5\' 10"  (1.778 m)   Wt 177 lb (80.3 kg)   SpO2 98%   BMI 25.40 kg/m    Wt Readings from Last 3 Encounters:  05/19/24 177 lb (80.3 kg)  08/29/23 181 lb 3.2 oz (82.2 kg)  02/26/23 176 lb 4 oz (79.9 kg)    GEN: Well nourished, well developed in no acute distress NECK: No JVD; No carotid bruits CARDIAC: RRR, no murmurs, rubs, gallops RESPIRATORY:  Clear to auscultation without rales, wheezing or rhonchi  ABDOMEN: Soft, non-tender, non-distended EXTREMITIES:  No edema; No deformity   ASSESSMENT AND PLAN: .   Coronary artery disease with prior heart catheterization in 2019 showed nonobstructive coronary artery disease.   Continues to deny angina or any anginal equivalent symptoms.  He is continue aspirin  81 mg daily and atorvastatin  40 mg daily.  No further ischemic workup is needed at this time.  EKG today reveals sinus rhythm with a rate of 84 with occasional PACs and left axis deviation with no significant changes compared to prior studies.  Chronic HFrEF/nonischemic cardiomyopathy.  He continues to do well with NYHA class I symptoms.  LVEF on last echocardiogram was 45 to 70%, G1 DD, without valvular abnormalities.  He has been tolerating his current regimen of GDMT.  He is continued on carvedilol  6.25 mg twice daily, furosemide  20 mg daily, Entresto  24/26 mg twice daily and spironolactone  25 mg daily.  With him having his low lower blood pressures and recently added on Flomax  by his PCP we have deferred the addition of SGLT2 inhibitor due to lightheadedness and dizziness from starting Flomax .  Will consider repeating echocardiogram on return visit.  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  He continues to remain on atorvastatin  40 mg daily.  This continues to be monitored by his PCP.       Dispo: Patient to return to clinic to see MD/APP in 6 months or sooner if needed for further evaluation  Signed, Jun Rightmyer, NP

## 2024-05-19 NOTE — Patient Instructions (Signed)
 Medication Instructions:  Your physician recommends the following medication changes.  START TAKING: Flomax 0.4mg  daily Dupixent 300mg /6ml   *If you need a refill on your cardiac medications before your next appointment, please call your pharmacy*  Lab Work: No labs ordered today  If you have labs (blood work) drawn today and your tests are completely normal, you will receive your results only by: MyChart Message (if you have MyChart) OR A paper copy in the mail If you have any lab test that is abnormal or we need to change your treatment, we will call you to review the results.  Testing/Procedures: No test ordered today   Follow-Up: At [facility], you and your health needs are our priority.  As part of our continuing mission to provide you with exceptional heart care, our providers are all part of one team.  This team includes your primary Cardiologist (physician) and Advanced Practice Providers or APPs (Physician Assistants and Nurse Practitioners) who all work together to provide you with the care you need, when you need it.  Your next appointment:   6 month(s)  Provider:   Sammy Crisp, MD or Ronald Cockayne, NP

## 2024-05-20 ENCOUNTER — Other Ambulatory Visit: Payer: Self-pay

## 2024-05-21 ENCOUNTER — Telehealth: Payer: Self-pay | Admitting: Internal Medicine

## 2024-05-21 ENCOUNTER — Other Ambulatory Visit: Payer: Self-pay

## 2024-05-21 NOTE — Telephone Encounter (Signed)
 Pt c/o medication issue:  1. Name of Medication:   dupilumab (DUPIXENT) 300 MG/2ML prefilled syringe    2. How are you currently taking this medication (dosage and times per day)?    3. Are you having a reaction (difficulty breathing--STAT)? no  4. What is your medication issue? Needing clinica information for prio auth for medication. Fax [phone]

## 2024-05-21 NOTE — Progress Notes (Signed)
 Pharmacy Patient Advocate Encounter   Received notification from Patient Pharmacy that prior authorization for Dupixent is required/requested.   Insurance verification completed.   The patient is insured through [facility] ADVANTAGE/RX ADVANCE .   Per test claim: PA required; PA submitted to above mentioned insurance via CoverMyMeds Key/confirmation #/EOC [id number] Status is pending

## 2024-05-24 ENCOUNTER — Other Ambulatory Visit: Payer: Self-pay

## 2024-05-24 ENCOUNTER — Other Ambulatory Visit (HOSPITAL_COMMUNITY): Payer: Self-pay

## 2024-05-24 NOTE — Telephone Encounter (Signed)
 I believe the Dupixent  was to be under his dermatologist.

## 2024-05-24 NOTE — Telephone Encounter (Signed)
 Checked with provider and she did not order these medications. They were only supposed to be added to medication list.

## 2024-05-24 NOTE — Telephone Encounter (Signed)
 Spoke with pharmacist - tamsulosin  and dupixant both DC by pharmacist

## 2024-05-26 ENCOUNTER — Other Ambulatory Visit: Payer: Self-pay

## 2024-05-26 NOTE — Progress Notes (Signed)
 Pharmacy Patient Advocate Encounter  Received notification from [employer] that Prior Authorization for Dupixent  has been APPROVED from 05/22/24 to 11/21/24   PA #/Case ID/Reference #: [id number]

## 2024-05-26 NOTE — Progress Notes (Signed)
 Pharmacy Patient Advocate Encounter  Insurance verification completed.   The patient is insured through [facility] ADVANTAGE/RX ADVANCE   Ran test claim for Dupixent . Co-pay is $1,187.01.  This test claim was processed through [facility]- copay amounts may vary at other pharmacies due to pharmacy/plan contracts, or as the patient moves through the different stages of their insurance plan.

## 2024-05-27 ENCOUNTER — Other Ambulatory Visit: Payer: Self-pay

## 2024-05-27 NOTE — Progress Notes (Signed)
 Patient getting directly from the manufacturer. He is enrolled in the Dupixent  MyWay PAP. Dis-enrolling.

## 2024-06-08 DIAGNOSIS — L2084 Intrinsic (allergic) eczema: Principal | ICD-10-CM

## 2024-06-08 MED ORDER — TACROLIMUS 0.1 % TOPICAL OINTMENT
Freq: Two times a day (BID) | TOPICAL | 1 refills | 30.00000 days | Status: CP
Start: 2024-06-08 — End: 2025-06-08

## 2024-06-08 MED ORDER — DUPILUMAB 300 MG/2 ML SUBCUTANEOUS PEN INJECTOR
SUBCUTANEOUS | 3 refills | 84.00000 days | Status: CP
Start: 2024-06-08 — End: ?

## 2024-06-08 NOTE — Unmapped (Signed)
 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 Was out of medication for a time and had flare while off medication  - Educated; reviewed chronic waxing and waning nature  - Patient having recurrent pruritus without rash of bilateral lower extremities and upper back; no history of diabetes, no cervical or lumbar trauma - favor contribution of stasis dermatitis with underlying eczema as etiology of continued itch  Plan:  - Refilled dupilumab 300 mg/2 mL PnIj; Inject 300 mg (1 pen) every other week. Patient not experiencing dry eye or side effects from medication  - CONTINUE clobetasol ointment BID to pruritic areas as needed on weekdays, take weekends off  - START Protopic 0.1% ointment daily Friday-Sunday to lower extremities, Clobetasol on alternate days as needed  - START Dermaleve anti-itch lotion daily as needed daily to legs, can place in fridge  - Recommended good use of emollients; handout provided  - Patient without history of diabetes, but can consider low dose gabapentin in the future; recommended patient talk to PCP  - Recommended elevation of legs and compression stockings, information provided in AVS    The patient was advised to call for an appointment should any new, changing, or symptomatic lesions develop.     RTC: Return in about 6 months (around 12/08/2024) for Recheck. or sooner as needed     Chief Complaint     Chief Complaint   Patient presents with    Intrinsic atopic dermatitis     Pt stated okay but legs are itchy        HPI     Donald Sanchez is a 83 y.o. male who presents as a returning patient (last seen 12/09/2023) [facility] for follow up of atopic dermatitis.     At last visit, patient was continued on Dupixent and started on clobetasol ointment.    Today, patient reports:  - eczema overall well-controlled, though itching has been increasing on the bilateral lower extremities  - using Vaseline daily after showering,     The patient denies any other new or changing lesions or areas of concern.     Pertinent Past Medical History     Past Medical History, Family History, Social History, Medication List, Allergies, and Problem List were reviewed in the rooming section of Epic.     ROS: Other than symptoms mentioned in the HPI, no fevers, chills, or other skin complaints.     Physical Examination     GENERAL: Well-appearing male in no acute distress, resting comfortably.  NEURO: Alert and oriented, answers questions appropriately  PSYCH: Normal mood and affect  RESP: No increased work of breathing  SKIN: Examination of the ears, face, bilateral lower extremities, hands, and sun exposed portions of upper extremities was performed    - ruddy-brown dyspigmented patches with xerosis on the bilateral lower extremities    All areas not commented on are within normal limits or unremarkable

## 2024-06-08 NOTE — Unmapped (Addendum)
 Consider using Dermaleve daily for itch. Can place in the fridge.      Meet your team:     Your intake nurse is: Bernice     Please remember to fill out the survey you will receive after your visit. Your comments help us  continue to improve our care.      Thanks in advance!      [facility] Dermatology Clinical Staff

## 2024-06-11 ENCOUNTER — Telehealth: Payer: Self-pay | Admitting: Internal Medicine

## 2024-06-11 MED ORDER — FUROSEMIDE 20 MG PO TABS: 20.0000 mg | ORAL_TABLET | Freq: Every day | ORAL | 3 refills | Status: DC

## 2024-06-11 NOTE — Telephone Encounter (Signed)
*  STAT* If patient is at the pharmacy, call can be transferred to refill team.   1. Which medications need to be refilled? (please list name of each medication and dose if known) furosemide  (LASIX ) 20 MG tablet    2. Would you like to learn more about the convenience, safety, & potential cost savings by using the [facility]?     3. Are you open to using the [facility] (Type [facility]   4. Which pharmacy/location (including street and city if local pharmacy) is medication to be sent to? [facility] - [place], [place] - [address]    5. Do they need a 30 day or 90 day supply? 90 day

## 2024-06-11 NOTE — Telephone Encounter (Signed)
 Pt's medication was sent to pt's pharmacy as requested. Confirmation received.

## 2024-06-29 ENCOUNTER — Other Ambulatory Visit
Admission: RE | Admit: 2024-06-29 | Discharge: 2024-06-29 | Disposition: A | Discharge status: Home / Self Care | Attending: Urology | Admitting: Urology

## 2024-06-29 DIAGNOSIS — R972 Elevated prostate specific antigen [PSA]: Secondary | ICD-10-CM | POA: Insufficient documentation

## 2024-06-29 LAB — URINALYSIS, ROUTINE W REFLEX MICROSCOPIC
Bilirubin Urine: NEGATIVE
Glucose, UA: NEGATIVE mg/dL
Hgb urine dipstick: NEGATIVE
Ketones, ur: NEGATIVE mg/dL
Leukocytes,Ua: NEGATIVE
Nitrite: NEGATIVE
Protein, ur: NEGATIVE mg/dL
Specific Gravity, Urine: 1.024 (ref 1.005–1.030)
pH: 5 (ref 5.0–8.0)

## 2024-06-30 LAB — FPSA% REFLEX
% FREE PSA: 21.7 %
PSA, FREE: 0.89 ng/mL

## 2024-06-30 LAB — PSA (REFLEX TO FREE) (SERIAL): Prostate Specific Ag, Serum: 4.1 ng/mL — ABNORMAL HIGH (ref 0.0–4.0)

## 2024-07-05 DIAGNOSIS — N401 Enlarged prostate with lower urinary tract symptoms: Secondary | ICD-10-CM | POA: Diagnosis not present

## 2024-07-05 DIAGNOSIS — N5201 Erectile dysfunction due to arterial insufficiency: Secondary | ICD-10-CM | POA: Diagnosis not present

## 2024-07-05 DIAGNOSIS — R3912 Poor urinary stream: Secondary | ICD-10-CM | POA: Diagnosis not present

## 2024-07-05 DIAGNOSIS — R972 Elevated prostate specific antigen [PSA]: Secondary | ICD-10-CM | POA: Diagnosis not present

## 2024-07-21 DIAGNOSIS — L2084 Intrinsic (allergic) eczema: Principal | ICD-10-CM

## 2024-07-21 MED ORDER — PREDNISONE 10 MG TABLET
ORAL_TABLET | ORAL | 0 refills | 14.00000 days | Status: CP
Start: 2024-07-21 — End: 2024-08-04

## 2024-07-21 MED ORDER — DUPILUMAB 300 MG/2 ML SUBCUTANEOUS PEN INJECTOR
SUBCUTANEOUS | 3 refills | 42.00000 days | Status: CN
Start: 2024-07-21 — End: ?

## 2024-07-21 NOTE — Unmapped (Signed)
 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 Was out of medication for a time and had flare while off medication  - Educated; reviewed chronic waxing and waning nature  - Increased itching of bilateral lower extremities with scaly plaques over bilateral shins and ankles. KOH negative for fungus today.   - Given lack of symptom control with current regimen, will continue course and add short course of oral prednisone .     Plan:  - Continue dupilumab  300 mg/2 mL PnIj; Inject 300 mg (1 pen) every other week. Patient not experiencing dry eye or side effects from medication  - Continue clobetasol  ointment BID to pruritic areas as needed on weekdays, take weekends off  - Continue Protopic  0.1% ointment daily Friday-Sunday to lower extremities, Clobetasol  on alternate days as needed  - Continue Dermaleve anti-itch lotion daily as needed daily to legs, can place in fridge  - START predniSONE  (DELTASONE ) 10 MG tablet; Take 2 tablets (20 mg total) by mouth daily for 7 days, THEN 1 tablet (10 mg total) daily for 7 days.  - Recommended good use of emollients; handout provided  - Patient without history of diabetes, but can consider low dose gabapentin in the future; recommended patient talk to PCP  - Recommended elevation of legs and compression stockings, information provided in AVS    The patient was advised to call for an appointment should any new, changing, or symptomatic lesions develop.     RTC: Return for Next scheduled follow up. or sooner as needed   _________________________________________________________________      Chief Complaint     Chief Complaint   Patient presents with    Eczema     Bilateral lower legs, patient reports worsening itching with clobetasol  ointment. Has discontinued all topicals x about one week       HPI     Donald Sanchez is a 83 y.o. male who presents as a returning patient (last seen 06/08/2024) to Dermatology for follow up of eczema.     Today patient reports:   - Has been applying clobetasol  tiwce daily to lower extremieties without any itch relief.  - Has also applied Vaseline which hasn't been helpful  - Has noticed worsening itch within the last week  - Last dose of dupixent  was on Saturday   - Interfering with sleep       The patient denies any other new or changing lesions or areas of concern.     Pertinent Past Medical History     Past Medical History, Family History, Social History, Medication List, Allergies, and Problem List were reviewed in the rooming section of Epic.     ROS: Other than symptoms mentioned in the HPI, no fevers, chills, or other skin complaints    Physical Examination     GENERAL: Well-appearing male in no acute distress, resting comfortably.  NEURO: Alert and oriented, answers questions appropriately  PSYCH: Normal mood and affect  RESP: No increased work of breathing  SKIN: Examination of the bilateral lower extremities was performed    - Eczematous scaly plaques over bilateral lower extremities, worse over ankles.     All areas not commented on are within normal limits or unremarkable      (Approved Template 09/04/2020)

## 2024-07-21 NOTE — Unmapped (Signed)
 Meet your team:    Your nurse today is: Charlett Burdock    Please remember to fill out the survey you will receive after your visit. Your comments help us  to continue to approve our care.     Thanks in advance!

## 2024-07-29 DIAGNOSIS — Z125 Encounter for screening for malignant neoplasm of prostate: Secondary | ICD-10-CM | POA: Diagnosis not present

## 2024-07-29 DIAGNOSIS — R7309 Other abnormal glucose: Secondary | ICD-10-CM | POA: Diagnosis not present

## 2024-07-29 DIAGNOSIS — E782 Mixed hyperlipidemia: Secondary | ICD-10-CM | POA: Diagnosis not present

## 2024-07-29 DIAGNOSIS — D510 Vitamin B12 deficiency anemia due to intrinsic factor deficiency: Secondary | ICD-10-CM | POA: Diagnosis not present

## 2024-08-05 ENCOUNTER — Other Ambulatory Visit: Payer: Self-pay | Admitting: Cardiology

## 2024-08-05 ENCOUNTER — Other Ambulatory Visit: Payer: Self-pay

## 2024-08-05 DIAGNOSIS — L2084 Intrinsic (allergic) eczema: Principal | ICD-10-CM

## 2024-08-05 DIAGNOSIS — Z0000 Encounter for general adult medical examination without abnormal findings: Secondary | ICD-10-CM | POA: Diagnosis not present

## 2024-08-05 DIAGNOSIS — I5023 Acute on chronic systolic (congestive) heart failure: Secondary | ICD-10-CM | POA: Diagnosis not present

## 2024-08-05 DIAGNOSIS — E782 Mixed hyperlipidemia: Secondary | ICD-10-CM | POA: Diagnosis not present

## 2024-08-05 DIAGNOSIS — R739 Hyperglycemia, unspecified: Secondary | ICD-10-CM | POA: Diagnosis not present

## 2024-08-05 DIAGNOSIS — R972 Elevated prostate specific antigen [PSA]: Secondary | ICD-10-CM | POA: Diagnosis not present

## 2024-08-05 DIAGNOSIS — D510 Vitamin B12 deficiency anemia due to intrinsic factor deficiency: Secondary | ICD-10-CM | POA: Diagnosis not present

## 2024-08-05 DIAGNOSIS — J449 Chronic obstructive pulmonary disease, unspecified: Secondary | ICD-10-CM | POA: Diagnosis not present

## 2024-08-05 MED ORDER — DUPILUMAB 300 MG/2 ML SUBCUTANEOUS PEN INJECTOR
SUBCUTANEOUS | 3 refills | 84.00000 days | Status: CP
Start: 2024-08-05 — End: ?

## 2024-08-05 MED FILL — Tamsulosin HCl Cap 0.4 MG: ORAL | 90 days supply | Qty: 90 | Fill #0 | Status: CN

## 2024-08-05 NOTE — Unmapped (Signed)
 Call from [facility]  Patient needs refills for Dupixent  .    Pended Rx for [employer].

## 2024-08-09 DIAGNOSIS — L2084 Intrinsic (allergic) eczema: Principal | ICD-10-CM

## 2024-08-09 MED ORDER — DUPILUMAB 300 MG/2 ML SUBCUTANEOUS SYRINGE
SUBCUTANEOUS | 3 refills | 28.00000 days | Status: CP
Start: 2024-08-09 — End: ?

## 2024-08-09 NOTE — Unmapped (Signed)
 Returned call to patient after he called in on nurse line.  He said his Dupixent  was changed from the syringes and that is what he wants instead of pen injector that was ordered.      Per patient, please send in the syringed to [facility] specialty pharmacy as he has been taking this way.

## 2024-08-09 NOTE — Unmapped (Signed)
 Order has been placed to include syringes and sent to patient's requested pharmacy.     Thanks!    Nilsa Stank, MD  Resident Physician  [facility] Dermat[facility]

## 2024-08-30 ENCOUNTER — Telehealth: Payer: Self-pay | Admitting: Internal Medicine

## 2024-08-30 MED ORDER — CARVEDILOL 6.25 MG PO TABS
6.2500 mg | ORAL_TABLET | Freq: Two times a day (BID) | ORAL | 2 refills | Status: AC
Start: 2024-08-30 — End: ?

## 2024-08-30 MED ORDER — ATORVASTATIN CALCIUM 40 MG PO TABS: 40.0000 mg | ORAL_TABLET | Freq: Every day | ORAL | 2 refills | Status: AC

## 2024-08-30 NOTE — Telephone Encounter (Signed)
 Pt's medications were sent to pt's pharmacy as requested. Confirmation received.

## 2024-08-30 NOTE — Telephone Encounter (Signed)
*  STAT* If patient is at the pharmacy, call can be transferred to refill team.   1. Which medications need to be refilled? (please list name of each medication and dose if known)  carvedilol  (COREG ) 6.25 MG tablet  atorvastatin  (LIPITOR) 40 MG tablet    2. Would you like to learn more about the convenience, safety, & potential cost savings by using the [facility]?      3. Are you open to using the [facility] (Type [facility]  ).   4. Which pharmacy/location (including street and city if local pharmacy) is medication to be sent to?  [facility] - [place], [place] - [address]     5. Do they need a 30 day or 90 day supply? 90 day

## 2024-09-15 DIAGNOSIS — M7022 Olecranon bursitis, left elbow: Secondary | ICD-10-CM | POA: Diagnosis not present

## 2024-09-21 DIAGNOSIS — L2084 Intrinsic (allergic) eczema: Principal | ICD-10-CM

## 2024-09-21 MED ORDER — DUPILUMAB 300 MG/2 ML SUBCUTANEOUS SYRINGE
SUBCUTANEOUS | 12 refills | 28.00000 days | Status: CP
Start: 2024-09-21 — End: ?

## 2024-09-21 NOTE — Unmapped (Signed)
 Meet your team:     Your intake nurse is: Rosalia Colonel     Please remember to fill out the survey you will receive after your visit. Your comments help us  continue to improve our care.      Thanks in advance!      [facility] Dermatology Clinical Staff

## 2024-09-21 NOTE — Unmapped (Signed)
 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 Continued good control while on dupixent .  - Educated; reviewed chronic waxing and waning nature  - Discussed the hyperpigmentation of the skin is normal after resolution of rash. Reassurance provided that discoloration will fade with time.     Plan:  - Continue dupilumab  300 mg/2 mL PnIj; Inject 300 mg (1 pen) every other week. Patient not experiencing dry eye or side effects from medication. Refilled today.  - Continue clobetasol  ointment BID to pruritic areas as needed on weekdays, take weekends off. Patient declined refills.  - Continue Protopic  0.1% ointment daily Friday-Sunday to lower extremities, Clobetasol  on alternate days as needed.  Patient declined refills.  - Continue Dermaleve anti-itch lotion daily as needed daily to legs, can place in fridge.  Patient declined refills.    The patient was advised to call for an appointment should any new, changing, or symptomatic lesions develop.     RTC: Return in about 1 year (around 09/21/2025) for atopic derm. or sooner as needed   _________________________________________________________________      Chief Complaint     Chief Complaint   Patient presents with    Intrinsic atopic dermatitis     Pt coming in for follow up for Intrinsic atopic dermatitis.  Pt states legs very itchy-doesn't feel like the medications are working.        HPI     Donald Sanchez is a 83 y.o. male who presents as a returning patient (last seen 07/21/2024) for follow up of atopic dermatitis.     Reports that his itching is significantly better. States that the dupixent  is doing well for him from an itching standpoint. He uses vaseline to moisturize regularly. Reports concern for hyperpigmentation of the skin.    The patient denies any other new or changing lesions or areas of concern.     Pertinent Past Medical History     Past Medical History, Family History, Social History, Medication List, Allergies, and Problem List were reviewed in the rooming section of Epic.     ROS: Other than symptoms mentioned in the HPI, no fevers, chills, or other skin complaints    Physical Examination     Donald: Well-appearing male in no acute distress, resting comfortably.  NEURO: Alert and oriented, answers questions appropriately  PSYCH: Normal mood and affect  EYES: lids clear, conjunctiva pink, no scleral icterus or other color change  RESP: No increased work of breathing  SKIN (Focal Skin Exam): Per patient request, examination of bilateral upper and lower extremities, face was performed  - hyperpigmented patches on the bilateral legs  - xerosis diffusely on the legs, arms, and hands    All areas not commented on are within normal limits or unremarkable    (Approved Template 09/04/2020)

## 2024-09-22 DIAGNOSIS — L2084 Intrinsic (allergic) eczema: Principal | ICD-10-CM

## 2024-09-23 DIAGNOSIS — L2084 Intrinsic (allergic) eczema: Principal | ICD-10-CM

## 2024-09-23 MED ORDER — DUPILUMAB 300 MG/2 ML SUBCUTANEOUS SYRINGE
SUBCUTANEOUS | 12 refills | 28.00000 days | Status: CP
Start: 2024-09-23 — End: ?

## 2024-11-22 DIAGNOSIS — L2084 Intrinsic (allergic) eczema: Principal | ICD-10-CM

## 2024-11-22 MED ORDER — TACROLIMUS 0.1 % TOPICAL OINTMENT
Freq: Two times a day (BID) | TOPICAL | 1 refills | 30.00000 days
Start: 2024-11-22 — End: 2025-11-22

## 2024-11-22 NOTE — Telephone Encounter (Signed)
 Fax received from pharmacy requesting refill of Tacrolimus  0.1% ointment. Last office visit 09/21/24. Medication pended.    Please advise.

## 2024-11-23 MED ORDER — TACROLIMUS 0.1 % TOPICAL OINTMENT
Freq: Two times a day (BID) | TOPICAL | 1 refills | 30.00000 days | Status: CP
Start: 2024-11-23 — End: 2025-11-23

## 2024-11-26 ENCOUNTER — Telehealth: Payer: Self-pay | Admitting: Pharmacy Technician

## 2024-11-26 ENCOUNTER — Ambulatory Visit: Attending: Cardiology | Admitting: Cardiology

## 2024-11-26 ENCOUNTER — Other Ambulatory Visit (HOSPITAL_COMMUNITY): Payer: Self-pay

## 2024-11-26 ENCOUNTER — Encounter: Payer: Self-pay | Admitting: Cardiology

## 2024-11-26 VITALS — BP 100/68 | HR 82 | Ht 70.0 in | Wt 177.4 lb

## 2024-11-26 DIAGNOSIS — Z79899 Other long term (current) drug therapy: Secondary | ICD-10-CM | POA: Diagnosis not present

## 2024-11-26 DIAGNOSIS — E782 Mixed hyperlipidemia: Secondary | ICD-10-CM | POA: Diagnosis not present

## 2024-11-26 DIAGNOSIS — I428 Other cardiomyopathies: Secondary | ICD-10-CM | POA: Diagnosis not present

## 2024-11-26 DIAGNOSIS — I5022 Chronic systolic (congestive) heart failure: Secondary | ICD-10-CM | POA: Diagnosis not present

## 2024-11-26 DIAGNOSIS — I2510 Atherosclerotic heart disease of native coronary artery without angina pectoris: Secondary | ICD-10-CM | POA: Diagnosis not present

## 2024-11-26 NOTE — Progress Notes (Signed)
 Cardiology Office Note   Date:  11/26/2024  ID:  Earl Hays 07/19/1941, MRN [id number] PCP: Cleotilde Oneil FALCON, MD  [facility] Providers Cardiologist:  Lonni Hanson, MD     History of Present Illness Earl Hays is a 83 y.o. 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   Left heart cath completed 12/2017 revealed mild irregularities in the left main, LAD 40% ostial stenosis 20% mid stenosis, ramus intermedius was 60% stenosis left circumflex 20% mid stenosis RCA was large and normal.  Previous echocardiogram completed in 01/2020 revealed an LVEF that had improved from 45-50%.   He was last seen in clinic 02/05/2023 by Dr. Hanson.  At that time he was doing well other than a dry cough that started approximately 2 to 3 weeks ago and was currently on antibiotic therapy with azithromycin.  GDMT escalation was deferred due to soft blood pressures.  No further testing was ordered and over-the-counter medication changes were made at that time.  Had previously been seen in clinic 08/29/2023 send he was doing well with cardiac perspective.  Continues to have his primary care provider and by Dr. Cleotilde.  Was continued on GDMT.   He was last seen in clinic 05/19/2024 where he stated been doing well with cardiac perspective.  Denied any shortness of breath or chest pain.  There were no medication changes that were made or further testing that was ordered at the time.   He returns to clinic today stating that he has been doing well from a cardiac perspective.  He denies any chest pain, peripheral edema, claudication symptoms.  He has several questions today related to his Entresto .  States that he has been compliant with his current medication regimen without any  undue side effects.  Denies any hospitalizations or visits to the emergency department.  ROS: 10 point review of system has been reviewed and considered negative the exception was been listed in the HPI  Studies Reviewed EKG Interpretation Date/Time:  Friday November 26 2024 14:50:00 EST Ventricular Rate:  82 PR Interval:  156 QRS Duration:  82 QT Interval:  356 QTC Calculation: 415 R Axis:   -9  Text Interpretation: Sinus rhythm with Premature supraventricular complexes When compared with ECG of 19-May-2024 15:05, No significant change was found Confirmed by Gerard Frederick ([id number]) on 11/26/2024 2:53:34 PM    TTE 01/28/2020 1. Left ventricular ejection fraction, by visual estimation, is 45 to  50%. The left ventricle has mildly decreased function. There is no left  ventricular hypertrophy.   2. Left ventricular diastolic parameters are consistent with Grade I  diastolic dysfunction (impaired relaxation).   3. The left ventricle demonstrates global hypokinesis.   4. Global right ventricle has normal systolic function.The right  ventricular size is normal. No increase in right ventricular wall  thickness.   5. Left atrial size was normal.   6. Normal pulmonary artery systolic pressure.  Risk Assessment/Calculations           Physical Exam VS:  BP 100/68 (BP Location: Left Arm, Patient Position: Sitting, Cuff Size: Normal)   Pulse 82   Ht 5' 10 (1.778 m)   Wt 177 lb 6 oz (80.5 kg)   SpO2 94%   BMI 25.45 kg/m  Wt Readings from Last 3 Encounters:  11/26/24 177 lb 6 oz (80.5 kg)  05/19/24 177 lb (80.3 kg)  08/29/23 181 lb 3.2 oz (82.2 kg)    GEN: Well nourished, well developed in no acute distress NECK: No JVD; No carotid bruits CARDIAC: RRR, no murmurs, rubs, gallops RESPIRATORY:  Clear to auscultation without rales, wheezing or rhonchi  ABDOMEN: Soft, non-tender, non-distended EXTREMITIES:  No edema; No deformity   ASSESSMENT AND PLAN Coronary disease with  prior catheterization in 2019 showing nonobstructive CAD.  He continues to deny any anginal or anginal equivalent symptoms.  He is continued on aspirin  81 mg daily and atorvastatin  40 mg daily.  EKG today reveals sinus rhythm with occasional PAC at a rate of 82 with no acute ischemic changes noted.  No further ischemic evaluation is needed at this time.  Chronic HFrEF/nonischemic cardiomyopathy we continues to state that he has been doing well.  Has NYHA class I symptoms.  Appears to be euvolemic on exam.  His last echocardiogram was done in 2021.  He has been scheduled for an updated echocardiogram.  He has been continued on carvedilol  6.25 mg twice daily, furosemide  20 mg daily, Entresto  24/26 mg twice daily and spironolactone  25 mg daily.  With his longstanding history of orthostasis and dizziness he has not been started on SGLT2 inhibitor.  Concerned about his Entresto  today as he was previously getting the medication through advanced heart failure clinic on a grant and he states that that is, 20 and then he needs assistance be unable to afford the Entresto .  Referral has been sent to Pharm.D. for assistance with options.  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  Recent LDL jumped above goal.  He has continued on atorvastatin  40 mg daily with upcoming labs with his PCP in January.  He has been advised that LDL goal is less than 70 and this continues to be monitored by his PCP.       Dispo: Patient return to clinic to see MD/APP in 6 months or sooner if needed for further evaluation he also been advised if there are changes to his echocardiogram did not need any further testing or medication changes that return appointment will be moved up.  Signed, Mairen Wallenstein, NP

## 2024-11-26 NOTE — Telephone Encounter (Signed)
   Patient Advocate Encounter   The patient was approved for a [employer] grant that will help cover the cost of entresto  Total amount awarded, 7500.  Effective: 10/27/24 - 10/26/25   APW:[id number] ERW:[id number] Hmnle:[id number] PI:[id number] [employer] ID: [id number]   Pharmacy provided with approval and processing information. Patient informed via telephone

## 2024-11-26 NOTE — Patient Instructions (Signed)
 Medication Instructions:  Your physician recommends that you continue on your current medications as directed. Please refer to the Current Medication list given to you today.   *If you need a refill on your cardiac medications before your next appointment, please call your pharmacy*  Lab Work: No labs ordered today  If you have labs (blood work) drawn today and your tests are completely normal, you will receive your results only by: MyChart Message (if you have MyChart) OR A paper copy in the mail If you have any lab test that is abnormal or we need to change your treatment, we will call you to review the results.  Testing/Procedures: Your physician has requested that you have an echocardiogram. Echocardiography is a painless test that uses sound waves to create images of your heart. It provides your doctor with information about the size and shape of your heart and how well your heart's chambers and valves are working.   You may receive an ultrasound enhancing agent through an IV if needed to better visualize your heart during the echo. This procedure takes approximately one hour.  There are no restrictions for this procedure.  This will take place at [address] [facility] [address], [place] [address]  Please note: We ask at that you not bring children with you during ultrasound (echo/ vascular) testing. Due to room size and safety concerns, children are not allowed in the ultrasound rooms during exams. Our front office staff cannot provide observation of children in our lobby area while testing is being conducted. An adult accompanying a patient to their appointment will only be allowed in the ultrasound room at the discretion of the ultrasound technician under special circumstances. We apologize for any inconvenience.   Follow-Up: At [facility], you and your health needs are our priority.  As part of our continuing mission to provide you with exceptional heart  care, our providers are all part of one team.  This team includes your primary Cardiologist (physician) and Advanced Practice Providers or APPs (Physician Assistants and Nurse Practitioners) who all work together to provide you with the care you need, when you need it.  Your next appointment:   6 month(s)  Provider:   You may see Lonni Hanson, MD or one of the following Advanced Practice Providers on your designated Care Team:   Tylene Lunch, NP  We recommend signing up for the patient portal called MyChart.  Sign up information is provided on this After Visit Summary.  MyChart is used to connect with patients for Virtual Visits (Telemedicine).  Patients are able to view lab/test results, encounter notes, upcoming appointments, etc.  Non-urgent messages can be sent to your provider as well.   To learn more about what you can do with MyChart, go to [email or web address].

## 2024-11-29 ENCOUNTER — Other Ambulatory Visit: Payer: Self-pay

## 2024-11-29 MED ORDER — SACUBITRIL-VALSARTAN 24-26 MG PO TABS: 1.0000 | ORAL_TABLET | Freq: Two times a day (BID) | ORAL | 3 refills | Status: AC

## 2024-11-29 NOTE — Addendum Note (Signed)
 Addended by: CRISTOPHER OLIVIA PARAS on: 11/29/2024 08:29 AM   Modules accepted: Orders

## 2024-11-29 NOTE — Telephone Encounter (Signed)
 [facility] called and said they do not have a prescription for entresto , can someone please send the [facility]  to [facility]? I gave them the grant information on Friday. Thank you!

## 2024-11-29 NOTE — Telephone Encounter (Signed)
 Prescription sent to preferred pharmacy

## 2024-12-07 ENCOUNTER — Telehealth: Payer: Self-pay | Admitting: Internal Medicine

## 2024-12-07 DIAGNOSIS — L853 Xerosis cutis: Principal | ICD-10-CM

## 2024-12-07 DIAGNOSIS — L2084 Intrinsic (allergic) eczema: Principal | ICD-10-CM

## 2024-12-07 DIAGNOSIS — L309 Dermatitis, unspecified: Principal | ICD-10-CM

## 2024-12-07 MED ORDER — TRIAMCINOLONE ACETONIDE 0.025 % TOPICAL OINTMENT
Freq: Two times a day (BID) | TOPICAL | 5 refills | 0.00000 days | Status: CN
Start: 2024-12-07 — End: ?

## 2024-12-07 MED ORDER — TRIAMCINOLONE ACETONIDE 0.1 % TOPICAL OINTMENT
INTRAMUSCULAR | 11 refills | 0.00000 days | Status: CP
Start: 2024-12-07 — End: ?

## 2024-12-07 NOTE — Telephone Encounter (Signed)
°*  STAT* If patient is at the pharmacy, call can be transferred to refill team.   1. Which medications need to be refilled? (please list name of each medication and dose if known)   carvedilol  (COREG ) 6.25 MG tablet     2. Would you like to learn more about the convenience, safety, & potential cost savings by using the [facility]? No    3. Are you open to using the [facility] (Type [facility].  ). No    4. Which pharmacy/location (including street and city if local pharmacy) is medication to be sent to? [facility] - [place], [place] - [address]     5. Do they need a 30 day or 90 day supply? 90 day   Pt is low on medication

## 2024-12-07 NOTE — Patient Instructions (Signed)
 Meet your team:     Your intake nurse is: Frank Pilger    Please remember to fill out the survey you will receive after your visit. Your comments help us  continue to improve our care.      Thanks in advance!      [facility] Dermatology Clinical Staff

## 2024-12-07 NOTE — Progress Notes (Signed)
 DERMATOLOGY CLINIC NOTE    ASSESSMENT AND PLAN:     Intrinsic atopic dermatitis and diffuse xerosis:  -Likely worsened right now because of the cold weather.  - triamcinolone  (KENALOG ) 0.1 % ointment; Apply liberally to skin twice daily as needed.  Dispense: 454 g; Refill: 11  -Discussed liberal use of triamcinolone  ointment including under occlusion.  -Continue Dupixent  every other week.  He should have plenty of refills.  Will try to verify with his insurance company over the telephone.  - Recommended continued good hydration and moisturization with Vaseline as well as routine humidifier.  - Discussed that I hesitate to try to increase his frequency of Dupixent  because the last time we did that it caused lapse in coverage.  We will try to get through this worsening with topicals for now.    Return to clinic: 4 months       CHIEF COMPLAINT:  Follow-up atopic dermatitis     HPI:   This is a pleasant 83 y.o. male who last saw me on 09/21/2024.  He is on Dupixent  for intrinsic atopic dermatitis.  He also does Protopic  and clobetasol  as needed.  His most recent Dupixent  prescription was on 09/23/2024.    His atopic dermatitis has flared up the last couple of weeks.  It has been very cold and dry recently.     PAST MEDICAL HISTORY:  Pt otherwise healthy.     MEDICATIONS:   Medications Ordered Prior to Encounter[1]    ALLERGIES:   Terbinafine    SOCIAL HISTORY:  Lives in [place]     REVIEW OF SYSTEMS:  Baseline state of health. No recent illnesses. No other skin complaints.     PHYSICAL EXAMINATION:  Examination in the presence of chaperone:  General: Well-developed, well-nourished. No acute distress.   Neuro: Alert and oriented, answers questions appropriately.  Skin: Examination of face, back, legs, arms was performed and notable for the following:  Diffuse xerosis with diffuse thin eczematous patches     Dictation software was used while making this note. Please excuse any errors made with dictation software. [1]   Current Outpatient Medications on File Prior to Visit   Medication Sig Dispense Refill    aspirin (ECOTRIN) 81 MG tablet Take 1 tablet (81 mg total) by mouth.      atorvastatin (LIPITOR) 20 MG tablet Take 1 tablet (20 mg total) by mouth.      carvedilol (COREG) 6.25 MG tablet Take 1 tablet (6.25 mg total) by mouth.      cod liver oil Oil Take by mouth.      docosahexaenoic acid-epa 120-180 mg cap Take by mouth.      dupilumab  (DUPIXENT ) 300 mg/2 mL syringe Inject the contents of 1 syringe (300 mg total) under the skin every fourteen (14) days. As maintenance dose 4 mL 12    FLUZONE HIGHDOSE QUAD 20-21 PF 240 mcg/0.7 mL Syrg PHARMACY ADMINISTERED      furosemide (LASIX) 20 MG tablet       furosemide (LASIX) 40 MG tablet Take 1 tablet (40 mg total) by mouth.      ivermectin  (STROMECTOL ) 3 mg Tab Take 5 tablets once. Repeat in 1 week. Wash all clothing and bedding in hot water. 10 tablet 0    losartan (COZAAR) 25 MG tablet Take 0.5 tablets (12.5 mg total) by mouth.      montelukast (SINGULAIR) 10 mg tablet       pantoprazole (PROTONIX) 40 MG tablet Take 1 tablet (40 mg  total) by mouth.      permethrin  (ELIMITE ) 5 % cream Thoroughly massage cream from head to soles; leave on for 10 hours before washing. Wash all linens and bedding in hot water. 60 g 1    sacubitril-valsartan (ENTRESTO) 24-26 mg tablet Take 1 tablet by mouth.      sildenafil (VIAGRA) 100 MG tablet Take 1 tablet (100 mg total) by mouth daily as needed.      spironolactone (ALDACTONE) 25 MG tablet Take 1 tablet (25 mg total) by mouth.      tacrolimus  (PROTOPIC ) 0.1 % ointment Apply 1 Application topically two (2) times a day. To legs Friday-Sunday 30 g 1    clobetasoL  (TEMOVATE ) 0.05 % ointment Apply topically twice daily as needed. (Patient not taking: Reported on 12/07/2024) 60 g 5    clobetasol  (TEMOVATE ) 0.05 % ointment Apply topically two (2) times a day. (Patient not taking: Reported on 12/07/2024) 60 g 5    famotidine (PEPCID) 40 MG tablet Take 40 mg by mouth.      tamsulosin (FLOMAX) 0.4 mg capsule Take 1 capsule (0.4 mg total) by mouth. (Patient not taking: Reported on 12/07/2024)      triamcinolone  (KENALOG ) 0.1 % ointment Apply twice a day to affected areas as needed. (Patient not taking: Reported on 12/07/2024) 454 g 6    triamcinolone  (KENALOG ) 0.1 % ointment Apply to rough/ itchy areas on face and thin/early rash areas on body twice a day until smooth, then stop. Restart as needed. (Patient not taking: Reported on 12/07/2024) 454 g 1     No current facility-administered medications on file prior to visit.

## 2024-12-08 MED ORDER — CARVEDILOL 6.25 MG PO TABS: 6.2500 mg | ORAL_TABLET | Freq: Two times a day (BID) | ORAL | 3 refills | Status: AC

## 2024-12-08 NOTE — Telephone Encounter (Signed)
 Refill sent

## 2024-12-10 DIAGNOSIS — L2084 Intrinsic (allergic) eczema: Principal | ICD-10-CM

## 2024-12-10 MED ORDER — DUPILUMAB 300 MG/2 ML SUBCUTANEOUS SYRINGE
SUBCUTANEOUS | 12 refills | 28.00000 days | Status: CP
Start: 2024-12-10 — End: ?

## 2024-12-10 NOTE — Telephone Encounter (Signed)
 Received fax from [facility] pharmacy services requesting new RX for Dupixent  be sent into new pharmacy.     RX T'd up with new pharmacy, routing to provider for sign off.

## 2024-12-22 ENCOUNTER — Other Ambulatory Visit
Admission: RE | Admit: 2024-12-22 | Discharge: 2024-12-22 | Disposition: A | Discharge status: Home / Self Care | Attending: Urology | Admitting: Urology

## 2024-12-22 DIAGNOSIS — R972 Elevated prostate specific antigen [PSA]: Secondary | ICD-10-CM | POA: Diagnosis present

## 2024-12-22 LAB — URINALYSIS, COMPLETE (UACMP) WITH MICROSCOPIC
Bilirubin Urine: NEGATIVE
Glucose, UA: NEGATIVE mg/dL
Hgb urine dipstick: NEGATIVE
Ketones, ur: NEGATIVE mg/dL
Leukocytes,Ua: NEGATIVE
Nitrite: NEGATIVE
Protein, ur: NEGATIVE mg/dL
Specific Gravity, Urine: 1.012 (ref 1.005–1.030)
pH: 6 (ref 5.0–8.0)

## 2024-12-24 LAB — FPSA% REFLEX
% FREE PSA: 24.8 %
PSA, FREE: 1.04 ng/mL

## 2024-12-24 LAB — PSA (REFLEX TO FREE) (SERIAL): Prostate Specific Ag, Serum: 4.2 ng/mL — ABNORMAL HIGH (ref 0.0–4.0)

## 2024-12-29 ENCOUNTER — Ambulatory Visit

## 2024-12-29 DIAGNOSIS — I5022 Chronic systolic (congestive) heart failure: Secondary | ICD-10-CM | POA: Diagnosis not present

## 2024-12-29 LAB — ECHOCARDIOGRAM COMPLETE
AR max vel: 3.52 cm2
AV Area VTI: 3.43 cm2
AV Area mean vel: 3.31 cm2
AV Mean grad: 2 mmHg
AV Peak grad: 4 mmHg
Ao pk vel: 1 m/s
Area-P 1/2: 2.56 cm2
Calc EF: 41.5 %
S' Lateral: 3 cm
Single Plane A2C EF: 44.9 %
Single Plane A4C EF: 40.5 %

## 2024-12-30 ENCOUNTER — Ambulatory Visit: Payer: Self-pay | Admitting: Cardiology

## 2025-01-18 ENCOUNTER — Other Ambulatory Visit: Payer: Self-pay | Admitting: Internal Medicine

## 2025-01-21 NOTE — Telephone Encounter (Signed)
 In accordance with refill protocols, please review and address the following requirements before this medication refill can be authorized:  Labs  Pt needs labs within normal range within 180 days pt had not done so

## 2025-01-24 ENCOUNTER — Ambulatory Visit: Admitting: Pharmacist

## 2025-01-24 ENCOUNTER — Telehealth: Payer: Self-pay | Admitting: Pharmacist
